# Patient Record
Sex: Male | Born: 1955 | Race: White | Hispanic: No | Smoking: Never smoker
Health system: Southern US, Community
[De-identification: ages and names within clinical notes are randomized; demographics above are authoritative.]

## PROBLEM LIST (undated history)

## (undated) DIAGNOSIS — N189 Chronic kidney disease, unspecified: Secondary | ICD-10-CM

## (undated) DIAGNOSIS — I2510 Atherosclerotic heart disease of native coronary artery without angina pectoris: Secondary | ICD-10-CM

## (undated) DIAGNOSIS — I10 Essential (primary) hypertension: Secondary | ICD-10-CM

## (undated) DIAGNOSIS — D649 Anemia, unspecified: Secondary | ICD-10-CM

## (undated) DIAGNOSIS — F909 Attention-deficit hyperactivity disorder, unspecified type: Secondary | ICD-10-CM

## (undated) DIAGNOSIS — G4733 Obstructive sleep apnea (adult) (pediatric): Secondary | ICD-10-CM

## (undated) DIAGNOSIS — M109 Gout, unspecified: Secondary | ICD-10-CM

## (undated) DIAGNOSIS — J984 Other disorders of lung: Secondary | ICD-10-CM

## (undated) DIAGNOSIS — M459 Ankylosing spondylitis of unspecified sites in spine: Secondary | ICD-10-CM

## (undated) DIAGNOSIS — H9190 Unspecified hearing loss, unspecified ear: Secondary | ICD-10-CM

## (undated) DIAGNOSIS — E785 Hyperlipidemia, unspecified: Secondary | ICD-10-CM

## (undated) DIAGNOSIS — I219 Acute myocardial infarction, unspecified: Secondary | ICD-10-CM

## (undated) DIAGNOSIS — M47819 Spondylosis without myelopathy or radiculopathy, site unspecified: Secondary | ICD-10-CM

## (undated) DIAGNOSIS — F32A Depression, unspecified: Secondary | ICD-10-CM

## (undated) HISTORY — PX: ANTERIOR FUSION CERVICAL SPINE: SUR626

## (undated) HISTORY — PX: KNEE ARTHROSCOPY: SHX127

## (undated) HISTORY — PX: POSTERIOR LUMBAR FUSION: SHX6036

## (undated) HISTORY — PX: TONSILLECTOMY: SUR1361

## (undated) HISTORY — PX: BACK SURGERY: SHX140

## (undated) HISTORY — PX: LAPAROSCOPIC CHOLECYSTECTOMY: SUR755

## (undated) HISTORY — PX: INGUINAL HERNIA REPAIR: SUR1180

---

## 2003-09-19 HISTORY — PX: NASAL SINUS SURGERY: SHX719

## 2015-02-08 ENCOUNTER — Other Ambulatory Visit: Payer: Self-pay | Admitting: Surgery

## 2015-03-02 NOTE — Pre-Procedure Instructions (Signed)
    Bill [name]  03/02/2015     No Pharmacies Listed   Your procedure is scheduled on Thursday, March 11, 2015 at 11:30.   Report to [facility] Admitting at 9:30 A.M.  Call this number if you have problems the morning of surgery:  [phone]   Remember:  Do not eat food or drink liquids after midnight.  Take these medicines the morning of surgery with A SIP OF WATER Amlodipine (Norvasc), Azelastine (Astelin), Loratadine (Claritin).   Stop taking Aspirin, Coumadin, Plavix, Effient, Herbal medications, Aleve, Naproxen and Ibuprofen 5 days prior to surgery.   Do not wear jewelry.  Do not wear lotions, powders, or colognes.  You may wear deodorant.  Do not shave 48 hours prior to surgery.  Men may shave face and neck.  Do not bring valuables to the hospital.  [facility] is not responsible for any belongings or valuables.  Contacts, dentures or bridgework may not be worn into surgery.  Leave your suitcase in the car.  After surgery it may be brought to your room.  For patients admitted to the hospital, discharge time will be determined by your treatment team.  Patients discharged the day of surgery will not be allowed to drive home.   Special instructions:  See attached  Please read over the following fact sheets that you were given. Pain Booklet, Coughing and Deep Breathing and Surgical Site Infection Prevention

## 2015-03-03 ENCOUNTER — Encounter (HOSPITAL_COMMUNITY)
Admission: RE | Admit: 2015-03-03 | Discharge: 2015-03-03 | Disposition: A | Discharge status: Home / Self Care | Payer: Non-veteran care | Source: Ambulatory Visit | Attending: Surgery | Admitting: Surgery

## 2015-03-03 ENCOUNTER — Encounter (HOSPITAL_COMMUNITY): Payer: Self-pay

## 2015-03-03 DIAGNOSIS — Z01818 Encounter for other preprocedural examination: Secondary | ICD-10-CM | POA: Insufficient documentation

## 2015-03-03 HISTORY — DX: Spondylosis without myelopathy or radiculopathy, site unspecified: M47.819

## 2015-03-03 HISTORY — DX: Unspecified hearing loss, unspecified ear: H91.90

## 2015-03-03 HISTORY — DX: Other disorders of lung: J98.4

## 2015-03-03 HISTORY — DX: Essential (primary) hypertension: I10

## 2015-03-03 HISTORY — DX: Attention-deficit hyperactivity disorder, unspecified type: F90.9

## 2015-03-03 LAB — BASIC METABOLIC PANEL
ANION GAP: 8 (ref 5–15)
BUN: 16 mg/dL (ref 6–20)
CHLORIDE: 100 mmol/L — AB (ref 101–111)
CO2: 29 mmol/L (ref 22–32)
CREATININE: 1.23 mg/dL (ref 0.61–1.24)
Calcium: 9.2 mg/dL (ref 8.9–10.3)
GFR calc Af Amer: >60 mL/min (ref >60)
GFR calc non Af Amer: >60 mL/min (ref >60)
Glucose, Bld: 121 mg/dL — ABNORMAL HIGH (ref 65–99)
Potassium: 4.1 mmol/L (ref 3.5–5.1)
Sodium: 137 mmol/L (ref 135–145)

## 2015-03-03 LAB — CBC
HCT: 45.9 % (ref 39.0–52.0)
HEMOGLOBIN: 15.2 g/dL (ref 13.0–17.0)
MCH: 30 pg (ref 26.0–34.0)
MCHC: 33.1 g/dL (ref 30.0–36.0)
MCV: 90.7 fL (ref 78.0–100.0)
Platelets: 320 10*3/uL (ref 150–400)
RBC: 5.06 MIL/uL (ref 4.22–5.81)
RDW: 14.2 % (ref 11.5–15.5)
WBC: 9 10*3/uL (ref 4.0–10.5)

## 2015-03-03 NOTE — Progress Notes (Signed)
PCP- [facility]  Cardiologist- denies  CXR - requested from [facility]  EKG- 03/03/2015  Echo- denies  Cardiac Cath- denies

## 2015-03-10 MED ORDER — CEFAZOLIN SODIUM 10 G IJ SOLR
3.0000 g | INTRAMUSCULAR | Status: AC
  Administered 2015-03-11: 3 g via INTRAVENOUS
  Filled 2015-03-10: qty 3000

## 2015-03-10 NOTE — H&P (Signed)
Ethel Veronica 02/08/2015 9:00 AM Location: [facility] Patient #: [id number] DOB: 04-May-1956 Married / Language: [place] / Race: White Male  History of Present Illness Riley Lam A. Magnus Ivan MD; 02/08/2015 9:22 AM) Patient words: BIH.  The patient is a 59 year old male who presents for an evaluation of a hernia. This is a very pleasant gentleman referred by Dr. Benjiman Core at the [place], [place] for evaluation of multiple hernias. He reports that he has had recurrence of his bilateral inguinal hernias which were repaired back in the late 1990s. He has also had a hernia develop at an umbilical incision after laparoscopic cholecystectomy. He has had no obstructive symptoms but is having intermittent sharp pain in both his groins with activity. He reports that the hernia is always easily reduced. He is otherwise without complaints.   Other Problems Lamar Laundry Bynum, CMA; 02/08/2015 9:00 AM) Arthritis Back Pain Cholelithiasis High blood pressure Inguinal Hernia Other disease, cancer, significant illness Sleep Apnea  Past Surgical History Gilmer Mor, CMA; 02/08/2015 9:00 AM) Gallbladder Surgery - Laparoscopic Knee Surgery Left. Laparoscopic Inguinal Hernia Surgery Bilateral. Spinal Surgery - Lower Back Spinal Surgery - Neck Tonsillectomy Vasectomy  Diagnostic Studies History Gilmer Mor, CMA; 02/08/2015 9:00 AM) Colonoscopy 1-5 years ago  Allergies Lamar Laundry Bynum, CMA; 02/08/2015 9:01 AM) No Known Drug Allergies05/23/2016  Medication History (Sonya Bynum, CMA; 02/08/2015 9:03 AM) Singulair (  Tablet, Oral) Active. Norvasc (  Tablet, Oral) Active. Methotrexate (Arthritis) (2.5MG  Tablet, Oral) Active. Zestoretic (20-12.5MG  Tablet, Oral) Active. Enbrel ( /ML Soln Pref Syr, Subcutaneous) Active. Vitamin D3 (100000 UNIT/GM Powder,) Active. Folic + B12 (800-1000MCG Tablet, Oral) Active. Loratadine (  Tablet, Oral)  Active. Azelastine HCl (0.1% Solution, Nasal) Active. Testosterone Cypionate ( /ML Solution, Intramuscular) Active. Medications Reconciled  Social History Gilmer Mor, CMA; 02/08/2015 9:00 AM) Alcohol use Occasional alcohol use. Caffeine use Carbonated beverages, Coffee, Tea. No drug use Tobacco use Never smoker.  Family History Gilmer Mor, CMA; 02/08/2015 9:00 AM) Cancer Father. Heart Disease Brother. Heart disease in male family member before age 30 Hypertension Brother, Mother.  Review of Systems (Sonya Bynum CMA; 02/08/2015 9:00 AM) General Present- Fatigue and Weight Gain. Not Present- Appetite Loss, Chills, Fever, Night Sweats and Weight Loss. Skin Not Present- Change in Wart/Mole, Dryness, Hives, Jaundice, New Lesions, Non-Healing Wounds, Rash and Ulcer. HEENT Present- Hearing Loss, Ringing in the Ears and Seasonal Allergies. Not Present- Earache, Hoarseness, Nose Bleed, Oral Ulcers, Sinus Pain, Sore Throat, Visual Disturbances, Wears glasses/contact lenses and Yellow Eyes. Respiratory Present- Snoring. Not Present- Bloody sputum, Chronic Cough, Difficulty Breathing and Wheezing. Breast Not Present- Breast Mass, Breast Pain, Nipple Discharge and Skin Changes. Cardiovascular Present- Difficulty Breathing Lying Down, Shortness of Breath and Swelling of Extremities. Not Present- Chest Pain, Leg Cramps, Palpitations and Rapid Heart Rate. Gastrointestinal Present- Difficulty Swallowing. Not Present- Abdominal Pain, Bloating, Bloody Stool, Change in Bowel Habits, Chronic diarrhea, Constipation, Excessive gas, Gets full quickly at meals, Hemorrhoids, Indigestion, Nausea, Rectal Pain and Vomiting. Male Genitourinary Not Present- Blood in Urine, Change in Urinary Stream, Frequency, Impotence, Nocturia, Painful Urination, Urgency and Urine Leakage. Musculoskeletal Present- Joint Pain. Not Present- Back Pain, Joint Stiffness, Muscle Pain, Muscle Weakness and Swelling of  Extremities. Neurological Not Present- Decreased Memory, Fainting, Headaches, Numbness, Seizures, Tingling, Tremor, Trouble walking and Weakness. Psychiatric Not Present- Anxiety, Bipolar, Change in Sleep Pattern, Depression, Fearful and Frequent crying. Endocrine Not Present- Cold Intolerance, Excessive Hunger, Hair Changes, Heat Intolerance, Hot flashes and New Diabetes. Hematology Not Present- Easy Bruising, Excessive bleeding, Gland problems, HIV and Persistent  Infections.   Vitals (Sonya Bynum CMA; 02/08/2015 9:00 AM) 02/08/2015 9:00 AM Weight: 314 lb Height: 70in Body Surface Area: 2.65 m Body Mass Index: 45.05 kg/m Temp.: 97.66F(Temporal)  Pulse: 75 (Regular)  BP: 134/78 (Sitting, Left Arm, Standard)    Physical Exam (Jozsef Wescoat A. Magnus Ivan MD; 02/08/2015 9:23 AM) General Mental Status-Alert. General Appearance-Consistent with stated age. Hydration-Well hydrated. Voice-Normal. Note: Morbidly obese   Head and Neck Head-normocephalic, atraumatic with no lesions or palpable masses. Trachea-midline.  Eye Eyeball - Bilateral-Extraocular movements intact. Sclera/Conjunctiva - Bilateral-No scleral icterus.  Chest and Lung Exam Chest and lung exam reveals -quiet, even and easy respiratory effort with no use of accessory muscles and on auscultation, normal breath sounds, no adventitious sounds and normal vocal resonance. Inspection Chest Wall - Normal. Back - normal.  Cardiovascular Cardiovascular examination reveals -normal heart sounds, regular rate and rhythm with no murmurs and normal pedal pulses bilaterally. Note: Bilateral pedal edema is present  Abdomen Inspection Skin - Scar - no surgical scars. Hernias - Ventral - Reducible. Note: The ventral hernia is a small hernia at the umbilicus through her previous scar. Inguinal hernia - Left - Reducible. Right - Reducible. Palpation/Percussion Palpation and Percussion of the abdomen reveal -  Soft, Non Tender, No Rebound tenderness, No Rigidity (guarding) and No hepatosplenomegaly. Auscultation Auscultation of the abdomen reveals - Bowel sounds normal.  Neurologic Neurologic evaluation reveals -alert and oriented x 3 with no impairment of recent or remote memory. Mental Status-Normal.  Musculoskeletal Normal Exam - Left-Upper Extremity Strength Normal and Lower Extremity Strength Normal. Normal Exam - Right-Upper Extremity Strength Normal, Lower Extremity Weakness.    Assessment & Plan (Dimarco Minkin A. 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 Repair of all hernias is recommended with mesh. I will attempt to repair the inguinal hernias laparoscopically mesh and then the incisional hernia of the umbilicus open with mesh. I discussed the risk of him in detail. These include but are not limited to bleeding, infection, injury to his riding structures, nerve entrapment, chronic pain, the need to convert to an open procedure, recurrence, DVT, cardiopulmonary issues, postoperative recovery, etc. He understands and wishes to proceed. He will need to be observed postoperatively in the hospital overnight given his restrictive lung disease

## 2015-03-11 ENCOUNTER — Encounter (HOSPITAL_COMMUNITY)
Admission: RE | Disposition: A | Discharge status: Home / Self Care | Payer: Self-pay | Source: Ambulatory Visit | Attending: Surgery

## 2015-03-11 ENCOUNTER — Ambulatory Visit (HOSPITAL_COMMUNITY): Payer: No Typology Code available for payment source | Admitting: Anesthesiology

## 2015-03-11 ENCOUNTER — Observation Stay (HOSPITAL_COMMUNITY)
Admission: RE | Admit: 2015-03-11 | Discharge: 2015-03-12 | Disposition: A | Discharge status: Home / Self Care | Payer: No Typology Code available for payment source | Source: Ambulatory Visit | Attending: Surgery | Admitting: Surgery

## 2015-03-11 ENCOUNTER — Encounter (HOSPITAL_COMMUNITY): Payer: Self-pay | Admitting: Anesthesiology

## 2015-03-11 DIAGNOSIS — G4730 Sleep apnea, unspecified: Secondary | ICD-10-CM | POA: Insufficient documentation

## 2015-03-11 DIAGNOSIS — K4020 Bilateral inguinal hernia, without obstruction or gangrene, not specified as recurrent: Secondary | ICD-10-CM | POA: Diagnosis present

## 2015-03-11 DIAGNOSIS — K4000 Bilateral inguinal hernia, with obstruction, without gangrene, not specified as recurrent: Principal | ICD-10-CM | POA: Insufficient documentation

## 2015-03-11 DIAGNOSIS — J449 Chronic obstructive pulmonary disease, unspecified: Secondary | ICD-10-CM | POA: Diagnosis not present

## 2015-03-11 DIAGNOSIS — M1990 Unspecified osteoarthritis, unspecified site: Secondary | ICD-10-CM | POA: Diagnosis not present

## 2015-03-11 DIAGNOSIS — I10 Essential (primary) hypertension: Secondary | ICD-10-CM | POA: Insufficient documentation

## 2015-03-11 DIAGNOSIS — Z6841 Body Mass Index (BMI) 40.0 and over, adult: Secondary | ICD-10-CM | POA: Insufficient documentation

## 2015-03-11 DIAGNOSIS — K432 Incisional hernia without obstruction or gangrene: Secondary | ICD-10-CM | POA: Diagnosis not present

## 2015-03-11 HISTORY — PX: HERNIA REPAIR: SHX51

## 2015-03-11 HISTORY — PX: INGUINAL HERNIA REPAIR: SHX194

## 2015-03-11 HISTORY — PX: INCISIONAL HERNIA REPAIR: SHX193

## 2015-03-11 HISTORY — DX: Obstructive sleep apnea (adult) (pediatric): G47.33

## 2015-03-11 HISTORY — DX: Ankylosing spondylitis of unspecified sites in spine: M45.9

## 2015-03-11 SURGERY — REPAIR, HERNIA, INGUINAL, BILATERAL, LAPAROSCOPIC
Anesthesia: General | Site: Abdomen

## 2015-03-11 MED ORDER — MEPERIDINE HCL 25 MG/ML IJ SOLN: 6.2500 mg | INTRAMUSCULAR | Status: DC | PRN

## 2015-03-11 MED ORDER — MIDAZOLAM HCL 5 MG/5ML IJ SOLN
INTRAMUSCULAR | Status: DC | PRN
  Administered 2015-03-11: 2 mg via INTRAVENOUS

## 2015-03-11 MED ORDER — MORPHINE SULFATE 2 MG/ML IJ SOLN
1.0000 mg | INTRAMUSCULAR | Status: DC | PRN
  Administered 2015-03-11: 4 mg via INTRAVENOUS
  Administered 2015-03-11: 2 mg via INTRAVENOUS
  Administered 2015-03-12 (×2): 4 mg via INTRAVENOUS
  Filled 2015-03-11: qty 1
  Filled 2015-03-11 (×3): qty 2

## 2015-03-11 MED ORDER — SUGAMMADEX SODIUM 200 MG/2ML IV SOLN
INTRAVENOUS | Status: DC | PRN
  Administered 2015-03-11: 200 mg via INTRAVENOUS

## 2015-03-11 MED ORDER — POTASSIUM CHLORIDE IN NACL 20-0.9 MEQ/L-% IV SOLN
INTRAVENOUS | Status: DC
Start: 2015-03-11 — End: 2015-03-12
  Administered 2015-03-11: 19:00:00 via INTRAVENOUS
  Filled 2015-03-11: qty 1000

## 2015-03-11 MED ORDER — 0.9 % SODIUM CHLORIDE (POUR BTL) OPTIME
TOPICAL | Status: DC | PRN
  Administered 2015-03-11: 1000 mL

## 2015-03-11 MED ORDER — ONDANSETRON HCL 4 MG/2ML IJ SOLN: 4.0000 mg | Freq: Four times a day (QID) | INTRAMUSCULAR | Status: DC | PRN

## 2015-03-11 MED ORDER — BUPIVACAINE HCL 0.5 % IJ SOLN
INTRAMUSCULAR | Status: AC
  Filled 2015-03-11: qty 1

## 2015-03-11 MED ORDER — ONDANSETRON HCL 4 MG/2ML IJ SOLN
INTRAMUSCULAR | Status: DC | PRN
  Administered 2015-03-11 (×2): 4 mg via INTRAVENOUS

## 2015-03-11 MED ORDER — FENTANYL CITRATE (PF) 100 MCG/2ML IJ SOLN
INTRAMUSCULAR | Status: DC | PRN
  Administered 2015-03-11: 100 ug via INTRAVENOUS
  Administered 2015-03-11: 50 ug via INTRAVENOUS

## 2015-03-11 MED ORDER — ENOXAPARIN SODIUM 40 MG/0.4ML ~~LOC~~ SOLN
40.0000 mg | SUBCUTANEOUS | Status: DC
  Filled 2015-03-11: qty 0.4

## 2015-03-11 MED ORDER — BUPIVACAINE HCL (PF) 0.25 % IJ SOLN
INTRAMUSCULAR | Status: AC
  Filled 2015-03-11: qty 30

## 2015-03-11 MED ORDER — BUPIVACAINE-EPINEPHRINE (PF) 0.25% -1:200000 IJ SOLN
INTRAMUSCULAR | Status: AC
  Filled 2015-03-11: qty 30

## 2015-03-11 MED ORDER — HYDROMORPHONE HCL 1 MG/ML IJ SOLN
INTRAMUSCULAR | Status: AC
  Filled 2015-03-11: qty 1

## 2015-03-11 MED ORDER — LIDOCAINE HCL (CARDIAC) 20 MG/ML IV SOLN
INTRAVENOUS | Status: AC
  Filled 2015-03-11: qty 5

## 2015-03-11 MED ORDER — OXYCODONE-ACETAMINOPHEN 5-325 MG PO TABS: 1.0000 | ORAL_TABLET | ORAL | Status: DC | PRN

## 2015-03-11 MED ORDER — PROPOFOL 10 MG/ML IV BOLUS
INTRAVENOUS | Status: DC | PRN
  Administered 2015-03-11: 200 mg via INTRAVENOUS

## 2015-03-11 MED ORDER — DEXAMETHASONE SODIUM PHOSPHATE 4 MG/ML IJ SOLN
INTRAMUSCULAR | Status: DC | PRN
  Administered 2015-03-11: 8 mg via INTRAVENOUS

## 2015-03-11 MED ORDER — ROCURONIUM BROMIDE 50 MG/5ML IV SOLN
INTRAVENOUS | Status: AC
  Filled 2015-03-11: qty 1

## 2015-03-11 MED ORDER — BUPIVACAINE HCL (PF) 0.5 % IJ SOLN
INTRAMUSCULAR | Status: DC | PRN
  Administered 2015-03-11: 50 mL

## 2015-03-11 MED ORDER — HYDROMORPHONE HCL 1 MG/ML IJ SOLN
0.2500 mg | INTRAMUSCULAR | Status: DC | PRN
  Administered 2015-03-11 (×2): 0.5 mg via INTRAVENOUS

## 2015-03-11 MED ORDER — PROMETHAZINE HCL 25 MG/ML IJ SOLN: 6.2500 mg | INTRAMUSCULAR | Status: DC | PRN

## 2015-03-11 MED ORDER — AZELASTINE HCL 0.1 % NA SOLN
1.0000 | Freq: Two times a day (BID) | NASAL | Status: DC
  Filled 2015-03-11: qty 30

## 2015-03-11 MED ORDER — ONDANSETRON HCL 4 MG PO TABS: 4.0000 mg | ORAL_TABLET | Freq: Four times a day (QID) | ORAL | Status: DC | PRN

## 2015-03-11 MED ORDER — LIDOCAINE HCL (CARDIAC) 20 MG/ML IV SOLN
INTRAVENOUS | Status: DC | PRN
  Administered 2015-03-11: 20 mg via INTRAVENOUS

## 2015-03-11 MED ORDER — HYDROCHLOROTHIAZIDE 12.5 MG PO CAPS: 12.5000 mg | ORAL_CAPSULE | Freq: Every day | ORAL | Status: DC

## 2015-03-11 MED ORDER — PHENYLEPHRINE HCL 10 MG/ML IJ SOLN
10.0000 mg | INTRAVENOUS | Status: DC | PRN
  Administered 2015-03-11: 20 ug/min via INTRAVENOUS

## 2015-03-11 MED ORDER — FENTANYL CITRATE (PF) 250 MCG/5ML IJ SOLN
INTRAMUSCULAR | Status: AC
  Filled 2015-03-11: qty 5

## 2015-03-11 MED ORDER — PHENYLEPHRINE HCL 10 MG/ML IJ SOLN
INTRAMUSCULAR | Status: DC | PRN
  Administered 2015-03-11: 120 ug via INTRAVENOUS
  Administered 2015-03-11 (×3): 80 ug via INTRAVENOUS

## 2015-03-11 MED ORDER — ONDANSETRON HCL 4 MG/2ML IJ SOLN
INTRAMUSCULAR | Status: AC
  Filled 2015-03-11: qty 2

## 2015-03-11 MED ORDER — MIDAZOLAM HCL 2 MG/2ML IJ SOLN
0.5000 mg | Freq: Once | INTRAMUSCULAR | Status: DC | PRN
Start: 2015-03-11 — End: 2015-03-11

## 2015-03-11 MED ORDER — PROPOFOL 10 MG/ML IV BOLUS
INTRAVENOUS | Status: AC
  Filled 2015-03-11: qty 20

## 2015-03-11 MED ORDER — MONTELUKAST SODIUM 10 MG PO TABS
10.0000 mg | ORAL_TABLET | Freq: Every day | ORAL | Status: DC
  Filled 2015-03-11: qty 1

## 2015-03-11 MED ORDER — MIDAZOLAM HCL 2 MG/2ML IJ SOLN
INTRAMUSCULAR | Status: AC
  Filled 2015-03-11: qty 2

## 2015-03-11 MED ORDER — ROCURONIUM BROMIDE 100 MG/10ML IV SOLN
INTRAVENOUS | Status: DC | PRN
  Administered 2015-03-11: 50 mg via INTRAVENOUS
  Administered 2015-03-11 (×2): 15 mg via INTRAVENOUS

## 2015-03-11 MED ORDER — LISINOPRIL-HYDROCHLOROTHIAZIDE 20-12.5 MG PO TABS: 1.0000 | ORAL_TABLET | Freq: Every day | ORAL | Status: DC

## 2015-03-11 MED ORDER — SUGAMMADEX SODIUM 200 MG/2ML IV SOLN
INTRAVENOUS | Status: AC
  Filled 2015-03-11: qty 2

## 2015-03-11 MED ORDER — LACTATED RINGERS IV SOLN
INTRAVENOUS | Status: DC
  Administered 2015-03-11 (×2): via INTRAVENOUS
  Administered 2015-03-11: 50 mL/h via INTRAVENOUS

## 2015-03-11 MED ORDER — DEXAMETHASONE SODIUM PHOSPHATE 4 MG/ML IJ SOLN
INTRAMUSCULAR | Status: AC
  Filled 2015-03-11: qty 2

## 2015-03-11 MED ORDER — AMLODIPINE BESYLATE 5 MG PO TABS
5.0000 mg | ORAL_TABLET | Freq: Every day | ORAL | Status: DC
  Administered 2015-03-11: 5 mg via ORAL
  Filled 2015-03-11: qty 1

## 2015-03-11 MED ORDER — LISINOPRIL 20 MG PO TABS: 20.0000 mg | ORAL_TABLET | Freq: Every day | ORAL | Status: DC

## 2015-03-11 SURGICAL SUPPLY — 57 items
BENZOIN TINCTURE PRP APPL 2/3 (GAUZE/BANDAGES/DRESSINGS) ×5 IMPLANT
BLADE SURG ROTATE 9660 (MISCELLANEOUS) ×5 IMPLANT
CANISTER SUCTION 2500CC (MISCELLANEOUS) ×5 IMPLANT
CHLORAPREP W/TINT 26ML (MISCELLANEOUS) ×5 IMPLANT
CLOSURE WOUND 1/2 X4 (GAUZE/BANDAGES/DRESSINGS) ×2
COVER SURGICAL LIGHT HANDLE (MISCELLANEOUS) ×5 IMPLANT
DEVICE SECURE STRAP 25 ABSORB (INSTRUMENTS) ×5 IMPLANT
DISSECT BALLN SPACEMKR + OVL (BALLOONS) ×10
DISSECTOR BALLN SPACEMKR + OVL (BALLOONS) ×6 IMPLANT
DRAIN PENROSE 1/2X12 LTX STRL (WOUND CARE) ×5 IMPLANT
DRAPE LAPAROSCOPIC ABDOMINAL (DRAPES) ×5 IMPLANT
DRAPE UTILITY XL STRL (DRAPES) ×10 IMPLANT
DRSG TEGADERM 4X4.75 (GAUZE/BANDAGES/DRESSINGS) ×5 IMPLANT
ELECT CAUTERY BLADE 6.4 (BLADE) ×5 IMPLANT
ELECT REM PT RETURN 9FT ADLT (ELECTROSURGICAL) ×5
ELECTRODE REM PT RTRN 9FT ADLT (ELECTROSURGICAL) ×3 IMPLANT
GAUZE SPONGE 4X4 12PLY STRL (GAUZE/BANDAGES/DRESSINGS) ×5 IMPLANT
GLOVE BIO SURGEON STRL SZ7.5 (GLOVE) ×10 IMPLANT
GLOVE BIOGEL PI IND STRL 7.5 (GLOVE) ×9 IMPLANT
GLOVE BIOGEL PI INDICATOR 7.5 (GLOVE) ×6
GLOVE SURG SIGNA 7.5 PF LTX (GLOVE) ×5 IMPLANT
GOWN STRL REUS W/ TWL LRG LVL3 (GOWN DISPOSABLE) ×9 IMPLANT
GOWN STRL REUS W/ TWL XL LVL3 (GOWN DISPOSABLE) ×3 IMPLANT
GOWN STRL REUS W/TWL LRG LVL3 (GOWN DISPOSABLE) ×6
GOWN STRL REUS W/TWL XL LVL3 (GOWN DISPOSABLE) ×2
KIT BASIN OR (CUSTOM PROCEDURE TRAY) ×5 IMPLANT
KIT ROOM TURNOVER OR (KITS) ×5 IMPLANT
LIQUID BAND (GAUZE/BANDAGES/DRESSINGS) ×5 IMPLANT
MESH PARIETEX PROGRIP LEFT (Mesh General) ×5 IMPLANT
MESH PARIETEX PROGRIP RIGHT (Mesh General) ×5 IMPLANT
MESH VENTRALEX ST 2.5 CRC MED (Mesh General) ×5 IMPLANT
NEEDLE HYPO 25GX1X1/2 BEV (NEEDLE) ×5 IMPLANT
NEEDLE INSUFFLATION 14GA 120MM (NEEDLE) ×5 IMPLANT
NS IRRIG 1000ML POUR BTL (IV SOLUTION) ×5 IMPLANT
PACK GENERAL/GYN (CUSTOM PROCEDURE TRAY) ×5 IMPLANT
PAD ARMBOARD 7.5X6 YLW CONV (MISCELLANEOUS) ×5 IMPLANT
PENCIL BUTTON HOLSTER BLD 10FT (ELECTRODE) ×5 IMPLANT
SET TROCAR LAP APPLE-HUNT 5MM (ENDOMECHANICALS) ×5 IMPLANT
SPONGE GAUZE 4X4 12PLY STER LF (GAUZE/BANDAGES/DRESSINGS) ×5 IMPLANT
STRIP CLOSURE SKIN 1/2X4 (GAUZE/BANDAGES/DRESSINGS) ×8 IMPLANT
SUT MNCRL AB 4-0 PS2 18 (SUTURE) ×10 IMPLANT
SUT NOVA NAB DX-16 0-1 5-0 T12 (SUTURE) ×10 IMPLANT
SUT VIC AB 2-0 CT1 27 (SUTURE) ×10
SUT VIC AB 2-0 CT1 TAPERPNT 27 (SUTURE) ×15 IMPLANT
SUT VIC AB 3-0 SH 27 (SUTURE) ×2
SUT VIC AB 3-0 SH 27XBRD (SUTURE) ×3 IMPLANT
SYR CONTROL 10ML LL (SYRINGE) ×5 IMPLANT
TAPE CLOTH SURG 4X10 WHT LF (GAUZE/BANDAGES/DRESSINGS) ×5 IMPLANT
TOWEL OR 17X24 6PK STRL BLUE (TOWEL DISPOSABLE) ×5 IMPLANT
TOWEL OR 17X26 10 PK STRL BLUE (TOWEL DISPOSABLE) ×5 IMPLANT
TRAY FOLEY CATH 14FRSI W/METER (CATHETERS) ×5 IMPLANT
TRAY FOLEY CATH 16FR SILVER (SET/KITS/TRAYS/PACK) ×5 IMPLANT
TRAY LAPAROSCOPIC (CUSTOM PROCEDURE TRAY) ×5 IMPLANT
TUBE CONNECTING 12'X1/4 (SUCTIONS) ×1
TUBE CONNECTING 12X1/4 (SUCTIONS) ×4 IMPLANT
TUBING INSUFFLATION (TUBING) ×5 IMPLANT
YANKAUER SUCT BULB TIP NO VENT (SUCTIONS) ×5 IMPLANT

## 2015-03-11 NOTE — Anesthesia Postprocedure Evaluation (Signed)
  Anesthesia Post-op Note  Patient: Bill [name]  Procedure(s) Performed: Procedure(s): LAPAROSCOPIC CONVERTED TO BILATERAL INGUINAL HERNIA REPAIR WITH MESH (Bilateral) OPEN INCISIONAL HERNIA REPAIR WITH MESH  (N/A)  Patient Location: PACU  Anesthesia Type:General  Level of Consciousness: awake, alert , oriented and patient cooperative  Airway and Oxygen Therapy: Patient Spontanous Breathing and Patient connected to nasal cannula oxygen  Post-op Pain: mild  Post-op Assessment: Post-op Vital signs reviewed, Patient's Cardiovascular Status Stable, Respiratory Function Stable, Patent Airway, No signs of Nausea or vomiting and Pain level controlled              Post-op Vital Signs: Reviewed and stable  Last Vitals:  Filed Vitals:   03/11/15 1415  BP: 110/52  Pulse: 73  Temp:   Resp: 17    Complications: No apparent anesthesia complications

## 2015-03-11 NOTE — Anesthesia Procedure Notes (Signed)
Date/Time: 03/11/2015 10:25 AM Performed by: Jeani Hawking Pre-anesthesia Checklist: Patient identified, Timeout performed, Emergency Drugs available, Suction available and Patient being monitored Patient Re-evaluated:Patient Re-evaluated prior to inductionOxygen Delivery Method: Circle system utilized Preoxygenation: Pre-oxygenation with 100% oxygen Intubation Type: IV induction Ventilation: Two handed mask ventilation required and Oral airway inserted - appropriate to patient size Laryngoscope Size: Glidescope (1st attempt with MAC 4 grade III view, GRADE II with glidescope) Grade View: Grade III Tube type: Oral Tube size: 7.5 mm Number of attempts: 3 Airway Equipment and Method: Bougie stylet,  Video-laryngoscopy and Stylet Placement Confirmation: ETT inserted through vocal cords under direct vision,  breath sounds checked- equal and bilateral,  positive ETCO2 and CO2 detector Secured at: 23 cm Tube secured with: Tape Dental Injury: Teeth and Oropharynx as per pre-operative assessment  Difficulty Due To: Difficulty was unanticipated, Difficult Airway- due to immobile epiglottis and Difficult Airway- due to reduced neck mobility Comments: Grade III view with MAC 3, Dr. Jean Rosenthal attempted with MAC 3 and Blue bougie, unable to pass. ETT placed on 3rd attempt by Dr. Jean Rosenthal with glidescope. Easy mask with oral airway and 2 hand mask.tb

## 2015-03-11 NOTE — Anesthesia Preprocedure Evaluation (Addendum)
Anesthesia Evaluation  Patient identified by MRN, date of birth, ID band Patient awake    Reviewed: Allergy & Precautions, NPO status , Patient's Chart, lab work & pertinent test results  History of Anesthesia Complications Negative for: history of anesthetic complications  Airway Mallampati: II  TM Distance: >3 FB Neck ROM: Full    Dental  (+) Dental Advisory Given   Pulmonary sleep apnea and Continuous Positive Airway Pressure Ventilation , COPD COPD inhaler,  breath sounds clear to auscultation        Cardiovascular hypertension, Pt. on medications - anginaRhythm:Regular Rate:Normal     Neuro/Psych negative neurological ROS     GI/Hepatic negative GI ROS, Neg liver ROS,   Endo/Other  Morbid obesity  Renal/GU negative Renal ROS     Musculoskeletal  (+) Arthritis -,   Abdominal (+) + obese,   Peds  Hematology   Anesthesia Other Findings   Reproductive/Obstetrics                            Anesthesia Physical Anesthesia Plan  ASA: III  Anesthesia Plan: General   Post-op Pain Management:    Induction: Intravenous  Airway Management Planned: Oral ETT  Additional Equipment:   Intra-op Plan:   Post-operative Plan: Extubation in OR  Informed Consent: I have reviewed the patients History and Physical, chart, labs and discussed the procedure including the risks, benefits and alternatives for the proposed anesthesia with the patient or authorized representative who has indicated his/her understanding and acceptance.   Dental advisory given  Plan Discussed with: CRNA and Surgeon  Anesthesia Plan Comments: (Plan routine monitors, GETA)        Anesthesia Quick Evaluation

## 2015-03-11 NOTE — Op Note (Signed)
LAPAROSCOPIC CONVERTED TO BILATERAL INGUINAL HERNIA REPAIR WITH MESH, OPEN INCISIONAL HERNIA REPAIR WITH MESH   Procedure Note  DAMONEY WOZNY 03/11/2015   Pre-op Diagnosis: Bilateral Inguinal Hernias and Incisional Hernia     Post-op Diagnosis: same  Procedure(s): LAPAROSCOPIC CONVERTED TO BILATERAL INGUINAL HERNIA REPAIR WITH MESH OPEN INCISIONAL HERNIA REPAIR WITH MESH   Surgeon(s): Abigail Miyamoto, MD  Anesthesia: General  Staff:  Circulator: Netta Corrigan, RN Relief Circulator: Maureen Ralphs, RN Scrub Person: Duane Lope, RN; Maureen Ralphs, RN RN First Assistant: Maureen Ralphs, RN  Estimated Blood Loss: Minimal                         Bill [name]   Date: 03/11/2015  Time: 12:32 PM

## 2015-03-11 NOTE — Transfer of Care (Signed)
Immediate Anesthesia Transfer of Care Note  Patient: Bill [name]  Procedure(s) Performed: Procedure(s): LAPAROSCOPIC CONVERTED TO BILATERAL INGUINAL HERNIA REPAIR WITH MESH (Bilateral) OPEN INCISIONAL HERNIA REPAIR WITH MESH  (N/A)  Patient Location: PACU  Anesthesia Type:General  Level of Consciousness: awake, alert , oriented and patient cooperative  Airway & Oxygen Therapy: Patient Spontanous Breathing and Patient connected to nasal cannula oxygen  Post-op Assessment: Report given to RN and Post -op Vital signs reviewed and stable  Post vital signs: Reviewed and stable  Last Vitals:  Filed Vitals:   03/11/15 0939  BP: 135/63  Pulse: 85  Temp: 36.7 C  Resp: 18    Complications: No apparent anesthesia complications

## 2015-03-11 NOTE — Interval H&P Note (Signed)
History and Physical Interval Note:no change in H and P  03/11/2015 10:04 AM  Bill [name]  has presented today for surgery, with the diagnosis of Bilateral Inguinal Hernias and Incisional Hernia  The various methods of treatment have been discussed with the patient and family. After consideration of risks, benefits and other options for treatment, the patient has consented to  Procedure(s): LAPAROSCOPIC BILATERAL INGUINAL HERNIA REPAIR (Bilateral) OPEN INCISIONAL HERNIA REPAIR WITH MESH  (N/A) INSERTION OF MESH (N/A) as a surgical intervention .  The patient's history has been reviewed, patient examined, no change in status, stable for surgery.  I have reviewed the patient's chart and labs.  Questions were answered to the patient's satisfaction.     Ceira Hoeschen A

## 2015-03-12 ENCOUNTER — Encounter (HOSPITAL_COMMUNITY): Payer: Self-pay | Admitting: Surgery

## 2015-03-12 DIAGNOSIS — K4000 Bilateral inguinal hernia, with obstruction, without gangrene, not specified as recurrent: Secondary | ICD-10-CM | POA: Diagnosis not present

## 2015-03-12 LAB — CBC
HEMATOCRIT: 37.6 % — AB (ref 39.0–52.0)
Hemoglobin: 12.5 g/dL — ABNORMAL LOW (ref 13.0–17.0)
MCH: 30.4 pg (ref 26.0–34.0)
MCHC: 33.2 g/dL (ref 30.0–36.0)
MCV: 91.5 fL (ref 78.0–100.0)
Platelets: 334 10*3/uL (ref 150–400)
RBC: 4.11 MIL/uL — ABNORMAL LOW (ref 4.22–5.81)
RDW: 14 % (ref 11.5–15.5)
WBC: 12.8 10*3/uL — AB (ref 4.0–10.5)

## 2015-03-12 LAB — BASIC METABOLIC PANEL
Anion gap: 9 (ref 5–15)
BUN: 20 mg/dL (ref 6–20)
CALCIUM: 8.7 mg/dL — AB (ref 8.9–10.3)
CO2: 28 mmol/L (ref 22–32)
CREATININE: 1.21 mg/dL (ref 0.61–1.24)
Chloride: 102 mmol/L (ref 101–111)
GFR calc Af Amer: >60 mL/min (ref >60)
GLUCOSE: 127 mg/dL — AB (ref 65–99)
Potassium: 4.4 mmol/L (ref 3.5–5.1)
SODIUM: 139 mmol/L (ref 135–145)

## 2015-03-12 MED ORDER — HYDROCODONE-ACETAMINOPHEN 5-325 MG PO TABS: 1.0000 | ORAL_TABLET | ORAL | Status: DC | PRN

## 2015-03-12 NOTE — Progress Notes (Signed)
Patient ID: Bill [name], male   DOB: Feb 13, 1956, 59 y.o.   MRN: [id number]  Doing ok this morning Abdomen soft Incisions clean  Plan:  discharge

## 2015-03-12 NOTE — Discharge Summary (Signed)
Physician Discharge Summary  Patient ID: Bill [name] MRN: [id number] DOB/AGE: 57-Jul-1957 57 y.o.  Admit date: 03/11/2015 Discharge date: 03/12/2015  Admission Diagnoses:  Discharge Diagnoses:  Active Problems:   Bilateral inguinal hernia   Discharged Condition: good  Hospital Course: uneventful post op recovery.  Discharged home pod #1  Consults: None  Significant Diagnostic Studies:   Treatments: surgery: lap converted to bilateral open inguinal hernia repair with mesh, repair of incisional hernia at the umbilicus  Discharge Exam: Blood pressure 120/65, pulse 92, temperature 100.1 F (37.8 C), temperature source Oral, resp. rate 22, height 5\' 10"  (1.778 m), weight 143.473 kg (316 lb 4.8 oz), SpO2 99 %. General appearance: alert, cooperative and no distress Resp: clear to auscultation bilaterally Cardio: regular rate and rhythm, S1, S2 normal, no murmur, click, rub or gallop Incision/Wound:incisions clean  Disposition: Final discharge disposition not confirmed     Medication List    TAKE these medications        amLODipine 5 MG tablet  Commonly known as:  NORVASC  Take 5 mg by mouth daily.     azelastine 0.1 % nasal spray  Commonly known as:  ASTELIN  Place 1 spray into both nostrils 2 (two) times daily. Use in each nostril as directed     cholecalciferol 1000 UNITS tablet  Commonly known as:  VITAMIN D  Take 1,000 Units by mouth daily.     ENBREL 50 MG/ML injection  Generic drug:  etanercept  Inject 50 mg into the skin once a week.     folic acid 1 MG tablet  Commonly known as:  FOLVITE  Take 1 mg by mouth daily.     HYDROcodone-acetaminophen 5-325 MG per tablet  Commonly known as:  NORCO  Take 1-2 tablets by mouth every 4 (four) hours as needed.     lisinopril-hydrochlorothiazide 20-12.5 MG per tablet  Commonly known as:  PRINZIDE,ZESTORETIC  Take 1 tablet by mouth daily.     loratadine 10 MG tablet  Commonly known as:  CLARITIN  Take 10 mg  by mouth daily.     methotrexate 2.5 MG tablet  Commonly known as:  RHEUMATREX  Take 10 mg by mouth once a week. Caution:Chemotherapy. Protect from light.     montelukast 10 MG tablet  Commonly known as:  SINGULAIR  Take 10 mg by mouth at bedtime.     testosterone cypionate 200 MG/ML injection  Commonly known as:  DEPOTESTOSTERONE CYPIONATE  Inject 100 mg into the muscle every 28 (twenty-eight) days.           Follow-up Information    Follow up with [facility] A, MD In 3 weeks.   Specialty:  General Surgery   Contact information:   [address] [place] [address] [phone]       Signed: Shelly Rubenstein 03/12/2015, 6:46 AM

## 2015-03-12 NOTE — Discharge Instructions (Signed)
[facility] _______[facility]  UMBILICAL OR INGUINAL HERNIA REPAIR: POST OP INSTRUCTIONS  Always review your discharge instruction sheet given to you by the facility where your surgery was performed. IF YOU HAVE DISABILITY OR FAMILY LEAVE FORMS, YOU MUST BRING THEM TO THE OFFICE FOR PROCESSING.   DO NOT GIVE THEM TO YOUR DOCTOR.  1. A  prescription for pain medication may be given to you upon discharge.  Take your pain medication as prescribed, if needed.  If narcotic pain medicine is not needed, then you may take acetaminophen (Tylenol) or ibuprofen (Advil) as needed. 2. Take your usually prescribed medications unless otherwise directed. 3. If you need a refill on your pain medication, please contact your pharmacy.  They will contact our office to request authorization. Prescriptions will not be filled after 5 pm or on week-ends. 4. You should follow a light diet the first 24 hours after arrival home, such as soup and crackers, etc.  Be sure to include lots of fluids daily.  Resume your normal diet the day after surgery. 5. Most patients will experience some swelling and bruising around the umbilicus or in the groin and scrotum.  Ice packs and reclining will help.  Swelling and bruising can take several days to resolve.  6. It is common to experience some constipation if taking pain medication after surgery.  Increasing fluid intake and taking a stool softener (such as Colace) will usually help or prevent this problem from occurring.  A mild laxative (Milk of Magnesia or Miralax) should be taken according to package directions if there are no bowel movements after 48 hours. 7. Unless discharge instructions indicate otherwise, you may remove your bandages 24-48 hours after surgery, and you may shower at that time.  You may have steri-strips (small skin tapes) in place directly over the incision.  These strips should be left on the skin for 7-10 days.  If your surgeon used skin glue on the  incision, you may shower in 24 hours.  The glue will flake off over the next 2-3 weeks.  Any sutures or staples will be removed at the office during your follow-up visit. 8. ACTIVITIES:  You may resume regular (light) daily activities beginning the next day--such as daily self-care, walking, climbing stairs--gradually increasing activities as tolerated.  You may have sexual intercourse when it is comfortable.  Refrain from any heavy lifting or straining until approved by your doctor. a. You may drive when you are no longer taking prescription pain medication, you can comfortably wear a seatbelt, and you can safely maneuver your car and apply brakes. b. RETURN TO WORK:  __________________________________________________________ 9. You should see your doctor in the office for a follow-up appointment approximately 2-3 weeks after your surgery.  Make sure that you call for this appointment within a day or two after you arrive home to insure a convenient appointment time. 10. OTHER INSTRUCTIONS:  __________________________________________________________________________________________________________________________________________________________________________________________  WHEN TO CALL YOUR DOCTOR: 1. Fever over 101.0 2. Inability to urinate 3. Nausea and/or vomiting 4. Extreme swelling or bruising 5. Continued bleeding from incision. 6. Increased pain, redness, or drainage from the incision  The clinic staff is available to answer your questions during regular business hours.  Please don't hesitate to call and ask to speak to one of the nurses for clinical concerns.  If you have a medical emergency, go to the nearest emergency room or call 911.  A surgeon from [facility] is always on call at the hospital     [address], [address], [place], [place]  [address] ?  [address] [address], [place],    [address] [phone] ? [phone] ? FAX [phone] Web site:  [email or web address]  

## 2015-03-12 NOTE — Op Note (Signed)
NAMEJESSERAY, SZOKE NO.:  [id number]  MEDICAL RECORD NO.:  [id number]  LOCATION:  6N29C                        FACILITY:  [facility]  PHYSICIAN:  Abigail Miyamoto, M.D. DATE OF BIRTH:  Sep 14, 1956  DATE OF PROCEDURE:  03/11/2015 DATE OF DISCHARGE:                              OPERATIVE REPORT   PREOPERATIVE DIAGNOSIS:  Bilateral inguinal hernias, incisional hernia at the umbilicus.  POSTOPERATIVE DIAGNOSIS:  Bilateral inguinal hernias, incisional hernia at the umbilicus.  PROCEDURES: 1. Bilateral laparoscopic converted to open inguinal hernia repair     with mesh. 2. Open incisional hernia repair of the umbilicus with mesh.  SURGEON:  Abigail Miyamoto, M.D.  ANESTHESIA:  General and 0.5% Marcaine.  ESTIMATED BLOOD LOSS:  Minimal.  FINDINGS:  The patient was found to have bilateral direct inguinal hernias which were repaired with 2 separate pieces of Parietex ProGrip Proceed mesh.  The umbilicus was repaired with a 6.3 cm round ventral patch from Bard.  I was unable to complete the procedure laparoscopically and had to convert to an open procedure due to the inability to expand the preperitoneal space.  PROCEDURE IN DETAIL:  The patient was brought to the operating room, identified as Bill [name].  He was placed supine on the operating table and general anesthesia was induced.  Foley catheter was then inserted.  His abdomen was then prepped and draped in usual sterile fashion.  I made a small transverse incision below the umbilicus and took this down to fascia which was opened with scalpel.  I then elevated the rectus muscle.  I passed the dissecting balloon underneath the rectus muscle and manipulated toward the pubis.  The dissecting balloon was then dissected under direct vision.  It had actually gone between the muscle layers and some of the rectus muscle was below it, so I had to remove this dissecting balloon and place another 1 in order  to dissect down the preperitoneal space.  I then removed the dissecting balloon with insufflation of carbon dioxide.  I then placed two 5 mm ports placed in lower midline.  The preperitoneal space was surprisingly more fatty than expected.  He was morbidly obese gentleman.  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  I had a difficult time reducing the sac.  It did release to the peritoneal cavity after I decompressed this with a Veress needle, but preperitoneal space would not stay appropriately insufflated in order for me to complete the procedure laparoscopically safely.  Because of obstructed hernias, we made decision to convert to an open procedure.  I made 2 separate groin incisions on each side and took these down through Scarpa's fascia with electrocautery, then identified the external oblique fascia opened on both sides with the electrocautery.  I then identified the direct hernia defects.  I controlled both cord structures with Penrose drains and found no evidence of indirect hernia on either side.  I then imbricated both hernia sacs with 2-0 Vicryl sutures repairing the floor of the inguinal canal.  I then brought 2 separate pieces of Proceed mesh onto the field.  I placed a right side first in the inguinal floor and sewed in place with a 2-0 Vicryl suture.  I then closed the external oblique fascia over top of this with running 2-0 Vicryl suture.  I then anesthetized the wound thoroughly with Marcaine. I then closed the Scarpa fascia with interrupted 3-0 Vicryl sutures.  I then placed the left side with piece of mesh around the cord structures. The left inguinal area and on the left inguinal floor after imbricating the sac as well.  Again, good repair of the inguinal floor appeared to be achieved.  I then closed the external oblique fascia over top of this with running 2-0 Vicryl suture.  Scarpa's fascia closed with 3-0 Vicryl sutures.  I then turned  my attention towards the umbilicus.  The patient had a previous umbilical hernia repair that had apparently failed.  I was able to remove the hernia sac from the overlying umbilical skin.  I then opened the sac and reduced all the omentum back into the abdominal cavity.  I excised the sac in its entirety.  I brought a 6.3 cm round ventral patch onto the field.  This is from Bard.  I placed it through the fascial opening and pulled up against the peritoneum with stay ties. I then sewed it in place circumferentially with interrupted #1 Novafil sutures.  I then cut the stay ties and then closed the fascia over the top of the mesh with several figure-of-eight #1 Novafil sutures as well. I then closed the small fascial defect below this from the laparoscopic hernia repair with #1 Novafil suture as well.  All incisions were then anesthetized with Marcaine.  I tried to perform bilateral inguinal nerve blocks with Marcaine as well.  All skin incisions were then closed with 4-0 Monocryl.  Steri-Strips were then applied.  The patient tolerated the procedure well.  All the counts were correct at the end of procedure.  The patient then extubated in the operating room and taken in stable condition to recovery room.     Abigail Miyamoto, M.D.     DB/MEDQ  D:  03/11/2015  T:  03/11/2015  Job:  [id number]

## 2020-10-28 ENCOUNTER — Other Ambulatory Visit: Payer: Self-pay | Admitting: Neurological Surgery

## 2020-10-28 ENCOUNTER — Other Ambulatory Visit (HOSPITAL_COMMUNITY): Payer: Self-pay | Admitting: Neurological Surgery

## 2020-10-28 DIAGNOSIS — M5412 Radiculopathy, cervical region: Secondary | ICD-10-CM

## 2020-11-03 ENCOUNTER — Ambulatory Visit (HOSPITAL_COMMUNITY)
Admission: RE | Admit: 2020-11-03 | Discharge: 2020-11-03 | Disposition: A | Discharge status: Home / Self Care | Payer: No Typology Code available for payment source | Source: Ambulatory Visit | Attending: Neurological Surgery | Admitting: Neurological Surgery

## 2020-11-03 ENCOUNTER — Other Ambulatory Visit: Payer: Self-pay

## 2020-11-03 DIAGNOSIS — M5412 Radiculopathy, cervical region: Secondary | ICD-10-CM | POA: Diagnosis not present

## 2020-11-03 IMAGING — CT CT CERVICAL SPINE W/O CM
3 of 4 series · 11 of 33 positions shown, 12 images · non-contrast
Comparison: Outside MRI [DATE].

CLINICAL DATA: Cervical radiculopathy.

EXAM:
CT CERVICAL SPINE WITHOUT CONTRAST
TECHNIQUE: Multidetector CT imaging of the cervical spine was performed without
intravenous contrast. Multiplanar CT image reconstructions were also
generated.

[Series 4: c spine soft · axial · 0.32mm/px · z∈[+1237,+1357]mm · 3 of 99 slices shown, 4 images]
[im 23/99  soft-tissue]
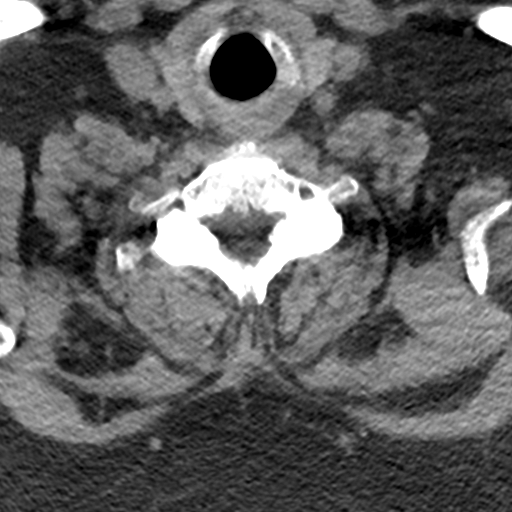
[im 23/99  bone]
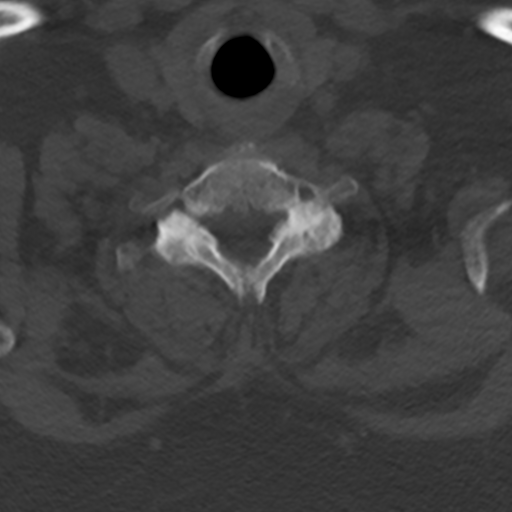
[im 53/99  bone]
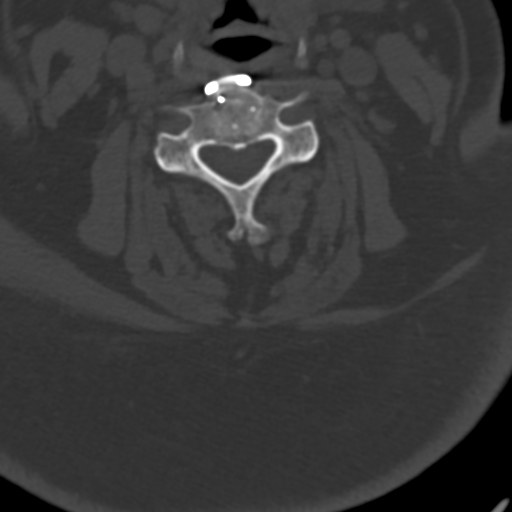
[im 83/99  bone]
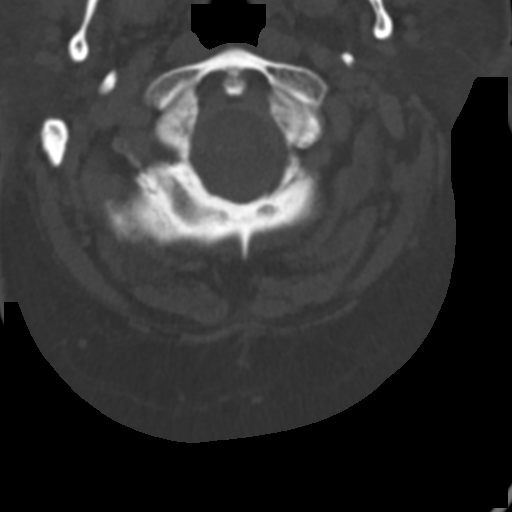

[Series 7: sagittal bone · sagittal · 0.24mm/px · 5 of 61 slices shown]
[im 11/61  bone]
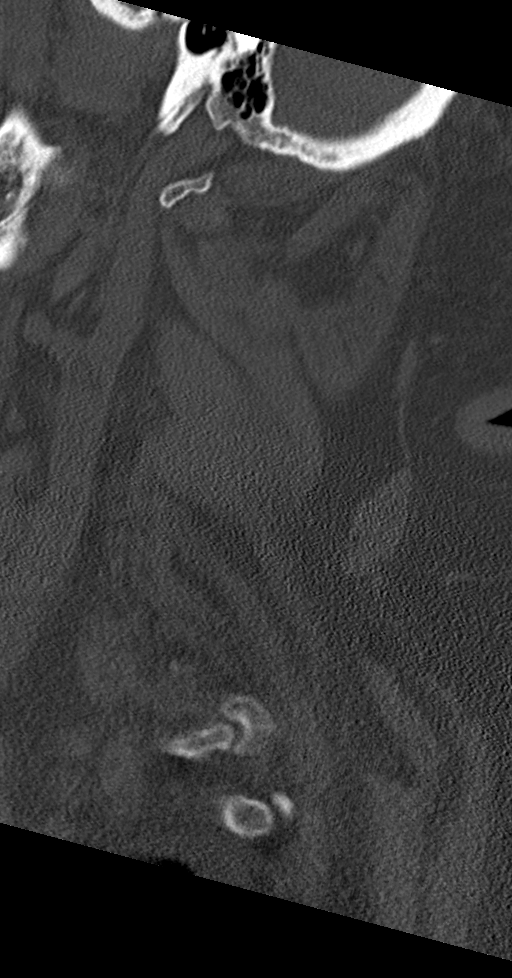
[im 21/61  bone]
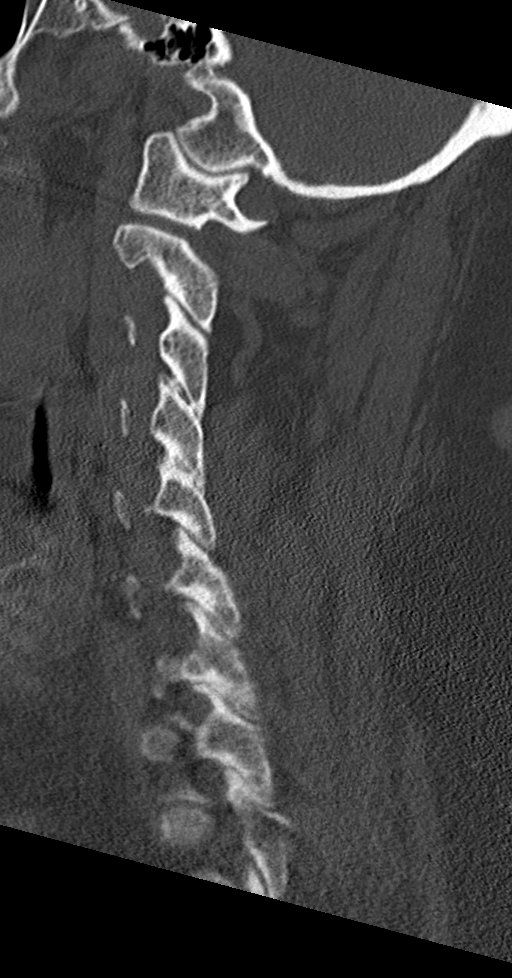
[im 31/61  bone]
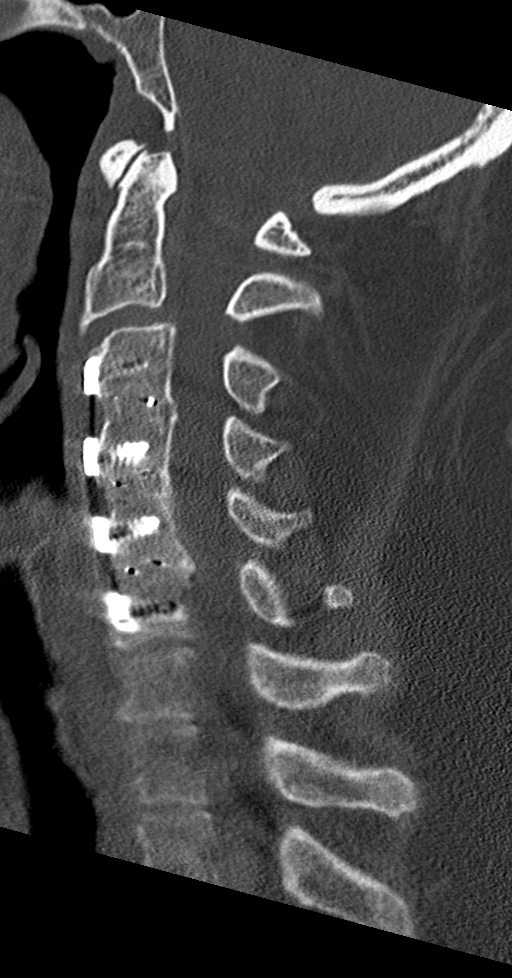
[im 41/61  bone]
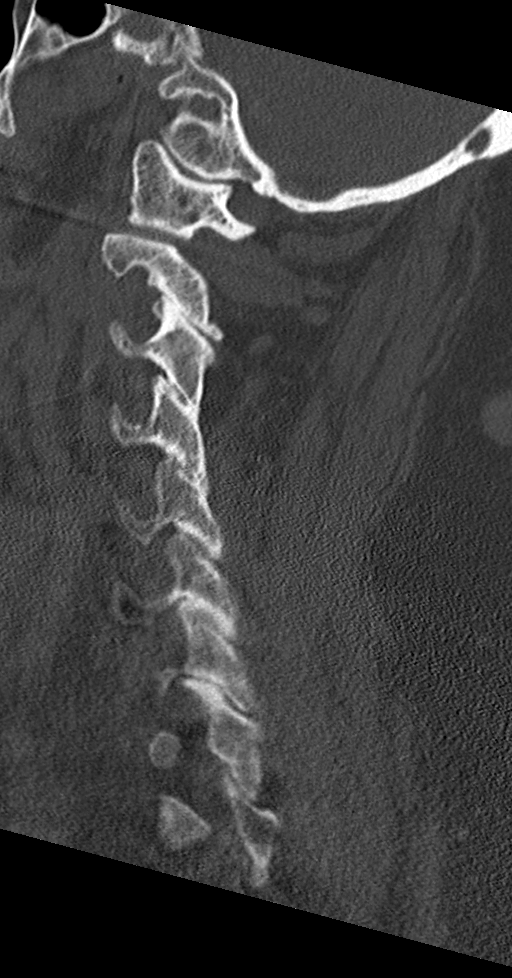
[im 51/61  bone]
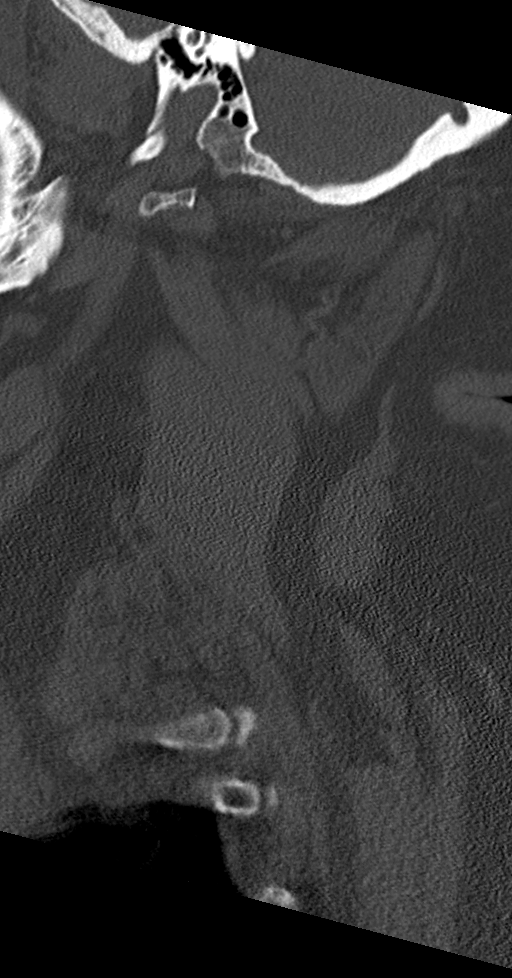

[Series 8: coronal bone · coronal · 0.23mm/px · 3 of 62 slices shown]
[im 13/62  bone]
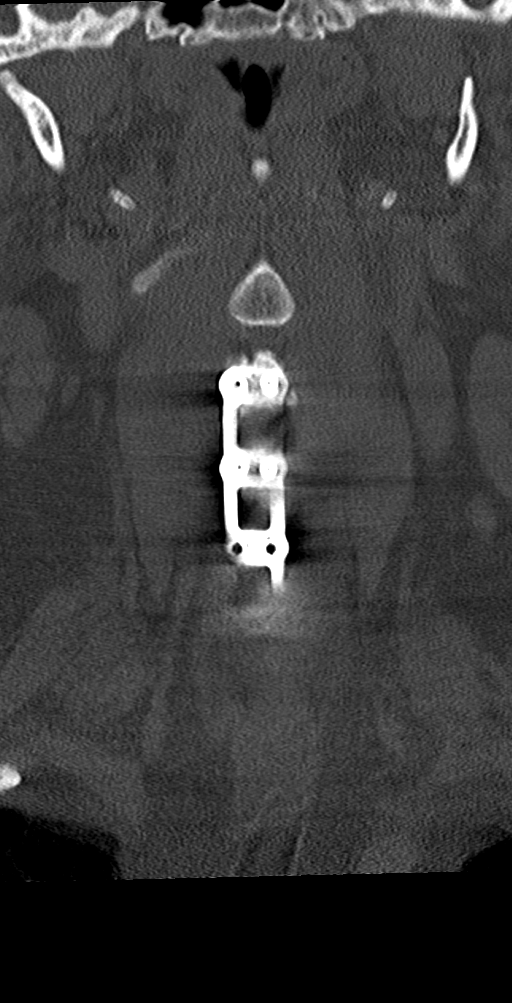
[im 25/62  bone]
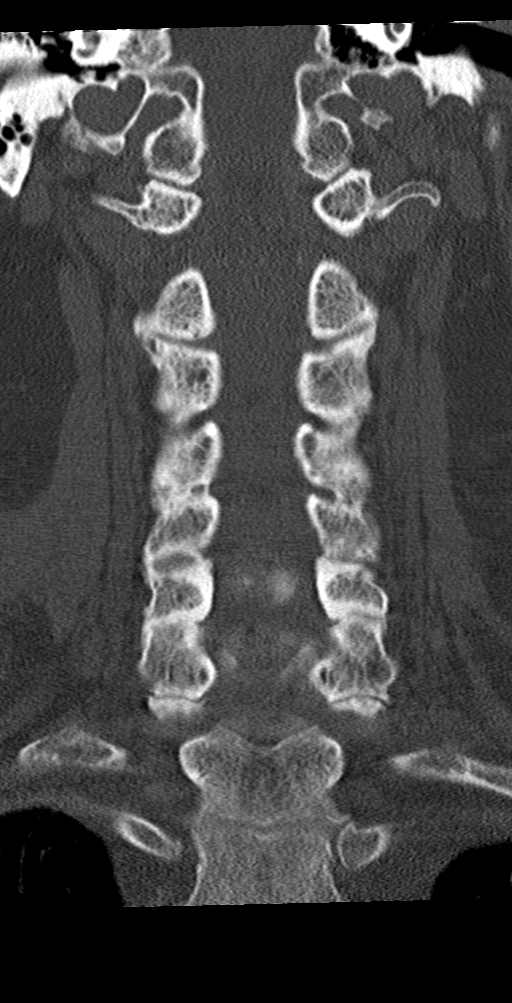
[im 37/62  bone]
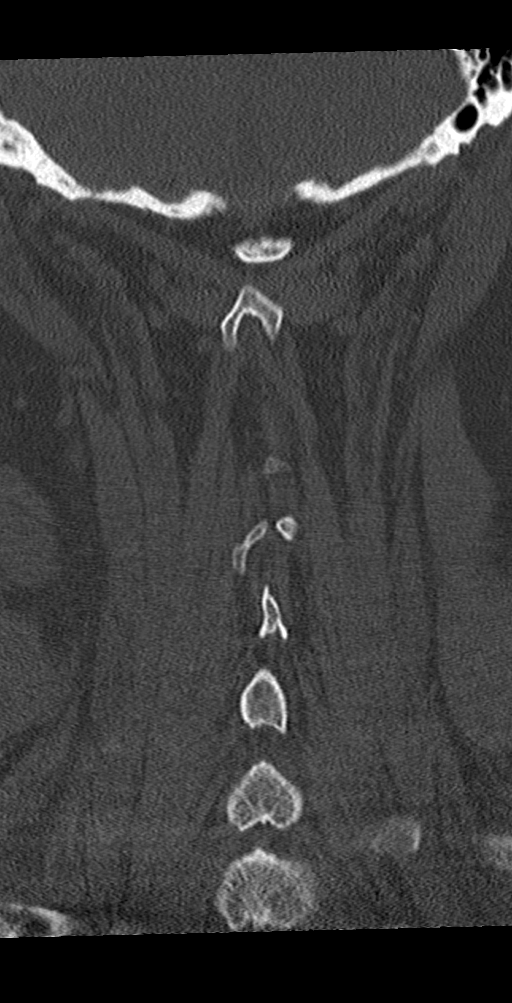

[11 of 33 positions shown; findings below may reference images not displayed]

FINDINGS: Alignment: Approximately 2 mm anterolisthesis of C2 on C3.

Skull base and vertebrae: Vertebral body heights are maintained.
ACDF spanning C3-C6. No evidence acute fracture.

Soft tissues and spinal canal: No prevertebral fluid or swelling. No
visible canal hematoma.

Disc levels:

Streak artifact from the hardware limits evaluation of the canal.

At C2-C3 there is bilateral facet and uncovertebral hypertrophy with
mild bony foraminal stenosis. Posterior disc bulge at this level
with suspected mild canal stenosis.

At C5-C6 there is posterior osteophytic ridging and bilateral
uncovertebral hypertrophy with suspected at least mild canal
stenosis.

At C6-C7 there is left greater than right uncovertebral hypertrophy
with suspected at least moderate left bony foraminal stenosis. And
mild canal stenosis.

Upper chest: Visualized lung apices are clear.
IMPRESSION: C3-C6 ACDF. Evaluation is limited by noncontrast CT with streak
artifact limiting evaluation of the canal. There is suspected at
least moderate left bony foraminal stenosis at C6-C7, at least mild
canal stenosis at C5-C6, and at least mild bilateral foraminal
stenosis at C2-C3. An MRI of the cervical spine could better
characterize the canal/cord/foramina if the patient is able.
Alternatively, a CT myelogram could also further evaluate.

## 2020-12-07 ENCOUNTER — Other Ambulatory Visit: Payer: Self-pay | Admitting: Neurological Surgery

## 2020-12-08 ENCOUNTER — Other Ambulatory Visit: Payer: Self-pay

## 2020-12-08 ENCOUNTER — Encounter (HOSPITAL_COMMUNITY): Payer: Self-pay | Admitting: Neurological Surgery

## 2020-12-08 ENCOUNTER — Other Ambulatory Visit (HOSPITAL_COMMUNITY)
Admission: RE | Admit: 2020-12-08 | Discharge: 2020-12-08 | Disposition: A | Discharge status: Home / Self Care | Payer: No Typology Code available for payment source | Source: Ambulatory Visit | Attending: Neurological Surgery | Admitting: Neurological Surgery

## 2020-12-08 DIAGNOSIS — Z01812 Encounter for preprocedural laboratory examination: Secondary | ICD-10-CM | POA: Diagnosis present

## 2020-12-08 DIAGNOSIS — Z20822 Contact with and (suspected) exposure to covid-19: Secondary | ICD-10-CM | POA: Diagnosis not present

## 2020-12-08 LAB — SARS CORONAVIRUS 2 (TAT 6-24 HRS): SARS Coronavirus 2: NEGATIVE

## 2020-12-08 MED ORDER — DEXTROSE 5 % IV SOLN
3.0000 g | INTRAVENOUS | Status: AC
  Administered 2020-12-09: 3 g via INTRAVENOUS
  Filled 2020-12-08: qty 3

## 2020-12-08 NOTE — Progress Notes (Signed)
Anesthesia Chart Review: Same day workup  History of restrictive lung physiology secondary to DISH and BMI of 44. OSA on BiPAP. 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  I cannot find any evidence of this diagnosis in epic or Care Everywhere.  Underwent left shoulder arthroscopy in January 2021 at [facility] under general anesthesia without complication.  Per intubation notes, glide scope was electively used. Anesthesia intubation note 09/25/2019 in Care Everywhere- "Smooth IV induction after adequate preoxygenation. No mask ventilation. Eyes taped prior to airway manipulation. Elective glidescope use, #4. Clear view of glottic opening, ETT through glottic opening atraumatically. Secured at 24 cm after bilateral breath sounds and +ETCO2 confirmed. Dentition & lips remain intact as pre-op assessment."  History of NSTEMI 2018 with stent placement.  He follows with cardiologist at the [place].  I have minimal records available for review.  He does have cardiac clearance from Lorelle Gibbs, PA-C dated 11/17/2020 states he is moderate risk and may proceed without further preop cardiac testing.  Instructed to hold aspirin 7 days prior to surgery.  Patient also has clearance from PCP at the [facility] Dr. Ellin Mayhew stating his RCRI is 1-1.3%, at least moderate risk due to cardiac history.  Per PAT RN, pt reported he is at his baseline, denied any new SOB or an anginal symptoms.   Hx of C3-6 ACDF.  Given lack of available records for review, I reviewed history with Dr. Marcene Duos.  He advised okay to proceed as planned, patient will be evaluated on day of surgery by assigned anesthesiologist.  Patient will need day of surgery labs and EKG.   Zannie Cove [facility] Short Stay Center/Anesthesiology Phone [phone] 12/08/2020 5:00 PM

## 2020-12-08 NOTE — Progress Notes (Signed)
PCP - [facility] [place] Cardiologist - Jillyn Hidden, MD Pulmonologist - Shelle Iron, MD ------ (all providers at [facility])  Chest x-ray -  EKG - DOS Stress Test -  ECHO -  Cardiac Cath - 07/07/2017  Sleep Study - yes CPAP - BPAP - does not know settings  Blood Thinner Instructions:  Aspirin Instructions: ASA per pt stopped it 7 days prior to his specialty surgery day for this procedure so he has not taken ASA for about 10-11 days   COVID TEST- 12/08/20  Anesthesia review: yes   -------------  SDW INSTRUCTIONS:  Your procedure is scheduled on 12/09/20. Please report to [facility] Main Entrance "A" at 05:30 A.M., and check in at the Admitting office. Call this number if you have problems the morning of surgery: [phone]   Remember: Do not eat or drink after midnight the night before your surgery  Medications to take morning of surgery with a sip of water include: allopurinol (ZYLOPRIM) cetirizine (ZYRTEC) colchicine  metoprolol tartrate (LOPRESSOR)  atorvastatin (LIPITOR)   As of today, STOP taking any Aspirin (unless otherwise instructed by your surgeon), Aleve, Naproxen, Ibuprofen, Motrin, Advil, Goody's, BC's, all herbal medications, fish oil, and all vitamins.    The Morning of Surgery Do not wear jewelry Do not wear lotions, powders, colognes, or deodorant Men may shave face and neck. Do not bring valuables to the hospital. [facility] is not responsible for any belongings or valuables. If you are a smoker, DO NOT Smoke 24 hours prior to surgery If you wear a CPAP at night please bring your mask the morning of surgery  Remember that you must have someone to transport you home after your surgery, and remain with you for 24 hours if you are discharged the same day. Please bring cases for contacts, glasses, hearing aids, dentures or bridgework because it cannot be worn into surgery.   Patients discharged the day of surgery will not be allowed to drive  home.   Please shower the NIGHT BEFORE SURGERY and the MORNING OF SURGERY with DIAL Soap. Wear comfortable clothes the morning of surgery. Oral Hygiene is also important to reduce your risk of infection.  Remember - BRUSH YOUR TEETH THE MORNING OF SURGERY WITH YOUR REGULAR TOOTHPASTE  Patient denies shortness of breath, fever, cough and chest pain.

## 2020-12-08 NOTE — Anesthesia Preprocedure Evaluation (Addendum)
Anesthesia Evaluation  Patient identified by MRN, date of birth, ID band Patient awake    Reviewed: Allergy & Precautions, H&P , NPO status , Patient's Chart, lab work & pertinent test results  Airway Mallampati: III   Neck ROM: limited    Dental   Pulmonary sleep apnea and Continuous Positive Airway Pressure Ventilation ,  Restrictive lung dz   breath sounds clear to auscultation       Cardiovascular hypertension, + CAD and + Cardiac Stents   Rhythm:regular Rate:Normal     Neuro/Psych    GI/Hepatic   Endo/Other  Morbid obesity  Renal/GU      Musculoskeletal  (+) Arthritis , Ankylosing spondylitis   Abdominal   Peds  Hematology   Anesthesia Other Findings   Reproductive/Obstetrics                            Anesthesia Physical Anesthesia Plan  ASA: III  Anesthesia Plan: General   Post-op Pain Management:    Induction: Intravenous  PONV Risk Score and Plan: 2 and Ondansetron, Dexamethasone, Midazolam and Treatment may vary due to age or medical condition  Airway Management Planned: Oral ETT and Video Laryngoscope Planned  Additional Equipment:   Intra-op Plan:   Post-operative Plan: Extubation in OR  Informed Consent: I have reviewed the patients History and Physical, chart, labs and discussed the procedure including the risks, benefits and alternatives for the proposed anesthesia with the patient or authorized representative who has indicated his/her understanding and acceptance.     Dental advisory given  Plan Discussed with: CRNA, Anesthesiologist and Surgeon  Anesthesia Plan Comments: (PAT note by Antionette Poles, PA-C: History of restrictive lung physiology secondary to DISH and BMI of 44. OSA on BiPAP. 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  I cannot find any evidence of this diagnosis in epic or Care Everywhere.  Underwent  left shoulder arthroscopy in January 2021 at [facility] under general anesthesia without complication.  Per intubation notes, glide scope was electively used. Anesthesia intubation note 09/25/2019 in Care Everywhere- "Smooth IV induction after adequate preoxygenation. No mask ventilation. Eyes taped prior to airway manipulation. Elective glidescope use, #4. Clear view of glottic opening, ETT through glottic opening atraumatically. Secured at 24 cm after bilateral breath sounds and +ETCO2 confirmed. Dentition & lips remain intact as pre-op assessment."  History of NSTEMI 2018 with stent placement.  He follows with cardiologist at the [place].  I have minimal records available for review.  He does have cardiac clearance from Lorelle Gibbs, PA-C dated 11/17/2020 states he is moderate risk and may proceed without further preop cardiac testing.  Instructed to hold aspirin 7 days prior to surgery.  Patient also has clearance from PCP at the [facility] Dr. Ellin Mayhew stating his RCRI is 1-1.3%, at least moderate risk due to cardiac history.  Per PAT RN, pt reported he is at his baseline, denied any new SOB or an anginal symptoms.   Hx of C3-6 ACDF.  Given lack of available records for review, I reviewed history with Dr. Marcene Duos.  He advised okay to proceed as planned, patient will be evaluated on day of surgery by assigned anesthesiologist.  Patient will need day of surgery labs and EKG. )       Anesthesia Quick Evaluation

## 2020-12-09 ENCOUNTER — Encounter (HOSPITAL_COMMUNITY)
Admission: RE | Disposition: A | Discharge status: Home / Self Care | Payer: Self-pay | Source: Home / Self Care | Attending: Neurological Surgery

## 2020-12-09 ENCOUNTER — Ambulatory Visit (HOSPITAL_COMMUNITY): Payer: No Typology Code available for payment source | Admitting: Certified Registered"

## 2020-12-09 ENCOUNTER — Ambulatory Visit (HOSPITAL_COMMUNITY): Payer: No Typology Code available for payment source

## 2020-12-09 ENCOUNTER — Observation Stay (HOSPITAL_COMMUNITY)
Admission: RE | Admit: 2020-12-09 | Discharge: 2020-12-09 | Disposition: A | Discharge status: Home / Self Care | Payer: No Typology Code available for payment source | Attending: Neurological Surgery | Admitting: Neurological Surgery

## 2020-12-09 DIAGNOSIS — Z9989 Dependence on other enabling machines and devices: Secondary | ICD-10-CM | POA: Diagnosis not present

## 2020-12-09 DIAGNOSIS — Z419 Encounter for procedure for purposes other than remedying health state, unspecified: Secondary | ICD-10-CM

## 2020-12-09 DIAGNOSIS — I10 Essential (primary) hypertension: Secondary | ICD-10-CM | POA: Insufficient documentation

## 2020-12-09 DIAGNOSIS — M5412 Radiculopathy, cervical region: Secondary | ICD-10-CM | POA: Diagnosis not present

## 2020-12-09 HISTORY — PX: ANTERIOR CERVICAL DECOMP/DISCECTOMY FUSION: SHX1161

## 2020-12-09 LAB — CBC
HCT: 39.2 % (ref 39.0–52.0)
Hemoglobin: 12.5 g/dL — ABNORMAL LOW (ref 13.0–17.0)
MCH: 30.5 pg (ref 26.0–34.0)
MCHC: 31.9 g/dL (ref 30.0–36.0)
MCV: 95.6 fL (ref 80.0–100.0)
Platelets: 317 10*3/uL (ref 150–400)
RBC: 4.1 MIL/uL — ABNORMAL LOW (ref 4.22–5.81)
RDW: 13.2 % (ref 11.5–15.5)
WBC: 13.1 10*3/uL — ABNORMAL HIGH (ref 4.0–10.5)
nRBC: 0 % (ref 0.0–0.2)

## 2020-12-09 LAB — BASIC METABOLIC PANEL
Anion gap: 6 (ref 5–15)
BUN: 17 mg/dL (ref 8–23)
CO2: 29 mmol/L (ref 22–32)
Calcium: 8.8 mg/dL — ABNORMAL LOW (ref 8.9–10.3)
Chloride: 103 mmol/L (ref 98–111)
Creatinine, Ser: 1.39 mg/dL — ABNORMAL HIGH (ref 0.61–1.24)
GFR, Estimated: 57 mL/min — ABNORMAL LOW (ref >60)
Glucose, Bld: 120 mg/dL — ABNORMAL HIGH (ref 70–99)
Potassium: 3.8 mmol/L (ref 3.5–5.1)
Sodium: 138 mmol/L (ref 135–145)

## 2020-12-09 LAB — TYPE AND SCREEN
ABO/RH(D): O POS
Antibody Screen: NEGATIVE

## 2020-12-09 LAB — SURGICAL PCR SCREEN
MRSA, PCR: NEGATIVE
Staphylococcus aureus: POSITIVE — AB

## 2020-12-09 LAB — ABO/RH: ABO/RH(D): O POS

## 2020-12-09 IMAGING — RF DG C-ARM 1-60 MIN
1 series · 1 of 1 positions shown · non-contrast
Comparison: CT cervical spine [DATE]. Cervical spine MRI
[DATE].

CLINICAL DATA: Elective surgery. Additional history provided:
Cervical 6-7 anterior cervical decompression/discectomy fusion for
cervical radiculopathy. Parotid fluoroscopy time 14 seconds (23.347
mGy).

EXAM:
DG CERVICAL SPINE - 1 VIEW; DG C-ARM 1-60 MIN

[Series 1: unknown protocol · 0.14mm/px · 1 of 1 slices shown]
[im 1/1]
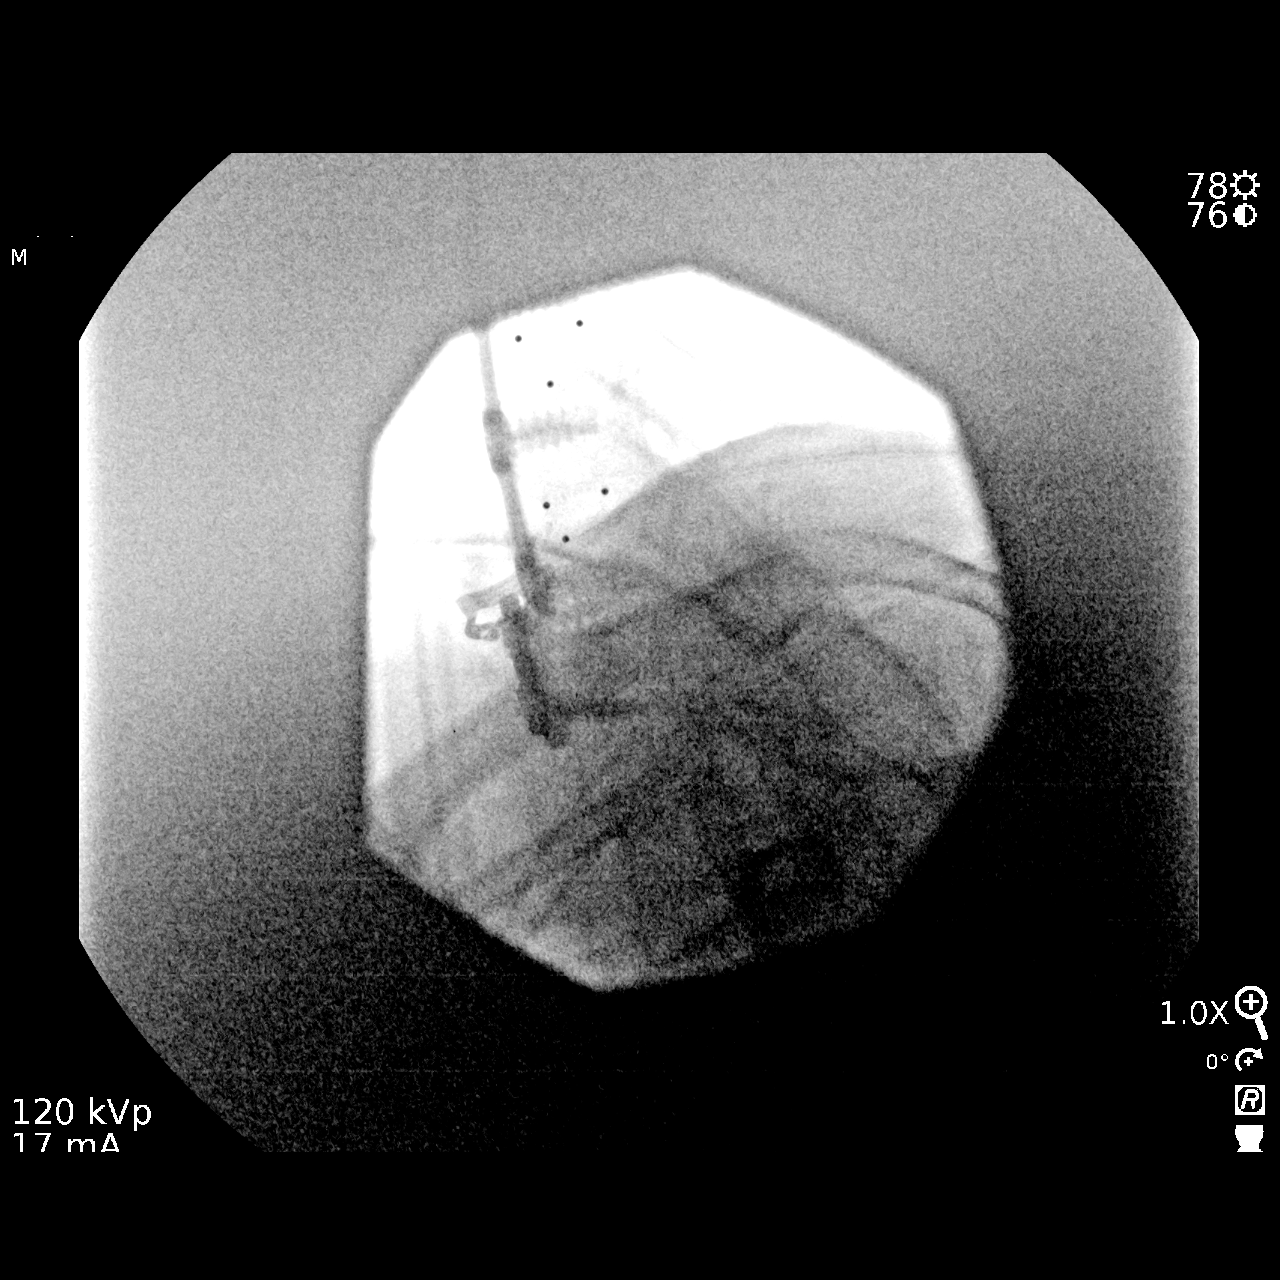

[1 of 1 positions shown; findings below may reference images not displayed]

FINDINGS: A single lateral view intraoperative fluoroscopic image of the lower
cervical spine is submitted. The image demonstrates new ACDF
hardware at the C6-C7 level. Partially imaged pre-existing ACDF
hardware extending caudally to the C6 level.
IMPRESSION: Single lateral view intraoperative fluoroscopic image of the
cervical spine from C6-C7 ACDF.

## 2020-12-09 IMAGING — RF DG CERVICAL SPINE 1V
1 series · 1 of 1 positions shown · non-contrast
Comparison: CT cervical spine [DATE]. Cervical spine MRI
[DATE].

CLINICAL DATA: Elective surgery. Additional history provided:
Cervical 6-7 anterior cervical decompression/discectomy fusion for
cervical radiculopathy. Parotid fluoroscopy time 14 seconds (23.347
mGy).

EXAM:
DG CERVICAL SPINE - 1 VIEW; DG C-ARM 1-60 MIN

[Series 1: unknown protocol · 0.14mm/px · 1 of 1 slices shown]
[im 1/1]
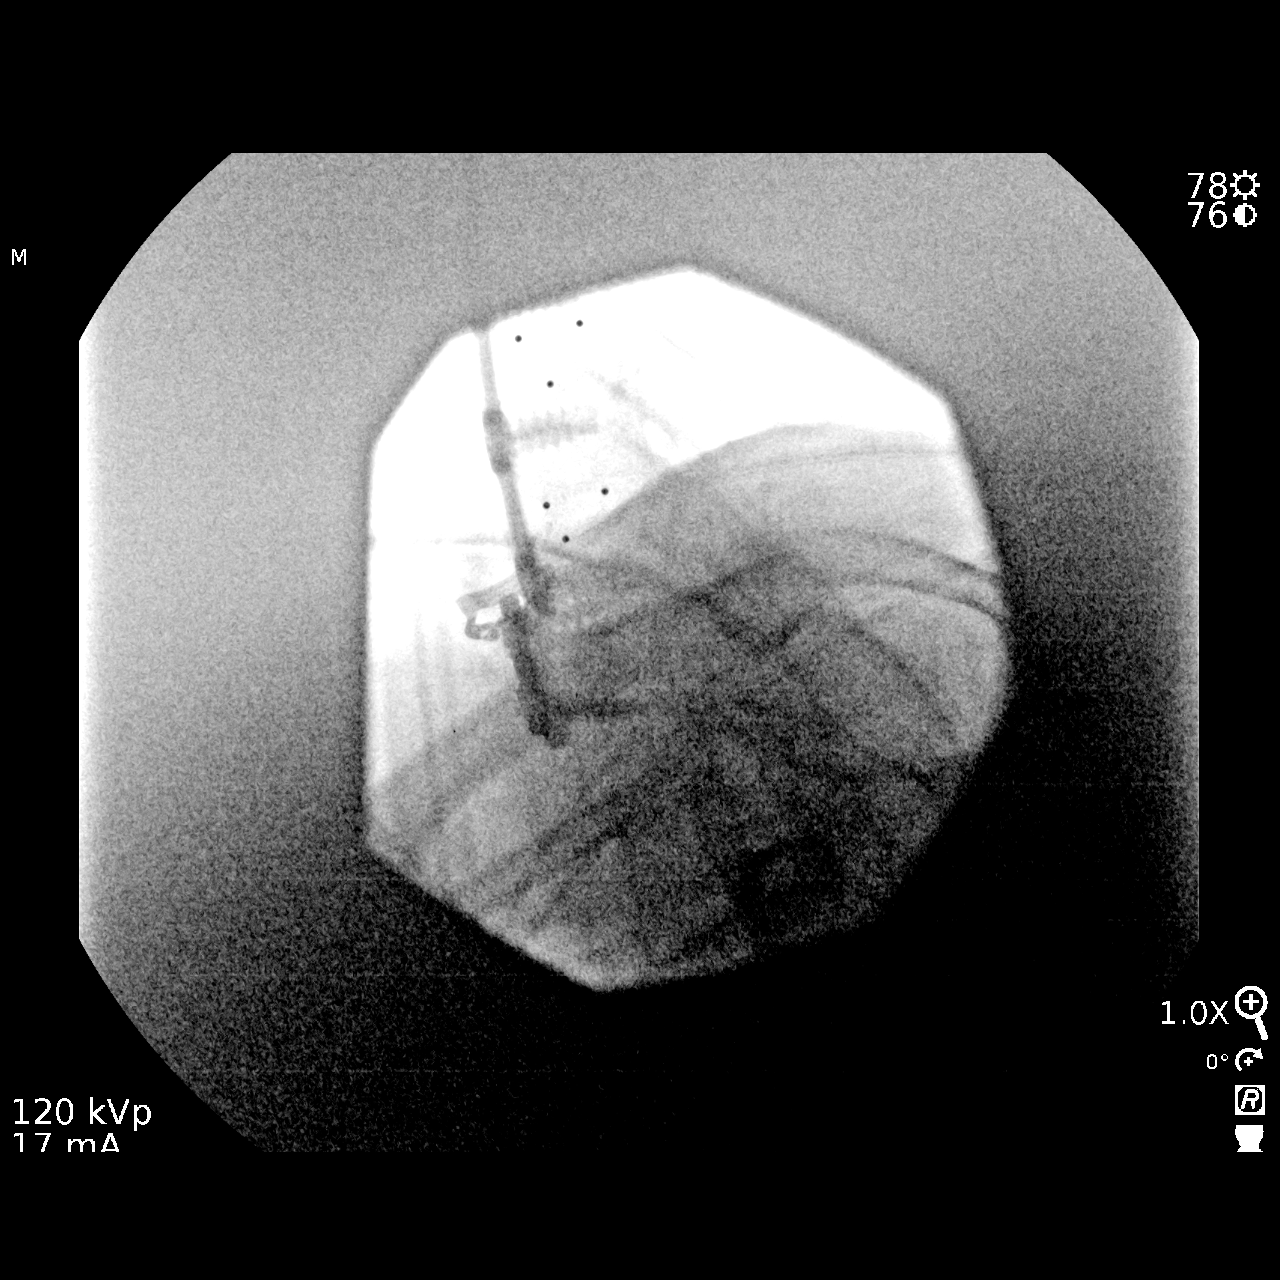

[1 of 1 positions shown; findings below may reference images not displayed]

FINDINGS: A single lateral view intraoperative fluoroscopic image of the lower
cervical spine is submitted. The image demonstrates new ACDF
hardware at the C6-C7 level. Partially imaged pre-existing ACDF
hardware extending caudally to the C6 level.
IMPRESSION: Single lateral view intraoperative fluoroscopic image of the
cervical spine from C6-C7 ACDF.

## 2020-12-09 SURGERY — ANTERIOR CERVICAL DECOMPRESSION/DISCECTOMY FUSION 1 LEVEL
Anesthesia: General | Site: Spine Cervical

## 2020-12-09 MED ORDER — DEXAMETHASONE SODIUM PHOSPHATE 10 MG/ML IJ SOLN
INTRAMUSCULAR | Status: AC
  Filled 2020-12-09: qty 1

## 2020-12-09 MED ORDER — ACETAMINOPHEN 650 MG RE SUPP: 650.0000 mg | RECTAL | Status: DC | PRN

## 2020-12-09 MED ORDER — PHENYLEPHRINE 40 MCG/ML (10ML) SYRINGE FOR IV PUSH (FOR BLOOD PRESSURE SUPPORT)
PREFILLED_SYRINGE | INTRAVENOUS | Status: AC
  Filled 2020-12-09: qty 10

## 2020-12-09 MED ORDER — ONDANSETRON HCL 4 MG/2ML IJ SOLN: 4.0000 mg | Freq: Four times a day (QID) | INTRAMUSCULAR | Status: DC | PRN

## 2020-12-09 MED ORDER — THROMBIN 5000 UNITS EX SOLR: OROMUCOSAL | Status: DC | PRN

## 2020-12-09 MED ORDER — POLYETHYLENE GLYCOL 3350 17 G PO PACK: 17.0000 g | PACK | Freq: Every day | ORAL | Status: DC | PRN

## 2020-12-09 MED ORDER — HYDROMORPHONE HCL 1 MG/ML IJ SOLN
1.0000 mg | INTRAMUSCULAR | Status: DC | PRN
Start: 2020-12-09 — End: 2020-12-09
  Administered 2020-12-09: 1 mg via INTRAVENOUS
  Filled 2020-12-09: qty 1

## 2020-12-09 MED ORDER — LORATADINE 10 MG PO TABS
10.0000 mg | ORAL_TABLET | Freq: Every day | ORAL | Status: DC
  Administered 2020-12-09: 10 mg via ORAL
  Filled 2020-12-09: qty 1

## 2020-12-09 MED ORDER — LIDOCAINE-EPINEPHRINE 1 %-1:100000 IJ SOLN
INTRAMUSCULAR | Status: AC
  Filled 2020-12-09: qty 1

## 2020-12-09 MED ORDER — ONDANSETRON HCL 4 MG/2ML IJ SOLN
INTRAMUSCULAR | Status: AC
  Filled 2020-12-09: qty 2

## 2020-12-09 MED ORDER — MIDAZOLAM HCL 5 MG/5ML IJ SOLN
INTRAMUSCULAR | Status: DC | PRN
  Administered 2020-12-09: 2 mg via INTRAVENOUS

## 2020-12-09 MED ORDER — SODIUM CHLORIDE 0.9 % IV SOLN: 250.0000 mL | INTRAVENOUS | Status: DC

## 2020-12-09 MED ORDER — FENTANYL CITRATE (PF) 250 MCG/5ML IJ SOLN
INTRAMUSCULAR | Status: DC | PRN
  Administered 2020-12-09 (×2): 50 ug via INTRAVENOUS

## 2020-12-09 MED ORDER — ONDANSETRON HCL 4 MG/2ML IJ SOLN
INTRAMUSCULAR | Status: DC | PRN
  Administered 2020-12-09: 4 mg via INTRAVENOUS

## 2020-12-09 MED ORDER — OXYCODONE HCL 5 MG PO TABS
5.0000 mg | ORAL_TABLET | Freq: Once | ORAL | Status: AC | PRN
  Administered 2020-12-09: 5 mg via ORAL

## 2020-12-09 MED ORDER — 0.9 % SODIUM CHLORIDE (POUR BTL) OPTIME
TOPICAL | Status: DC | PRN
  Administered 2020-12-09: 1000 mL

## 2020-12-09 MED ORDER — ATORVASTATIN CALCIUM 10 MG PO TABS: 20.0000 mg | ORAL_TABLET | Freq: Every day | ORAL | Status: DC

## 2020-12-09 MED ORDER — PROPOFOL 10 MG/ML IV BOLUS
INTRAVENOUS | Status: DC | PRN
  Administered 2020-12-09: 180 mg via INTRAVENOUS

## 2020-12-09 MED ORDER — PHENOL 1.4 % MT LIQD: 1.0000 | OROMUCOSAL | Status: DC | PRN

## 2020-12-09 MED ORDER — LIDOCAINE 2% (20 MG/ML) 5 ML SYRINGE
INTRAMUSCULAR | Status: DC | PRN
  Administered 2020-12-09: 60 mg via INTRAVENOUS

## 2020-12-09 MED ORDER — CHLORHEXIDINE GLUCONATE 0.12 % MT SOLN
15.0000 mL | Freq: Once | OROMUCOSAL | Status: AC
  Administered 2020-12-09: 15 mL via OROMUCOSAL
  Filled 2020-12-09: qty 15

## 2020-12-09 MED ORDER — ASPIRIN EC 81 MG PO TBEC: 81.0000 mg | DELAYED_RELEASE_TABLET | Freq: Every day | ORAL | 11 refills | Status: AC

## 2020-12-09 MED ORDER — DOCUSATE SODIUM 100 MG PO CAPS: 100.0000 mg | ORAL_CAPSULE | Freq: Two times a day (BID) | ORAL | Status: DC

## 2020-12-09 MED ORDER — LIDOCAINE 2% (20 MG/ML) 5 ML SYRINGE
INTRAMUSCULAR | Status: AC
  Filled 2020-12-09: qty 5

## 2020-12-09 MED ORDER — SODIUM CHLORIDE 0.9% FLUSH: 3.0000 mL | INTRAVENOUS | Status: DC | PRN

## 2020-12-09 MED ORDER — ACETAMINOPHEN 325 MG PO TABS: 650.0000 mg | ORAL_TABLET | ORAL | Status: DC | PRN

## 2020-12-09 MED ORDER — PHENYLEPHRINE HCL-NACL 10-0.9 MG/250ML-% IV SOLN
INTRAVENOUS | Status: DC | PRN
  Administered 2020-12-09: 20 ug/min via INTRAVENOUS

## 2020-12-09 MED ORDER — ROCURONIUM BROMIDE 10 MG/ML (PF) SYRINGE
PREFILLED_SYRINGE | INTRAVENOUS | Status: DC | PRN
  Administered 2020-12-09: 40 mg via INTRAVENOUS
  Administered 2020-12-09: 60 mg via INTRAVENOUS

## 2020-12-09 MED ORDER — LACTATED RINGERS IV SOLN: INTRAVENOUS | Status: DC

## 2020-12-09 MED ORDER — CEFAZOLIN SODIUM-DEXTROSE 2-4 GM/100ML-% IV SOLN
2.0000 g | Freq: Three times a day (TID) | INTRAVENOUS | Status: DC
  Administered 2020-12-09: 2 g via INTRAVENOUS
  Filled 2020-12-09: qty 100

## 2020-12-09 MED ORDER — OXYCODONE HCL 5 MG/5ML PO SOLN: 5.0000 mg | Freq: Once | ORAL | Status: AC | PRN

## 2020-12-09 MED ORDER — CYCLOBENZAPRINE HCL 10 MG PO TABS
10.0000 mg | ORAL_TABLET | Freq: Three times a day (TID) | ORAL | Status: DC | PRN
  Administered 2020-12-09: 10 mg via ORAL

## 2020-12-09 MED ORDER — OXYCODONE HCL 5 MG PO TABS: 5.0000 mg | ORAL_TABLET | ORAL | Status: DC | PRN

## 2020-12-09 MED ORDER — OXYCODONE HCL 5 MG PO TABS
ORAL_TABLET | ORAL | Status: AC
  Filled 2020-12-09: qty 1

## 2020-12-09 MED ORDER — SODIUM CHLORIDE 0.9% FLUSH: 3.0000 mL | Freq: Two times a day (BID) | INTRAVENOUS | Status: DC

## 2020-12-09 MED ORDER — MIDAZOLAM HCL 2 MG/2ML IJ SOLN
INTRAMUSCULAR | Status: AC
  Filled 2020-12-09: qty 2

## 2020-12-09 MED ORDER — THROMBIN 5000 UNITS EX SOLR
CUTANEOUS | Status: AC
  Filled 2020-12-09: qty 5000

## 2020-12-09 MED ORDER — CYCLOBENZAPRINE HCL 10 MG PO TABS
ORAL_TABLET | ORAL | Status: AC
  Filled 2020-12-09: qty 1

## 2020-12-09 MED ORDER — HYDROCHLOROTHIAZIDE 12.5 MG PO CAPS
12.5000 mg | ORAL_CAPSULE | Freq: Every day | ORAL | Status: DC
  Administered 2020-12-09: 12.5 mg via ORAL
  Filled 2020-12-09: qty 1

## 2020-12-09 MED ORDER — MENTHOL 3 MG MT LOZG: 1.0000 | LOZENGE | OROMUCOSAL | Status: DC | PRN

## 2020-12-09 MED ORDER — LIDOCAINE-EPINEPHRINE 1 %-1:100000 IJ SOLN
INTRAMUSCULAR | Status: DC | PRN
  Administered 2020-12-09: 7 mL

## 2020-12-09 MED ORDER — PHENYLEPHRINE 40 MCG/ML (10ML) SYRINGE FOR IV PUSH (FOR BLOOD PRESSURE SUPPORT)
PREFILLED_SYRINGE | INTRAVENOUS | Status: DC | PRN
  Administered 2020-12-09 (×3): 80 ug via INTRAVENOUS

## 2020-12-09 MED ORDER — ORAL CARE MOUTH RINSE: 15.0000 mL | Freq: Once | OROMUCOSAL | Status: AC

## 2020-12-09 MED ORDER — ONDANSETRON HCL 4 MG PO TABS: 4.0000 mg | ORAL_TABLET | Freq: Four times a day (QID) | ORAL | Status: DC | PRN

## 2020-12-09 MED ORDER — DEXAMETHASONE SODIUM PHOSPHATE 10 MG/ML IJ SOLN
INTRAMUSCULAR | Status: DC | PRN
  Administered 2020-12-09: 10 mg via INTRAVENOUS

## 2020-12-09 MED ORDER — FENTANYL CITRATE (PF) 100 MCG/2ML IJ SOLN
INTRAMUSCULAR | Status: AC
  Filled 2020-12-09: qty 2

## 2020-12-09 MED ORDER — FENTANYL CITRATE (PF) 100 MCG/2ML IJ SOLN
25.0000 ug | INTRAMUSCULAR | Status: DC | PRN
  Administered 2020-12-09: 50 ug via INTRAVENOUS

## 2020-12-09 MED ORDER — ALLOPURINOL 300 MG PO TABS: 300.0000 mg | ORAL_TABLET | Freq: Every day | ORAL | Status: DC

## 2020-12-09 MED ORDER — COLCHICINE 0.6 MG PO TABS: 0.6000 mg | ORAL_TABLET | Freq: Every day | ORAL | Status: DC | PRN

## 2020-12-09 MED ORDER — METOPROLOL TARTRATE 25 MG PO TABS
50.0000 mg | ORAL_TABLET | Freq: Every day | ORAL | Status: DC
Start: 2020-12-09 — End: 2020-12-09

## 2020-12-09 MED ORDER — OXYCODONE HCL 5 MG PO TABS
10.0000 mg | ORAL_TABLET | ORAL | Status: DC | PRN
Start: 2020-12-09 — End: 2020-12-09
  Administered 2020-12-09: 10 mg via ORAL
  Filled 2020-12-09: qty 2

## 2020-12-09 MED ORDER — ROCURONIUM BROMIDE 10 MG/ML (PF) SYRINGE
PREFILLED_SYRINGE | INTRAVENOUS | Status: AC
  Filled 2020-12-09: qty 10

## 2020-12-09 MED ORDER — LISINOPRIL-HYDROCHLOROTHIAZIDE 20-12.5 MG PO TABS: 1.0000 | ORAL_TABLET | Freq: Every day | ORAL | Status: DC

## 2020-12-09 MED ORDER — GABAPENTIN 300 MG PO CAPS: 600.0000 mg | ORAL_CAPSULE | Freq: Every day | ORAL | Status: DC

## 2020-12-09 MED ORDER — FENTANYL CITRATE (PF) 250 MCG/5ML IJ SOLN
INTRAMUSCULAR | Status: AC
  Filled 2020-12-09: qty 5

## 2020-12-09 MED ORDER — SUGAMMADEX SODIUM 200 MG/2ML IV SOLN
INTRAVENOUS | Status: DC | PRN
  Administered 2020-12-09: 280 mg via INTRAVENOUS

## 2020-12-09 MED ORDER — PROPOFOL 10 MG/ML IV BOLUS
INTRAVENOUS | Status: AC
  Filled 2020-12-09: qty 20

## 2020-12-09 MED ORDER — LISINOPRIL 20 MG PO TABS
20.0000 mg | ORAL_TABLET | Freq: Every day | ORAL | Status: DC
  Administered 2020-12-09: 20 mg via ORAL
  Filled 2020-12-09: qty 1

## 2020-12-09 SURGICAL SUPPLY — 47 items
BAND RUBBER #18 3X1/16 STRL (MISCELLANEOUS) ×4 IMPLANT
BLADE SURG 11 STRL SS (BLADE) ×2 IMPLANT
BUR MATCHSTICK NEURO 3.0 LAGG (BURR) ×2 IMPLANT
CANISTER SUCT 3000ML PPV (MISCELLANEOUS) ×2 IMPLANT
DECANTER SPIKE VIAL GLASS SM (MISCELLANEOUS) ×2 IMPLANT
DERMABOND ADVANCED (GAUZE/BANDAGES/DRESSINGS) ×1
DERMABOND ADVANCED .7 DNX12 (GAUZE/BANDAGES/DRESSINGS) ×1 IMPLANT
DRAPE C-ARM 42X72 X-RAY (DRAPES) ×4 IMPLANT
DRAPE LAPAROTOMY 100X72 PEDS (DRAPES) ×2 IMPLANT
DRAPE MICROSCOPE LEICA (MISCELLANEOUS) ×2 IMPLANT
DURAPREP 6ML APPLICATOR 50/CS (WOUND CARE) ×2 IMPLANT
ELECT COATED BLADE 2.86 ST (ELECTRODE) ×2 IMPLANT
ELECT REM PT RETURN 9FT ADLT (ELECTROSURGICAL) ×2
ELECTRODE REM PT RTRN 9FT ADLT (ELECTROSURGICAL) ×1 IMPLANT
GAUZE 4X4 16PLY RFD (DISPOSABLE) IMPLANT
GLOVE BIOGEL PI IND STRL 7.5 (GLOVE) ×2 IMPLANT
GLOVE BIOGEL PI INDICATOR 7.5 (GLOVE) ×2
GLOVE ECLIPSE 7.5 STRL STRAW (GLOVE) ×2 IMPLANT
GLOVE EXAM NITRILE LRG STRL (GLOVE) IMPLANT
GLOVE EXAM NITRILE XL STR (GLOVE) IMPLANT
GLOVE EXAM NITRILE XS STR PU (GLOVE) IMPLANT
GOWN STRL REUS W/ TWL LRG LVL3 (GOWN DISPOSABLE) ×2 IMPLANT
GOWN STRL REUS W/ TWL XL LVL3 (GOWN DISPOSABLE) IMPLANT
GOWN STRL REUS W/TWL 2XL LVL3 (GOWN DISPOSABLE) IMPLANT
GOWN STRL REUS W/TWL LRG LVL3 (GOWN DISPOSABLE) ×2
GOWN STRL REUS W/TWL XL LVL3 (GOWN DISPOSABLE)
HEMOSTAT POWDER KIT SURGIFOAM (HEMOSTASIS) ×2 IMPLANT
KIT BASIN OR (CUSTOM PROCEDURE TRAY) ×2 IMPLANT
KIT TURNOVER KIT B (KITS) ×2 IMPLANT
NEEDLE HYPO 22GX1.5 SAFETY (NEEDLE) ×2 IMPLANT
NEEDLE SPNL 18GX3.5 QUINCKE PK (NEEDLE) ×2 IMPLANT
NS IRRIG 1000ML POUR BTL (IV SOLUTION) ×2 IMPLANT
PACK LAMINECTOMY NEURO (CUSTOM PROCEDURE TRAY) ×2 IMPLANT
PAD ARMBOARD 7.5X6 YLW CONV (MISCELLANEOUS) ×6 IMPLANT
PIN DISTRACTION 14MM (PIN) IMPLANT
PLATE ANT CERV ATLANTIS 19 (Plate) ×2 IMPLANT
SCREW SELF TAP VAR 4.0X13 (Screw) ×8 IMPLANT
SPACER BONE CORNERSTONE 7X14 (Orthopedic Implant) ×2 IMPLANT
SPONGE INTESTINAL PEANUT (DISPOSABLE) ×2 IMPLANT
SPONGE SURGIFOAM ABS GEL SZ50 (HEMOSTASIS) IMPLANT
STAPLER VISISTAT 35W (STAPLE) IMPLANT
SUT MNCRL AB 3-0 PS2 18 (SUTURE) ×2 IMPLANT
SUT VIC AB 3-0 SH 8-18 (SUTURE) ×2 IMPLANT
TAPE CLOTH 3X10 TAN LF (GAUZE/BANDAGES/DRESSINGS) ×2 IMPLANT
TOWEL GREEN STERILE (TOWEL DISPOSABLE) ×2 IMPLANT
TOWEL GREEN STERILE FF (TOWEL DISPOSABLE) ×2 IMPLANT
WATER STERILE IRR 1000ML POUR (IV SOLUTION) ×2 IMPLANT

## 2020-12-09 NOTE — Discharge Summary (Signed)
Discharge Summary  Date of Admission: 12/09/2020  Date of Discharge: 12/09/20  Attending Physician: Autumn Patty, MD  Hospital Course: Patient was admitted for observation following an uncomplicated C6-7 ACDF. He was recovered in PACU and transferred to [facility]. On the night of surgery, he wanted to go home instead of stay the night for observation. I recommended that he stay and he did not wish to do so. He was therefore discharged home on 12/09/20. He will follow up in clinic with me in 2 weeks.  Neurologic exam at discharge:  AOx3, PERRL, EOMI, FS, TM Strength 5/5 x4, SILTx4  Discharge diagnosis: Cervical radiculopathy  Jadene Pierini, MD 12/09/20 4:42 PM

## 2020-12-09 NOTE — Evaluation (Signed)
Occupational Therapy Evaluation Patient Details Name: Bill [name] MRN: [id number] DOB: Feb 01, 1956 Today's Date: 12/09/2020    History of Present Illness pt is a 65 y/o male presenting 3/24 with worsening L>R radiculopathy, s/p C6-7 ACDF.  PMHx: prior C3-4/4-5/5-6 ACDF, ADHD, Ankylosing spondylities, HTN, OSA   Clinical Impression   Pt is typically modified independent at baseline with AE for LB ADL. He drives, is still working, and enjoys shooting pool, and has a Nurse, mental health. Today Pt is close to baseline post-op. Spent significant time reinforcing cervical precautions with focus on ADL and compensatory strategies. He will have the assist of his friend before she goes to [facility] for a pool tournament next week. See ADL section for information on what Pt was educated on. Education complete and OT will sign off at this time. No DME or follow up needed.    Follow Up Recommendations  No OT follow up;Supervision - Intermittent    Equipment Recommendations  None recommended by OT    Recommendations for Other Services       Precautions / Restrictions Precautions Precautions: Cervical Precaution Booklet Issued: Yes (comment) Precaution Comments: reviewed in full Required Braces or Orthoses:  (None) Restrictions Weight Bearing Restrictions: No      Mobility Bed Mobility Overal bed mobility: Modified Independent                  Transfers Overall transfer level: Modified independent                    Balance                                           ADL either performed or assessed with clinical judgement   ADL Overall ADL's : At baseline                                       General ADL Comments: spent significant time reviewing cervical precautions with focus on ADL - Pt has reacher/grabber for LB dressing, long handle sponge for bathing, has a sock aide if he needs it. He already uses a cup at the sink during oral care to  prevent bending, he has "set up his home" prior to sx putting things at counter height and getting things out of cubbards. Educuated on importance of NOT driving due to not turning his head until cleared by MD. Educated on bed mobility maintaining cervical precautions. Pt verbalized understanding of all these things and confirmed "yes, that's how I already do that" Pt will initially have assist from good friend. I even went over awarness of body mechanics as he refills his dogs bowls ("I squat I don't bend at the hips") Pt left in bed with handout on side table     Vision Patient Visual Report: No change from baseline       Perception     Praxis      Pertinent Vitals/Pain Pain Assessment: Faces Faces Pain Scale: Hurts a little bit Pain Location: surgical site area Pain Descriptors / Indicators: Penetrating;Sore Pain Intervention(s): Monitored during session;Repositioned     Hand Dominance     Extremity/Trunk Assessment Upper Extremity Assessment Upper Extremity Assessment: Overall WFL for tasks assessed   Lower Extremity Assessment Lower Extremity Assessment: Defer to PT evaluation   Cervical /  Trunk Assessment Cervical / Trunk Assessment:  (anterior cervical surgical site)   Communication Communication Communication: No difficulties   Cognition Arousal/Alertness: Awake/alert Behavior During Therapy: WFL for tasks assessed/performed Overall Cognitive Status: Within Functional Limits for tasks assessed                                     General Comments  Reinforced cervical care/prec, transition to EOB with log roll up from sidelying, lifting restrictions and progression of activity.  Donning underwear, use of reacher where needed.    Exercises     Shoulder Instructions      Home Living Family/patient expects to be discharged to:: Private residence Living Arrangements: Alone Available Help at Discharge: Friend(s);Available PRN/intermittently Type of  Home: House Home Access: Stairs to enter [employer] of Steps: 3 Entrance Stairs-Rails: Right;Left Home Layout: One level               Home Equipment: Cane - single point          Prior Functioning/Environment Level of Independence: Independent        Comments: working, driving, likes to [employer], former Financial controller at [employer] Problem List: Decreased range of motion;Decreased knowledge of use of DME or AE;Decreased knowledge of precautions      OT Treatment/Interventions:      OT Goals(Current goals can be found in the care plan section) Acute Rehab OT Goals Patient Stated Goal: home today OT Goal Formulation: With patient Time For Goal Achievement: 12/23/20 Potential to Achieve Goals: Good  OT Frequency:     Barriers to D/C:            Co-evaluation              AM-PAC OT "6 Clicks" Daily Activity     Outcome Measure Help from another person eating meals?: None Help from another person taking care of personal grooming?: None Help from another person toileting, which includes using toliet, bedpan, or urinal?: None Help from another person bathing (including washing, rinsing, drying)?: None Help from another person to put on and taking off regular upper body clothing?: None Help from another person to put on and taking off regular lower body clothing?: None 6 Click Score: 24   End of Session Nurse Communication: Mobility status  Activity Tolerance: Patient tolerated treatment well Patient left: in bed;with call bell/phone within reach  OT Visit Diagnosis: Other abnormalities of gait and mobility (R26.89)                Time: 1250-1306 OT Time Calculation (min): 16 min Charges:  OT General Charges $OT Visit: 1 Visit OT Evaluation $OT Eval Low Complexity: 1 Low  Nyoka Cowden OTR/L Acute Rehabilitation Services Pager: [phone] Office: [phone]  Evern Bio Gumecindo Hopkin 12/09/2020, 1:43 PM

## 2020-12-09 NOTE — H&P (Signed)
Surgical H&P Update  HPI: 65 y.o. 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 No changes in health since he was last seen. Still having the same radicular pain with numbness and paresthesias, wishes to proceed with surgery.  PMHx:  Past Medical History:  Diagnosis Date  . ADHD (attention deficit hyperactivity disorder)   . Ankylosing spondylitis (HCC)   . Hearing loss   . Hypertension   . OSA treated with BiPAP   . Restrictive lung disease   . Spondylarthritis    FamHx: History reviewed. No pertinent family history. SocHx:  reports that he has never smoked. He has never used smokeless tobacco. He reports previous alcohol use. He reports that he does not use drugs.  Physical Exam: AOx3, PERRL, FS, TM  Strength 5/5 x4, SILTx4 except L>R C7 numbness, healed anterior R transverse scar  Assesment/Plan: 65 y.o. man with prior C3-4/4-5/5-6 ACDF, here for C6-7 ACDF. Risks, benefits, and alternatives discussed and the patient would like to continue with surgery.  -OR today -3C post-op  Jadene Pierini, MD 12/09/20 7:48 AM

## 2020-12-09 NOTE — Evaluation (Signed)
Physical Therapy Evaluation Patient Details Name: Bill [name] MRN: [id number] DOB: [birth year] Today's Date: 12/09/2020   History of Present Illness  pt is a 65 y/o male presenting 3/24 with worsening L>R radiculopathy, s/p C6-7 ACDF.  PMHx: prior C3-4/4-5/5-6 ACDF, ADHD, Ankylosing spondylities, HTN, OSA  Clinical Impression  Pt is at or close to baseline functioning and should be safe at home with available assist from pt's friend. There are no further acute PT needs.  Will sign off at this time.     Follow Up Recommendations No PT follow up    Equipment Recommendations  None recommended by PT    Recommendations for Other Services       Precautions / Restrictions Precautions Precautions: Cervical Precaution Booklet Issued: Yes (comment) Restrictions Weight Bearing Restrictions: No      Mobility  Bed Mobility Overal bed mobility: Modified Independent                  Transfers Overall transfer level: Modified independent                  Ambulation/Gait Ambulation/Gait assistance: Modified independent (Device/Increase time) Gait Distance (Feet): 300 Feet Assistive device: None Gait Pattern/deviations: WFL(Within Functional Limits)   Gait velocity interpretation: 1.31 - 2.62 ft/sec, indicative of limited community ambulator    Stairs Stairs: Yes   Stair Management: One rail Right;Alternating pattern;Forwards Number of Stairs: 4 General stair comments: safe with rail at this point  Wheelchair Mobility    Modified Rankin (Stroke Patients Only)       Balance                                             Pertinent Vitals/Pain Pain Assessment: Faces Faces Pain Scale: Hurts a little bit Pain Location: surgical site area Pain Descriptors / Indicators: Penetrating;Sore Pain Intervention(s): Monitored during session    Home Living Family/patient expects to be discharged to:: Private residence Living Arrangements:  Alone Available Help at Discharge: Friend(s);Available PRN/intermittently Type of Home: House Home Access: Stairs to enter Entrance Stairs-Rails: Doctor, general practice of Steps: 3 Home Layout: One level Home Equipment: Cane - single point      Prior Function Level of Independence: Independent         Comments: working, driving     Higher education careers adviser        Extremity/Trunk Assessment   Upper Extremity Assessment Upper Extremity Assessment: Overall WFL for tasks assessed    Lower Extremity Assessment Lower Extremity Assessment: Overall WFL for tasks assessed    Cervical / Trunk Assessment Cervical / Trunk Assessment:  (anterior cervical surgical site)  Communication   Communication: No difficulties  Cognition Arousal/Alertness: Awake/alert Behavior During Therapy: WFL for tasks assessed/performed Overall Cognitive Status: Within Functional Limits for tasks assessed                                        General Comments General comments (skin integrity, edema, etc.): Reinforced cervical care/prec, transition to EOB with log roll up from sidelying, lifting restrictions and progression of activity.  Donning underwear, use of reacher where needed.    Exercises     Assessment/Plan    PT Assessment Patent does not need any further PT services  PT Problem List  PT Treatment Interventions      PT Goals (Current goals can be found in the Care Plan section)  Acute Rehab PT Goals Patient Stated Goal: home today PT Goal Formulation: All assessment and education complete, DC therapy    Frequency     Barriers to discharge        Co-evaluation               AM-PAC PT "6 Clicks" Mobility  Outcome Measure Help needed turning from your back to your side while in a flat bed without using bedrails?: None Help needed moving from lying on your back to sitting on the side of a flat bed without using bedrails?: None Help needed  moving to and from a bed to a chair (including a wheelchair)?: None Help needed standing up from a chair using your arms (e.g., wheelchair or bedside chair)?: None Help needed to walk in hospital room?: None Help needed climbing 3-5 steps with a railing? : None 6 Click Score: 24    End of Session   Activity Tolerance: Patient tolerated treatment well Patient left: in bed;with call bell/phone within reach Nurse Communication: Mobility status PT Visit Diagnosis: Other abnormalities of gait and mobility (R26.89);Pain Pain - part of body:  (anterior neck)    Time: 1205-1226 PT Time Calculation (min) (ACUTE ONLY): 21 min   Charges:   PT Evaluation $PT Eval Low Complexity: 1 Low          12/09/2020  Jacinto Halim., PT Acute Rehabilitation Services [phone]  (pager) [phone]  (office)  Eliseo Gum Arielys [place] 12/09/2020, 12:57 PM

## 2020-12-09 NOTE — Progress Notes (Signed)
Notified pt wants to go home, spoke with him about it. I strongly recommended he stay the night given his history of OSA and receiving anesthesia today, we discussed the risks of respiratory failure at length and he was able to repeat them back to me. He explained to me that he's had plenty of surgeries and knows the risks, he will sleep in a recliner with his CPAP mask on, is okay with the associated risks, and thinks he would do better at home. We discussed that risks of respiratory failure include death, to which he made the raspberry sound and told me it wouldn't happen. Will therefore place discharge orders for him.

## 2020-12-09 NOTE — Op Note (Signed)
PATIENT: Bill [name]  PROCEDURE DATE: 12/09/20  PRE-OPERATIVE DIAGNOSIS:  Cervical radiculopathy   POST-OPERATIVE DIAGNOSIS:  Cervical radiculopathy   PROCEDURE:  C6-C7 Anterior Cervical Discectomy and Instrumented Fusion   SURGEON:  Surgeon(s) and Role:    Jadene Pierini, MD - Primary    Coletta Memos, MD - Assisting   ANESTHESIA: ETGA   BRIEF HISTORY: This is a 65 year old man who presented with L>R C7 radiculopathy that was unfortunately not responsive to non-surgical treatment measures. An MRI showed adjacent segment disease with foraminal stenosis worse on the left than right at C6-7. Given his reported h/o AS and prior surgery, a CT was obtained that showed no evidence of OPLL and good arthrodesis of his construct. I therefore recommended an ACDF at C6-7. This was discussed with the patient as well as risks, benefits, and alternatives and the patient wished to proceed with surgical treatment.   OPERATIVE DETAIL: The patient was taken to the operating room and placed on the OR table in the supine position. A formal time out was performed with two patient identifiers and confirmed the operative site. Anesthesia was induced by the anesthesia team.  Fluoroscopy was used to localize the surgical level and an incision was marked in a skin crease. The area was then prepped and draped in a sterile fashion. The patient's prior right sided transverse linear incision was opened. The platysma was divided and the sternocleidomastoid muscle was identified. The carotid sheath was palpated, identified, and retracted laterally with the sternocleidomastoid muscle. The strap muscles were identified and retracted medially and the pretracheal fascia was entered. The longus colli were elevated bilaterally and a self-retaining retractor was placed. The endotracheal tube cuff balloon was deflated and reinflated after retractor placement.   The previous plate was visualized, the level was confirmed with  a spinal needle. The disc annulus was incised and a complete C6-C7 discectomy was performed. The posterior longitudinal ligament was incised followed by ligamentous and bony removal until no central canal stenosis was present. Decompression was then taken out laterally into the bilateral foramina until no foraminal stenosis was palpable. A 47mm cortical allograft (Medtronic) was inserted into the disc space as an interbody graft. I tried to place an anterior cervical plate but there was not room on the body above, so I drilled off the inferior portion of the plate and anterior vertebral body to decrease the amount of protrusion. I used significant irrigation to minimize any metal shavings left behind. An anterior plate (Medtronic) was then positioned and 4, 61mm screws were used to secure the plate to the C6 and C7 vertebral bodies and checked with fluoroscopy. Hemostasis was obtained and the incision was closed in layers. All instrument and sponge counts were correct. The patient was then returned to anesthesia for emergence. No apparent complications at the completion of the procedure.   EBL:  49mL   DRAINS: none   SPECIMENS: none   Jadene Pierini, MD 12/09/20 7:49 AM

## 2020-12-09 NOTE — Progress Notes (Signed)
Patient is discharged from room 3C03 at this time. Alert and in stable condition. IV site d/c'd and instructions read to patient with understanding verbalized and all questions answered. Left unit via wheelchair with all belongings at side. 

## 2020-12-09 NOTE — Transfer of Care (Signed)
Immediate Anesthesia Transfer of Care Note  Patient: Bill [name]  Procedure(s) Performed: Cervical Six-Seven Anterior Cervical Decompression/Discectomy Fusio (N/A Spine Cervical)  Patient Location: PACU  Anesthesia Type:General  Level of Consciousness: awake, alert  and oriented  Airway & Oxygen Therapy: Patient Spontanous Breathing and Patient connected to face mask oxygen  Post-op Assessment: Report given to RN, Post -op Vital signs reviewed and stable and Patient moving all extremities  Post vital signs: Reviewed and stable  Last Vitals:  Vitals Value Taken Time  BP 152/66 12/09/20 1052  Temp    Pulse 71 12/09/20 1053  Resp 24 12/09/20 1053  SpO2 99 % 12/09/20 1053  Vitals shown include unvalidated device data.  Last Pain:  Vitals:   12/09/20 0716  TempSrc:   PainSc: 5       Patients Stated Pain Goal: 3 (12/09/20 0716)  Complications: No complications documented.

## 2020-12-09 NOTE — Brief Op Note (Signed)
12/09/2020  10:46 AM  PATIENT:  Bill [name]  65 y.o. male  PRE-OPERATIVE DIAGNOSIS:  Cervical radiculopathy  POST-OPERATIVE DIAGNOSIS:  Cervical radiculopathy  PROCEDURE:  Procedure(s) with comments: Cervical Six-Seven Anterior Cervical Decompression/Discectomy Fusio (N/A) - 3C  SURGEON:  Surgeon(s) and Role:    * Tamira Ryland, Clovis Pu, MD - Primary    * Coletta Memos, MD - Assisting  PHYSICIAN ASSISTANT:   ASSISTANTSColetta Memos   ANESTHESIA:   general  EBL:  25cc  BLOOD ADMINISTERED:none  DRAINS: none   LOCAL MEDICATIONS USED:  LIDOCAINE   SPECIMEN:  No Specimen  DISPOSITION OF SPECIMEN:  N/A  COUNTS:  YES  TOURNIQUET:  * No tourniquets in log *  DICTATION: .Note written in EPIC  PLAN OF CARE: Admit for overnight observation  PATIENT DISPOSITION:  PACU - hemodynamically stable.   Delay start of Pharmacological VTE agent (>24hrs) due to surgical blood loss or risk of bleeding: yes

## 2020-12-09 NOTE — Progress Notes (Signed)
Neurosurgery Service Post-operative progress note  Assessment & Plan: 65 y.o. man s/p C6-7 ACDF, seen in PACU, MAEx4, radicular pain resolved.  -3C overnight for obs -activity as tolerated, no collar needed  Jadene Pierini  12/09/20 11:43 AM

## 2020-12-09 NOTE — Anesthesia Procedure Notes (Signed)
Procedure Name: Intubation Date/Time: 12/09/2020 8:16 AM Performed by: Colon Flattery, CRNA Pre-anesthesia Checklist: Patient identified, Emergency Drugs available, Suction available and Patient being monitored Patient Re-evaluated:Patient Re-evaluated prior to induction Oxygen Delivery Method: Circle system utilized Preoxygenation: Pre-oxygenation with 100% oxygen Induction Type: IV induction Ventilation: Mask ventilation without difficulty, Oral airway inserted - appropriate to patient size and Two handed mask ventilation required Laryngoscope Size: Glidescope and 4 Grade View: Grade I Tube type: Oral Tube size: 7.5 mm Number of attempts: 1 Airway Equipment and Method: Stylet and Oral airway Placement Confirmation: ETT inserted through vocal cords under direct vision,  positive ETCO2 and breath sounds checked- equal and bilateral Secured at: 23 cm Tube secured with: Tape Dental Injury: Teeth and Oropharynx as per pre-operative assessment

## 2020-12-10 NOTE — Anesthesia Postprocedure Evaluation (Signed)
Anesthesia Post Note  Patient: Bill [name]  Procedure(s) Performed: Cervical Six-Seven Anterior Cervical Decompression/Discectomy Fusio (N/A Spine Cervical)     Patient location during evaluation: PACU Anesthesia Type: General Level of consciousness: awake and alert Pain management: pain level controlled Vital Signs Assessment: post-procedure vital signs reviewed and stable Respiratory status: spontaneous breathing, nonlabored ventilation, respiratory function stable and patient connected to nasal cannula oxygen Cardiovascular status: blood pressure returned to baseline and stable Postop Assessment: no apparent nausea or vomiting Anesthetic complications: no   No complications documented.  Last Vitals:  Vitals:   12/09/20 1203 12/09/20 1541  BP: (!) 143/78 (!) 145/72  Pulse: 76 85  Resp: 20 17  Temp: 36.4 C 36.5 C  SpO2: 98% 96%    Last Pain:  Vitals:   12/09/20 1716  TempSrc:   PainSc: 4                  [name] S

## 2020-12-12 ENCOUNTER — Encounter (HOSPITAL_COMMUNITY): Payer: Self-pay | Admitting: Neurological Surgery

## 2021-08-16 ENCOUNTER — Other Ambulatory Visit: Payer: Self-pay | Admitting: Neurological Surgery

## 2021-08-16 ENCOUNTER — Other Ambulatory Visit (HOSPITAL_COMMUNITY): Payer: Self-pay | Admitting: Neurological Surgery

## 2021-08-16 DIAGNOSIS — M5412 Radiculopathy, cervical region: Secondary | ICD-10-CM

## 2021-08-22 ENCOUNTER — Other Ambulatory Visit: Payer: Self-pay

## 2021-08-22 ENCOUNTER — Ambulatory Visit (HOSPITAL_COMMUNITY)
Admission: RE | Admit: 2021-08-22 | Discharge: 2021-08-22 | Disposition: A | Discharge status: Home / Self Care | Payer: No Typology Code available for payment source | Source: Ambulatory Visit | Attending: Neurological Surgery | Admitting: Neurological Surgery

## 2021-08-22 DIAGNOSIS — M5412 Radiculopathy, cervical region: Secondary | ICD-10-CM | POA: Insufficient documentation

## 2021-08-22 IMAGING — CT CT CERVICAL SPINE W/O CM
3 of 4 series · 10 of 33 positions shown, 12 images · non-contrast
Comparison: CT of the cervical spine from [DATE].

EXAM:
CT CERVICAL SPINE WITHOUT CONTRAST
TECHNIQUE: Multidetector CT imaging of the cervical spine was performed without
intravenous contrast. Multiplanar CT image reconstructions were also
generated.

[Series 7: sagittal bone · sagittal · 0.25mm/px · 5 of 56 slices shown, 6 images]
[im 19/56  bone]
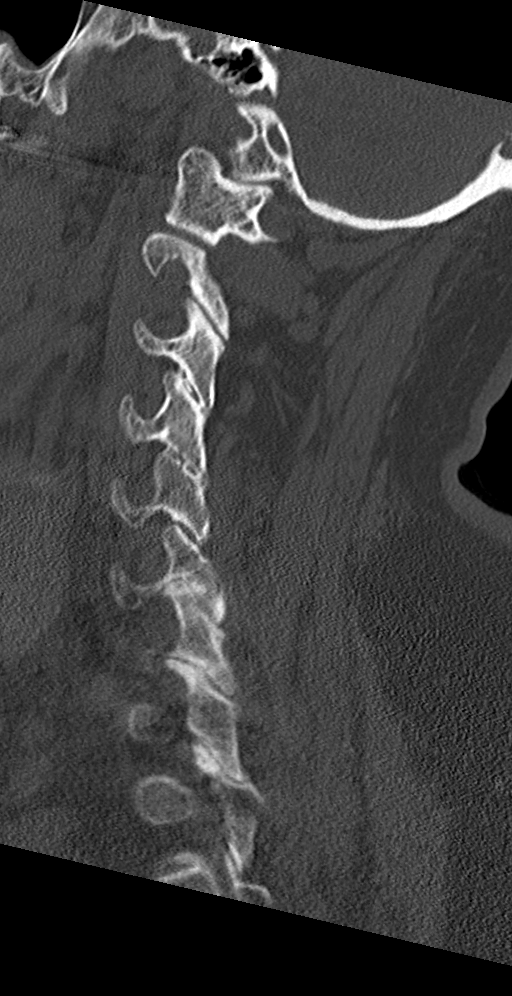
[im 23/56  bone]
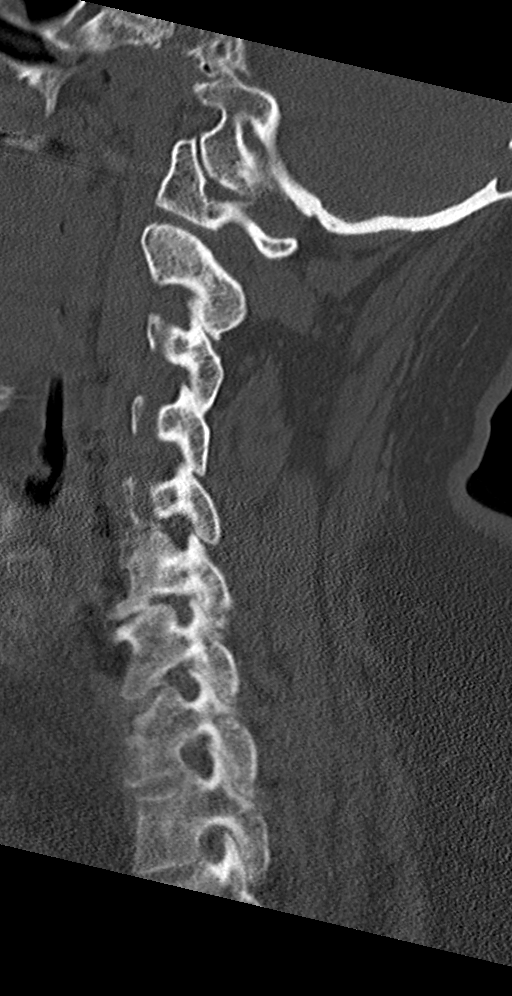
[im 28/56  soft-tissue]
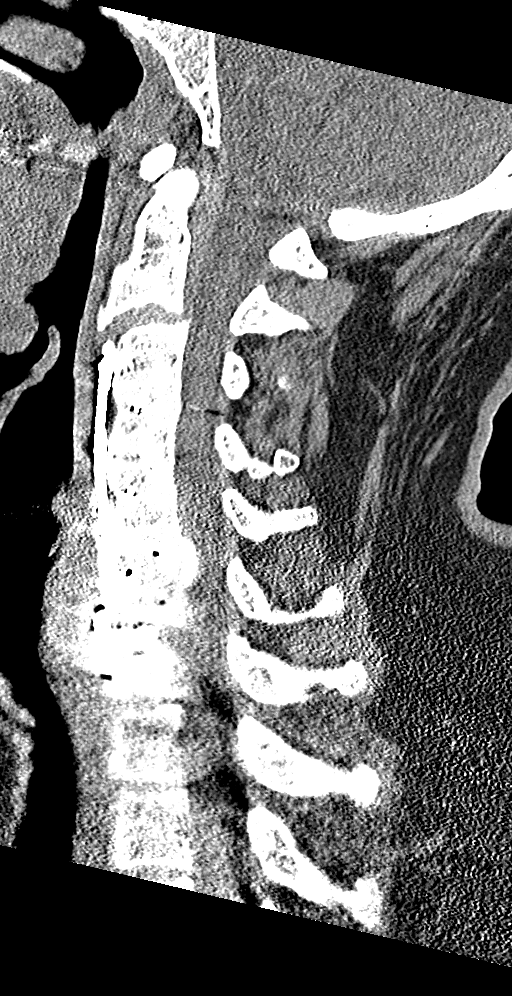
[im 28/56  bone]
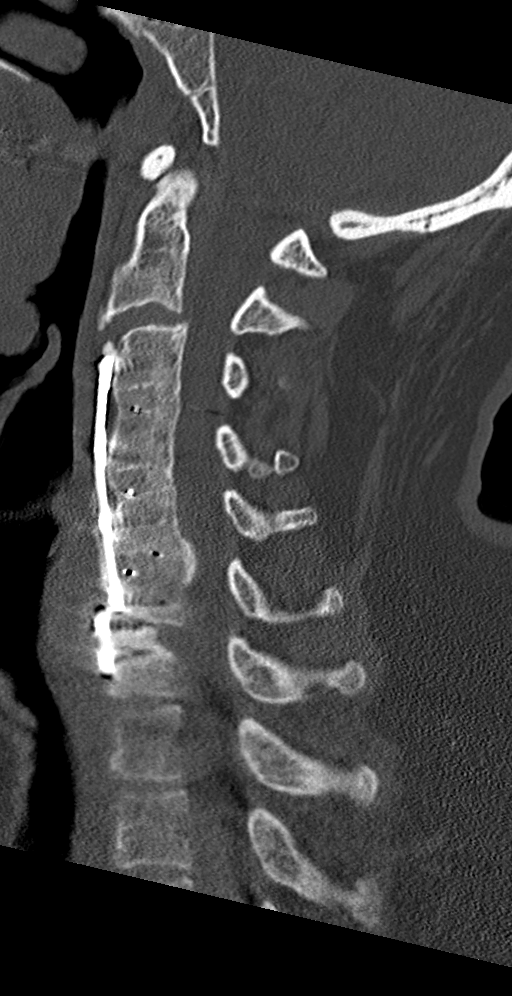
[im 33/56  bone]
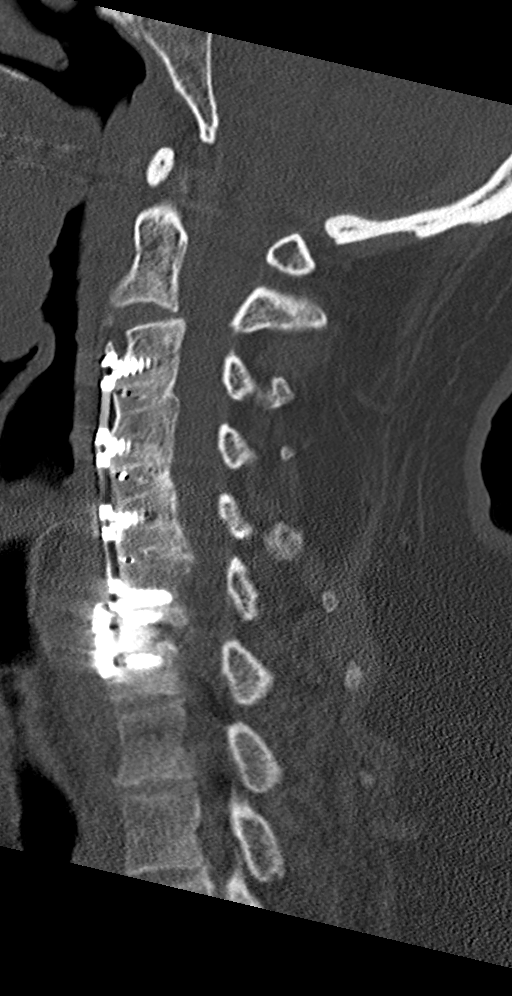
[im 37/56  bone]
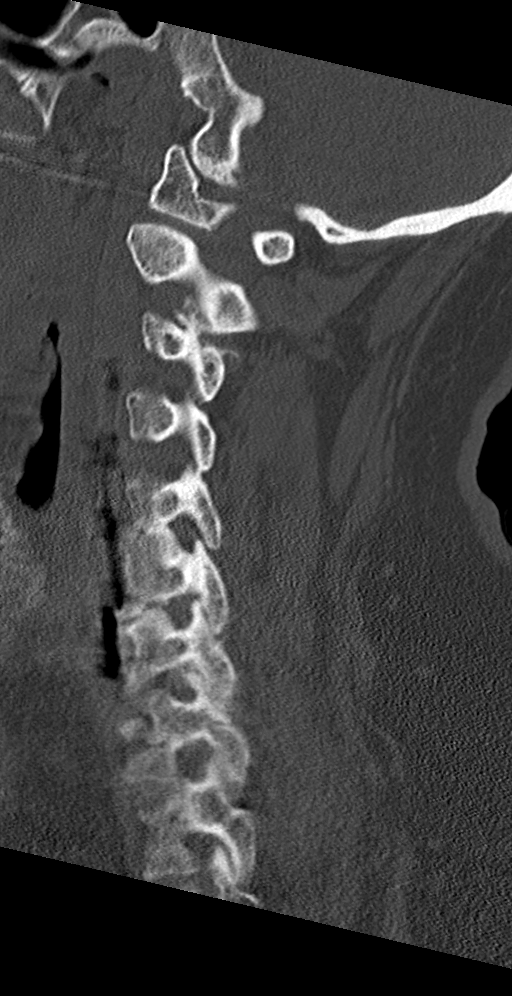

[Series 8: coronal bone · coronal · 0.40mm/px · 3 of 62 slices shown]
[im 13/62  bone]
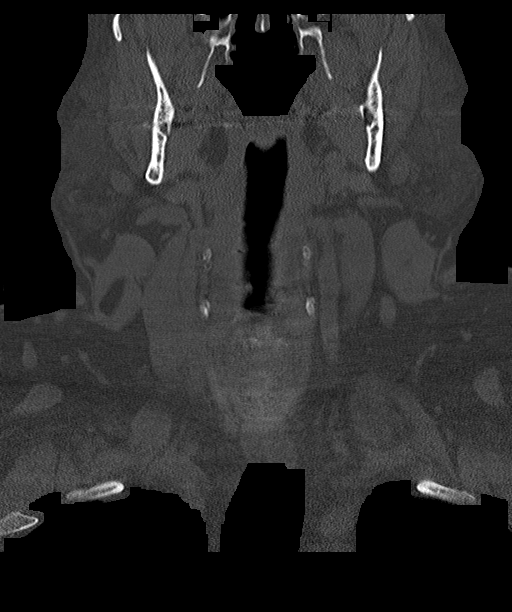
[im 25/62  bone]
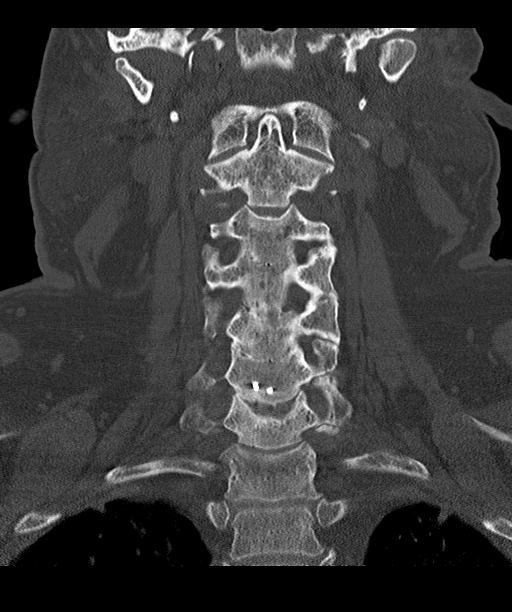
[im 37/62  bone]
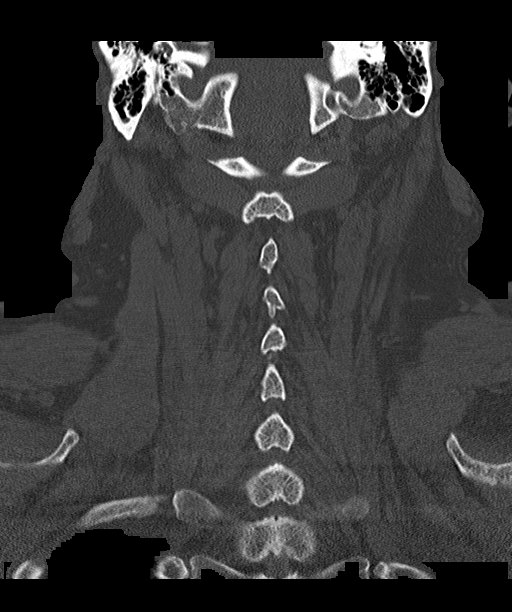

[Series 9: orthogonal axials · axial · 0.23mm/px · z∈[+1473,+1549]mm · 2 of 117 slices shown, 3 images]
[im 39/117  soft-tissue]
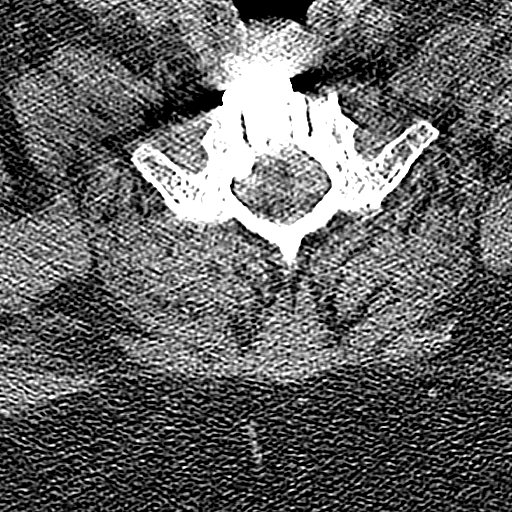
[im 39/117  bone]
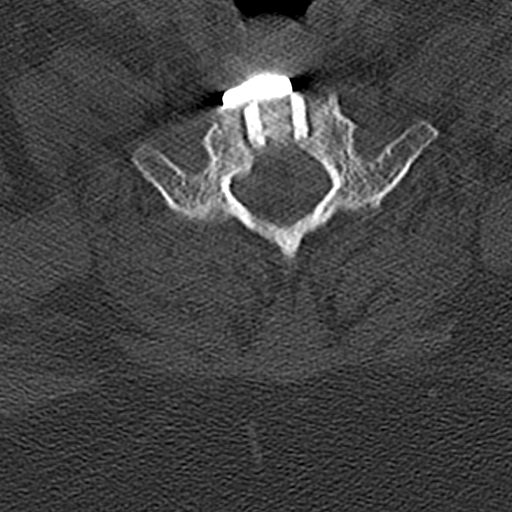
[im 78/117  bone]
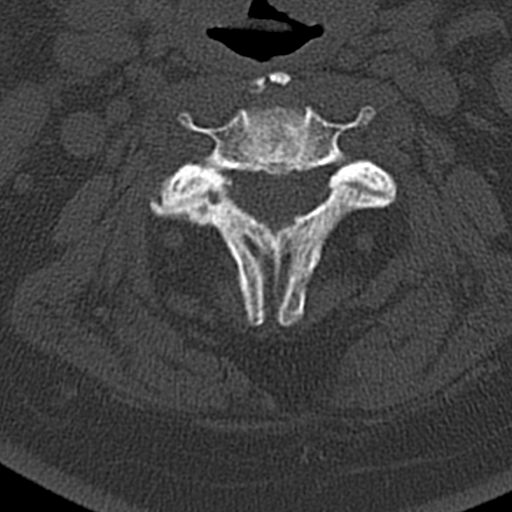

[10 of 33 positions shown; findings below may reference images not displayed]

FINDINGS: Alignment: Straightening of the normal cervical lordosis. Similar
approximately 2 mm of anterolisthesis of C2 on C3.

Skull base and vertebrae: C3-C6 ACDF. Interval C6-C7 ACDF. The left
superior C6 screw is partially within the inferior aspect of the C6
vertebral body, partially in the disc space. The right superior
screw appears within bone. No fusion across the C6-C7 disc space. No
evidence of hardware fracture. No lucency to suggest loosening. No
evidence of acute fracture. Vertebral body heights are maintained.

Soft tissues and spinal canal: No prevertebral fluid or swelling. No
visible canal hematoma.

Disc levels:

Streak artifact from hardware limits evaluation of the canal.

C2-C3: Facet and uncovertebral hypertrophy with mild bilateral bony
foraminal stenosis. Posterior disc bulge with likely mild canal
stenosis.

C3-C4: ACDF with solid bony fusion: Ossification of posterior
longitudinal ligament. No evidence of high-grade bony canal or
foraminal stenosis.

C4-C5: ACDF with solid bony fusion. No evidence of significant bony
canal or foraminal stenosis.

C5-C6: ACDF with solid bony fusion. Osteophytic spurring along the
posterior aspect of the disc space. Resulting effacement of the
ventral CSF with likely mild canal stenosis, which appears similar
to the prior. Similar bilateral uncovertebral hypertrophy with
likely mild bilateral foraminal stenosis.

C6-C7: Interval ACDF. Left greater than right uncovertebral
hypertrophy with potentially a moderate left foraminal stenosis.
Streak artifact limits evaluation; however, there appears to be soft
tissue within the ventral canal (for example: Series 4, image 76),
concerning for large disc herniation. Suspected severe resulting
canal stenosis.

Upper chest: Visualized lung apices are clear.
IMPRESSION: 1. Streak artifact limits evaluation of the canal with suspected
large disc herniation at C6-C7 and potentially severe canal
stenosis. If the patient is able, recommend an MRI to confirm and
better characterize the canal/cord.
2. Interval C6-C7 ACDF without evidence of bony fusion. The left
superior C6 screw is partially within the inferior aspect of the C6
vertebral body, partially in the disc space.
3. Prior solid C3-C6 ACDF. Similar bony degenerative changes at
these levels, as detailed above.

These results will be called to the ordering clinician or
representative by the Radiologist Assistant, and communication
documented in the PACS or [REDACTED].

## 2021-09-02 ENCOUNTER — Other Ambulatory Visit: Payer: Self-pay | Admitting: Neurological Surgery

## 2021-09-15 ENCOUNTER — Encounter (HOSPITAL_COMMUNITY): Payer: Self-pay

## 2021-09-15 NOTE — Progress Notes (Signed)
Anesthesia Chart Review:  Follows with cardiology for history of CAD s/p NSTEMI treated with DES to OM1 in 2016.  Nuclear stress March 2019 was nonischemic.  Last seen by Lorelle Gibbs, PA-C at the [facility] 01/18/21 and noted to be doing well at that time, 1 year follow-up recommended.  Per note, "CAD s/p NSTEMI, DES to OM1: Stable, no c/o CP or SOB. No restrictions to activities. Continue on aspirin, metoprolol and statin."  In March of this year patient was cleared by cardiology at the [facility] to undergo ACDF which she had on 12/09/2020 without complication.  Patient denied any cardiopulmonary complaints at preop testing visit, states he feels well from cardiac standpoint, no changes from when last seen in follow-up.  History of restrictive lung physiology secondary to DISH and BMI of 44. OSA on BiPAP. 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  Status post C3-7 ACDF, elective Glide scope has been used for intubation.  Preop labs reviewed, creatinine mildly elevated 1.57 (baseline appears to be around 1.30), WBC mildly elevated at 14.3, otherwise unremarkable.  EKG 11/19/2020: NSR.  Rate 67.  Cardiac testing per Lorelle Gibbs, PA-C's note 01/18/2021:  -Nuclear medicine stress testing 11/29/2017 Impression: 1. No evidence of reversible ischemia.   -2d TTE with color flow and Doppler performed 11/26/17 Mild left ventricular hypertrophy Normal left ventricular size and function, EF 55-60% Grade I diastolic dysfunction Normal right ventricular size and function No significant valvular regurgitation or stenosis IVC was not visualized E/e'=7 suggests normal left atrial pressure No pericardial effusion In comparison with the images of 07/19/2015 there has been no significant change    Zannie Cove [facility]/Anesthesiology Phone [phone] 09/16/2021 11:48 AM

## 2021-09-15 NOTE — Progress Notes (Signed)
Surgical Instructions   Your procedure is scheduled on Tuesday 09/20/2021.  Report to [facility] Main Entrance "A" at 10:45 A.M., then check in with the Admitting office.  Call [phone] if you have problems or questions between now and the morning of surgery:   Remember: Do not eat after midnight the night before your surgery  You may drink clear liquids until 09:50 a.m. the morning of your surgery.   Clear liquids allowed are: Water, Non-Citrus Juices (without pulp), Carbonated Beverages, Clear Tea, Black Coffee Only (NO MILK, CREAM, or POWDERED CREAMER of any kind), and Gatorade   Take these medicines the morning of surgery with A SIP OF WATER:  Allopurinol (Zyloprim) Cetirizine (Zyrtec) Metorpolol Tartrate (Lopressor)  If needed you may take these medications the morning of surgery: Colchicine   As of today, STOP taking any Aspirin (unless otherwise instructed by your surgeon) or Aspirin-containing products; NSAIDS - Aleve, Naproxen, Ibuprofen, Motrin, Advil, Goody's, BC's, all herbal medications, fish oil, and all vitamins.  Follow your surgeon's instructions on when to stop Aspirin.  If no instructions were given by your surgeon then you will need to call the office to get those instructions.      3 days leading up to your surgery OR after your pre-procedure COVID test You are not required to quarantine however you are required to wear a well-fitting mask when you are out and around people not in your household.  If your mask becomes wet or soiled, replace with a new one.  Wash your hands often with soap and water for 20 seconds or clean your hands with an alcohol-based hand sanitizer that contains at least 60% alcohol.  Do not share personal items.  Notify your provider: if you are in close contact with someone who has COVID  or if you develop a fever of 100.4 or greater, sneezing, cough, sore throat, shortness of breath or body aches.          Do not wear jewelry    Do not wear lotions, powders, colognes, or deodorant.  Do not shave 48 hours prior to surgery.  Men may shave face and neck.  Do not bring valuables to the hospital - [facility] is not responsible for any belongings or valuables.  Do NOT Smoke (Tobacco/Vaping) or drink Alcohol 24 hours prior to your procedure  If you use a CPAP at night, please bring your mask for your overnight stay.   Contacts, glasses, hearing aids, dentures or partials may not be worn into surgery, please bring cases for these belongings   For patients admitted to the hospital, discharge time will be determined by your treatment team.   Patients discharged the day of surgery will not be allowed to drive home, and someone needs to stay with them for 24 hours.  NO VISITORS WILL BE ALLOWED IN PRE-OP WHERE PATIENTS ARE PREPPED FOR SURGERY.  ONLY 1 SUPPORT PERSON MAY BE PRESENT IN THE WAITING ROOM WHILE YOU ARE IN SURGERY.  IF YOU ARE TO BE ADMITTED, ONCE YOU ARE IN YOUR ROOM YOU WILL BE ALLOWED TWO (2) VISITORS. 1 (ONE) VISITOR MAY STAY OVERNIGHT BUT MUST ARRIVE TO THE ROOM BY 8pm.  Minor children may have two parents present. Special consideration for safety and communication needs will be reviewed on a case by case basis.  Special instructions:    Oral Hygiene is also important to reduce your risk of infection.  Remember - BRUSH YOUR TEETH THE MORNING OF SURGERY WITH YOUR REGULAR TOOTHPASTE  [place]- Preparing For Surgery  Before surgery, you can play an important role. Because skin is not sterile, your skin needs to be as free of germs as possible. You can reduce the number of germs on your skin by washing with CHG (chlorahexidine gluconate) Soap before surgery.  CHG is an antiseptic cleaner which kills germs and bonds with the skin to continue killing germs even after washing.     Please do not use if you have an allergy to CHG or antibacterial soaps. If your skin becomes reddened/irritated stop using the  CHG.  Do not shave (including legs and underarms) for at least 48 hours prior to first CHG shower. It is OK to shave your face.  Please follow these instructions carefully.     Shower the NIGHT BEFORE SURGERY and the MORNING OF SURGERY with CHG Soap.   If you chose to wash your hair, wash your hair first as usual with your normal shampoo. After you shampoo, rinse your hair and body thoroughly to remove the shampoo.    Then [employer] and genitals (private parts) with your normal soap and rinse thoroughly to remove soap.  Next use the CHG Soap as you would any other liquid soap. You can apply CHG directly to the skin and wash gently with a clean washcloth.   Apply the CHG Soap to your body ONLY FROM THE NECK DOWN.  Do not use on open wounds or open sores. Avoid contact with your eyes, ears, mouth and genitals (private parts). Wash Face and genitals (private parts)  with your normal soap.   Wash thoroughly, paying special attention to the area where your surgery will be performed.  Thoroughly rinse your body with warm water from the neck down.  DO NOT shower/wash with your normal soap after using and rinsing off the CHG Soap.  Pat yourself dry with a CLEAN TOWEL.  Wear CLEAN PAJAMAS to bed the night before surgery  Place CLEAN SHEETS on your bed the night before your surgery  DO NOT SLEEP WITH PETS.   Day of Surgery:  Take a shower with CHG soap. Wear Clean/Comfortable clothing the morning of surgery Do not apply any deodorants/lotions.   Remember to brush your teeth WITH YOUR REGULAR TOOTHPASTE.   Please read over the fact sheets that you were given.

## 2021-09-16 ENCOUNTER — Encounter (HOSPITAL_COMMUNITY)
Admission: RE | Admit: 2021-09-16 | Discharge: 2021-09-16 | Disposition: A | Discharge status: Home / Self Care | Payer: No Typology Code available for payment source | Source: Ambulatory Visit | Attending: Neurological Surgery | Admitting: Neurological Surgery

## 2021-09-16 ENCOUNTER — Other Ambulatory Visit: Payer: Self-pay

## 2021-09-16 ENCOUNTER — Encounter (HOSPITAL_COMMUNITY): Payer: Self-pay

## 2021-09-16 VITALS — BP 123/72 | HR 83 | Temp 98.5°F | Resp 17 | Ht 70.0 in | Wt 303.0 lb

## 2021-09-16 DIAGNOSIS — Z01812 Encounter for preprocedural laboratory examination: Secondary | ICD-10-CM | POA: Insufficient documentation

## 2021-09-16 DIAGNOSIS — Z01818 Encounter for other preprocedural examination: Secondary | ICD-10-CM

## 2021-09-16 DIAGNOSIS — Z20822 Contact with and (suspected) exposure to covid-19: Secondary | ICD-10-CM | POA: Insufficient documentation

## 2021-09-16 HISTORY — DX: Depression, unspecified: F32.A

## 2021-09-16 HISTORY — DX: Anemia, unspecified: D64.9

## 2021-09-16 HISTORY — DX: Gout, unspecified: M10.9

## 2021-09-16 HISTORY — DX: Atherosclerotic heart disease of native coronary artery without angina pectoris: I25.10

## 2021-09-16 HISTORY — DX: Chronic kidney disease, unspecified: N18.9

## 2021-09-16 HISTORY — DX: Hyperlipidemia, unspecified: E78.5

## 2021-09-16 HISTORY — DX: Body mass index (BMI) 40.0-44.9, adult: E66.01

## 2021-09-16 HISTORY — DX: Acute myocardial infarction, unspecified: I21.9

## 2021-09-16 LAB — BASIC METABOLIC PANEL
Anion gap: 6 (ref 5–15)
BUN: 27 mg/dL — ABNORMAL HIGH (ref 8–23)
CO2: 28 mmol/L (ref 22–32)
Calcium: 9.3 mg/dL (ref 8.9–10.3)
Chloride: 100 mmol/L (ref 98–111)
Creatinine, Ser: 1.57 mg/dL — ABNORMAL HIGH (ref 0.61–1.24)
GFR, Estimated: 49 mL/min — ABNORMAL LOW (ref >60)
Glucose, Bld: 129 mg/dL — ABNORMAL HIGH (ref 70–99)
Potassium: 4.6 mmol/L (ref 3.5–5.1)
Sodium: 134 mmol/L — ABNORMAL LOW (ref 135–145)

## 2021-09-16 LAB — TYPE AND SCREEN
ABO/RH(D): O POS
Antibody Screen: NEGATIVE

## 2021-09-16 LAB — SARS CORONAVIRUS 2 (TAT 6-24 HRS): SARS Coronavirus 2: NEGATIVE

## 2021-09-16 LAB — CBC
HCT: 42.4 % (ref 39.0–52.0)
Hemoglobin: 13.6 g/dL (ref 13.0–17.0)
MCH: 30.8 pg (ref 26.0–34.0)
MCHC: 32.1 g/dL (ref 30.0–36.0)
MCV: 96.1 fL (ref 80.0–100.0)
Platelets: 363 10*3/uL (ref 150–400)
RBC: 4.41 MIL/uL (ref 4.22–5.81)
RDW: 14 % (ref 11.5–15.5)
WBC: 14.3 10*3/uL — ABNORMAL HIGH (ref 4.0–10.5)
nRBC: 0 % (ref 0.0–0.2)

## 2021-09-16 LAB — SURGICAL PCR SCREEN
MRSA, PCR: NEGATIVE
Staphylococcus aureus: POSITIVE — AB

## 2021-09-16 NOTE — Anesthesia Preprocedure Evaluation (Addendum)
Anesthesia Evaluation  Patient identified by MRN, date of birth, ID band Patient awake    Airway Mallampati: II  TM Distance: >3 FB     Dental   Pulmonary sleep apnea ,    breath sounds clear to auscultation       Cardiovascular hypertension, + CAD and + Past MI   Rhythm:Regular Rate:Normal     Neuro/Psych PSYCHIATRIC DISORDERS  Neuromuscular disease    GI/Hepatic negative GI ROS, Neg liver ROS,   Endo/Other    Renal/GU Renal disease     Musculoskeletal  (+) Arthritis ,   Abdominal   Peds  Hematology   Anesthesia Other Findings   Reproductive/Obstetrics                            Anesthesia Physical Anesthesia Plan  ASA: 3  Anesthesia Plan: General   Post-op Pain Management:    Induction: Intravenous  PONV Risk Score and Plan: 2 and Ondansetron, Dexamethasone and Midazolam  Airway Management Planned: Oral ETT  Additional Equipment: Arterial line, CVP, TEE and Ultrasound Guidance Line Placement  Intra-op Plan:   Post-operative Plan: Post-operative intubation/ventilation  Informed Consent: I have reviewed the patients History and Physical, chart, labs and discussed the procedure including the risks, benefits and alternatives for the proposed anesthesia with the patient or authorized representative who has indicated his/her understanding and acceptance.     Dental advisory given  Plan Discussed with: CRNA and Anesthesiologist  Anesthesia Plan Comments: (PAT note by Antionette Poles, PA-C: Follows with cardiology for history of CAD s/p NSTEMI treated with DES to OM1 in 2016.  Nuclear stress March 2019 was nonischemic.  Last seen by Lorelle Gibbs, PA-C at the [facility] 01/18/21 and noted to be doing well at that time, 1 year follow-up recommended.  Per note, "CAD s/p NSTEMI, DES to OM1: Stable, no c/o CP or SOB. No restrictions to activities. Continue on aspirin, metoprolol and statin."  In March of  this year patient was cleared by cardiology at the [facility] to undergo ACDF which she had on 12/09/2020 without complication.  Patient denied any cardiopulmonary complaints at preop testing visit, states he feels well from cardiac standpoint, no changes from when last seen in follow-up.  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 OSA on BiPAP. 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  Status post C3-7 ACDF, elective Glide scope has been used for intubation.  Preop labs reviewed, creatinine mildly elevated 1.57 (baseline appears to be around 1.30), WBC mildly elevated at 14.3, otherwise unremarkable.  EKG 11/19/2020: NSR.  Rate 67.  Cardiac testing per Lorelle Gibbs, PA-C's note 01/18/2021:  -Nuclear medicine stress testing 11/29/2017 Impression: 1. No evidence of reversible ischemia.   -2d TTE with color flow and Doppler performed 11/26/17 Mild left ventricular hypertrophy Normal left ventricular size and function, EF 55-60% Grade I diastolic dysfunction Normal right ventricular size and function No significant valvular regurgitation or stenosis IVC was not visualized E/e'=7 suggests normal left atrial pressure No pericardial effusion In comparison with the images of 07/19/2015 there has been no significant change   )       Anesthesia Quick Evaluation

## 2021-09-16 NOTE — Progress Notes (Signed)
[facility] - [facility] Cardiologist - [facility]  PPM/ICD - n/a Device Orders -  Rep Notified -   Chest x-ray - n/a EKG - 12/09/20 Stress Test - 11/29/2017 ECHO - 2012 Cardiac Cath - 05/07/2017  Sleep Study - patient has OSA CPAP - wears BiPAP nightly  Fasting Blood Sugar - Checks Blood Sugar _____ times a day  Blood Thinner Instructions: Aspirin Instructions: last dose of ASA was on 09/10/2021  ERAS Protcol - clears until 0950 PRE-SURGERY Ensure or G2- none ordered  COVID TEST- done at PAT   Anesthesia review: yes, given to [facility]  Patient denies shortness of breath, fever, cough and chest pain at PAT appointment.  Patient reports feeling well from a cardiac standpoint   All instructions explained to the patient, with a verbal understanding of the material. Patient agrees to go over the instructions while at home for a better understanding. Patient also instructed to mask when out in public after being tested for COVID-19. The opportunity to ask questions was provided.

## 2021-09-20 ENCOUNTER — Inpatient Hospital Stay (HOSPITAL_COMMUNITY): Payer: No Typology Code available for payment source | Admitting: Physician Assistant

## 2021-09-20 ENCOUNTER — Inpatient Hospital Stay (HOSPITAL_COMMUNITY): Payer: No Typology Code available for payment source | Admitting: Certified Registered"

## 2021-09-20 ENCOUNTER — Other Ambulatory Visit: Payer: Self-pay

## 2021-09-20 ENCOUNTER — Encounter (HOSPITAL_COMMUNITY): Payer: Self-pay | Admitting: Neurological Surgery

## 2021-09-20 ENCOUNTER — Encounter (HOSPITAL_COMMUNITY)
Admission: RE | Disposition: A | Discharge status: Home / Self Care | Payer: Self-pay | Source: Home / Self Care | Attending: Neurological Surgery

## 2021-09-20 ENCOUNTER — Inpatient Hospital Stay (HOSPITAL_COMMUNITY)
Admission: RE | Admit: 2021-09-20 | Discharge: 2021-09-21 | DRG: 472 | Disposition: A | Discharge status: Home / Self Care | Payer: No Typology Code available for payment source | Attending: Neurological Surgery | Admitting: Neurological Surgery

## 2021-09-20 ENCOUNTER — Inpatient Hospital Stay (HOSPITAL_COMMUNITY): Payer: No Typology Code available for payment source

## 2021-09-20 DIAGNOSIS — G4733 Obstructive sleep apnea (adult) (pediatric): Secondary | ICD-10-CM | POA: Diagnosis present

## 2021-09-20 DIAGNOSIS — M542 Cervicalgia: Secondary | ICD-10-CM | POA: Diagnosis present

## 2021-09-20 DIAGNOSIS — Z6841 Body Mass Index (BMI) 40.0 and over, adult: Secondary | ICD-10-CM

## 2021-09-20 DIAGNOSIS — Z20822 Contact with and (suspected) exposure to covid-19: Secondary | ICD-10-CM | POA: Diagnosis present

## 2021-09-20 DIAGNOSIS — I10 Essential (primary) hypertension: Secondary | ICD-10-CM | POA: Diagnosis present

## 2021-09-20 DIAGNOSIS — E785 Hyperlipidemia, unspecified: Secondary | ICD-10-CM | POA: Diagnosis present

## 2021-09-20 DIAGNOSIS — M109 Gout, unspecified: Secondary | ICD-10-CM | POA: Diagnosis present

## 2021-09-20 DIAGNOSIS — F32A Depression, unspecified: Secondary | ICD-10-CM | POA: Diagnosis present

## 2021-09-20 DIAGNOSIS — H9190 Unspecified hearing loss, unspecified ear: Secondary | ICD-10-CM | POA: Diagnosis present

## 2021-09-20 DIAGNOSIS — F909 Attention-deficit hyperactivity disorder, unspecified type: Secondary | ICD-10-CM | POA: Diagnosis present

## 2021-09-20 DIAGNOSIS — M960 Pseudarthrosis after fusion or arthrodesis: Secondary | ICD-10-CM | POA: Diagnosis present

## 2021-09-20 DIAGNOSIS — Z981 Arthrodesis status: Secondary | ICD-10-CM | POA: Diagnosis not present

## 2021-09-20 DIAGNOSIS — I252 Old myocardial infarction: Secondary | ICD-10-CM

## 2021-09-20 DIAGNOSIS — M452 Ankylosing spondylitis of cervical region: Secondary | ICD-10-CM | POA: Diagnosis present

## 2021-09-20 DIAGNOSIS — Z419 Encounter for procedure for purposes other than remedying health state, unspecified: Secondary | ICD-10-CM

## 2021-09-20 DIAGNOSIS — I2510 Atherosclerotic heart disease of native coronary artery without angina pectoris: Secondary | ICD-10-CM | POA: Diagnosis present

## 2021-09-20 DIAGNOSIS — Y838 Other surgical procedures as the cause of abnormal reaction of the patient, or of later complication, without mention of misadventure at the time of the procedure: Secondary | ICD-10-CM | POA: Diagnosis present

## 2021-09-20 DIAGNOSIS — J984 Other disorders of lung: Secondary | ICD-10-CM | POA: Diagnosis present

## 2021-09-20 DIAGNOSIS — S129XXA Fracture of neck, unspecified, initial encounter: Secondary | ICD-10-CM | POA: Diagnosis present

## 2021-09-20 HISTORY — PX: POSTERIOR CERVICAL FUSION/FORAMINOTOMY: SHX5038

## 2021-09-20 IMAGING — RF DG CERVICAL SPINE 2 OR 3 VIEWS
1 series · 1 of 1 positions shown · non-contrast
Comparison: [DATE]

CLINICAL DATA: Localization for C5 through C7 posterior fusion

EXAM:
CERVICAL SPINE - 2-3 VIEW

[Series 1: run · 1 of 1 slices shown]
[im 1/1]
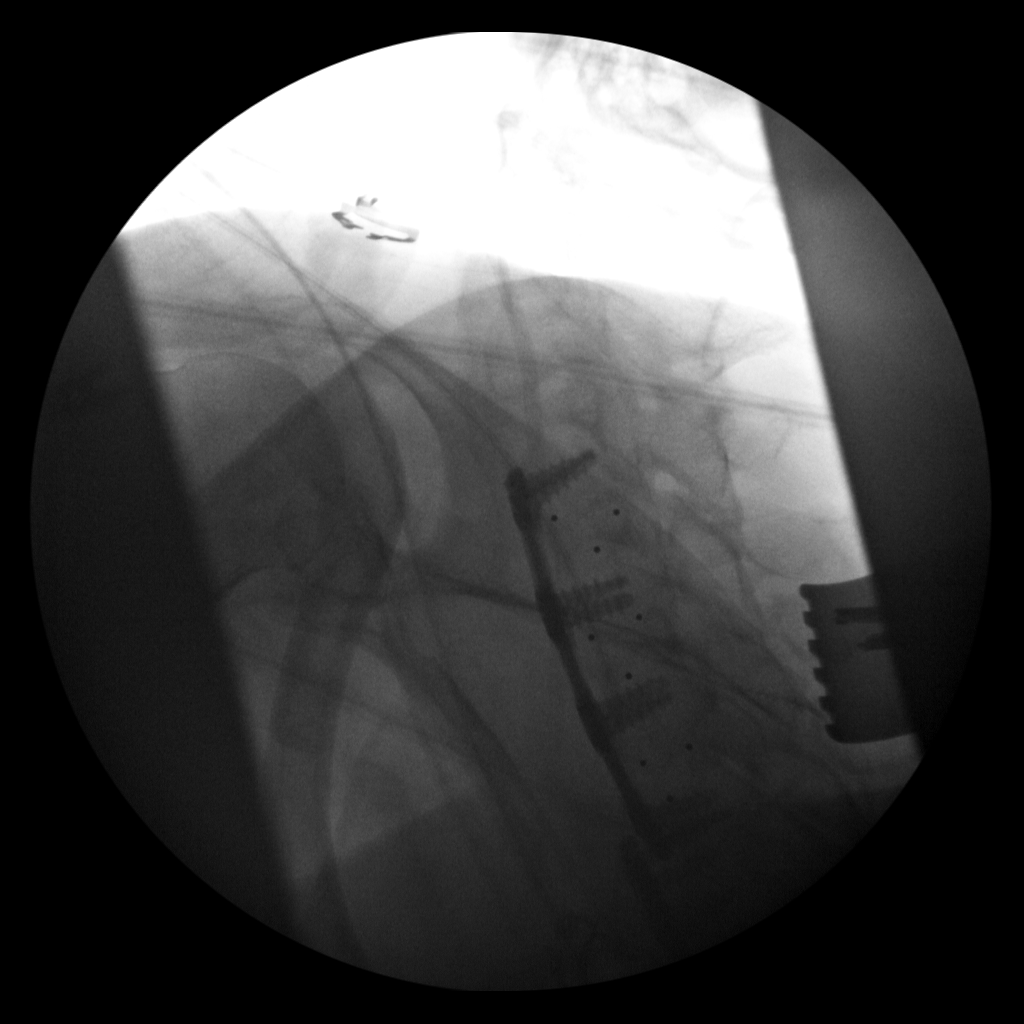

[1 of 1 positions shown; findings below may reference images not displayed]

FINDINGS: Single lateral image of the cervical spine was obtained.

Pre-existing ACDF C3 through C7. Posterior tissue spreaders are
present centered at the C5 level.
IMPRESSION: Localization of the cervical spine in the operating room.

## 2021-09-20 SURGERY — POSTERIOR CERVICAL FUSION/FORAMINOTOMY LEVEL 2
Anesthesia: General | Site: Spine Cervical

## 2021-09-20 MED ORDER — LACTATED RINGERS IV SOLN: INTRAVENOUS | Status: DC

## 2021-09-20 MED ORDER — PHENYLEPHRINE HCL-NACL 20-0.9 MG/250ML-% IV SOLN
INTRAVENOUS | Status: DC | PRN
  Administered 2021-09-20: 50 ug/min via INTRAVENOUS

## 2021-09-20 MED ORDER — LIDOCAINE-EPINEPHRINE 1 %-1:100000 IJ SOLN
INTRAMUSCULAR | Status: AC
  Filled 2021-09-20: qty 1

## 2021-09-20 MED ORDER — CHLORHEXIDINE GLUCONATE CLOTH 2 % EX PADS: 6.0000 | MEDICATED_PAD | Freq: Once | CUTANEOUS | Status: DC

## 2021-09-20 MED ORDER — METOPROLOL TARTRATE 50 MG PO TABS
50.0000 mg | ORAL_TABLET | Freq: Every day | ORAL | Status: DC
  Administered 2021-09-21: 50 mg via ORAL
  Filled 2021-09-20: qty 1

## 2021-09-20 MED ORDER — SUCCINYLCHOLINE CHLORIDE 200 MG/10ML IV SOSY
PREFILLED_SYRINGE | INTRAVENOUS | Status: DC | PRN
  Administered 2021-09-20: 140 mg via INTRAVENOUS

## 2021-09-20 MED ORDER — EPHEDRINE SULFATE-NACL 50-0.9 MG/10ML-% IV SOSY
PREFILLED_SYRINGE | INTRAVENOUS | Status: DC | PRN
  Administered 2021-09-20 (×3): 5 mg via INTRAVENOUS

## 2021-09-20 MED ORDER — OXYCODONE HCL 5 MG PO TABS
5.0000 mg | ORAL_TABLET | ORAL | Status: DC | PRN
  Administered 2021-09-20 – 2021-09-21 (×2): 5 mg via ORAL
  Filled 2021-09-20: qty 1

## 2021-09-20 MED ORDER — DOCUSATE SODIUM 100 MG PO CAPS
100.0000 mg | ORAL_CAPSULE | Freq: Two times a day (BID) | ORAL | Status: DC
  Administered 2021-09-20 – 2021-09-21 (×2): 100 mg via ORAL
  Filled 2021-09-20 (×2): qty 1

## 2021-09-20 MED ORDER — CEFAZOLIN SODIUM 1 G IJ SOLR
INTRAMUSCULAR | Status: AC
  Filled 2021-09-20: qty 10

## 2021-09-20 MED ORDER — OXYCODONE HCL 5 MG PO TABS
ORAL_TABLET | ORAL | Status: AC
  Filled 2021-09-20: qty 1

## 2021-09-20 MED ORDER — ATORVASTATIN CALCIUM 10 MG PO TABS
20.0000 mg | ORAL_TABLET | Freq: Every day | ORAL | Status: DC
  Administered 2021-09-20: 20 mg via ORAL
  Filled 2021-09-20: qty 2

## 2021-09-20 MED ORDER — BACITRACIN ZINC 500 UNIT/GM EX OINT
TOPICAL_OINTMENT | CUTANEOUS | Status: AC
  Filled 2021-09-20: qty 28.35

## 2021-09-20 MED ORDER — MIDAZOLAM HCL 2 MG/2ML IJ SOLN
INTRAMUSCULAR | Status: AC
  Filled 2021-09-20: qty 2

## 2021-09-20 MED ORDER — SUGAMMADEX SODIUM 200 MG/2ML IV SOLN
INTRAVENOUS | Status: DC | PRN
Start: 2021-09-20 — End: 2021-09-20
  Administered 2021-09-20: 400 mg via INTRAVENOUS

## 2021-09-20 MED ORDER — COLCHICINE 0.6 MG PO TABS
0.6000 mg | ORAL_TABLET | Freq: Every day | ORAL | Status: DC | PRN
  Filled 2021-09-20: qty 1

## 2021-09-20 MED ORDER — MIDAZOLAM HCL 2 MG/2ML IJ SOLN
INTRAMUSCULAR | Status: DC | PRN
Start: 2021-09-20 — End: 2021-09-20
  Administered 2021-09-20 (×2): 1 mg via INTRAVENOUS

## 2021-09-20 MED ORDER — THROMBIN 5000 UNITS EX SOLR
CUTANEOUS | Status: AC
  Filled 2021-09-20: qty 5000

## 2021-09-20 MED ORDER — LIDOCAINE-EPINEPHRINE 1 %-1:100000 IJ SOLN
INTRAMUSCULAR | Status: DC | PRN
  Administered 2021-09-20: 10 mL

## 2021-09-20 MED ORDER — DEXAMETHASONE SODIUM PHOSPHATE 10 MG/ML IJ SOLN
INTRAMUSCULAR | Status: DC | PRN
Start: 2021-09-20 — End: 2021-09-20
  Administered 2021-09-20: 10 mg via INTRAVENOUS

## 2021-09-20 MED ORDER — PROPOFOL 10 MG/ML IV BOLUS
INTRAVENOUS | Status: AC
  Filled 2021-09-20: qty 20

## 2021-09-20 MED ORDER — LISINOPRIL-HYDROCHLOROTHIAZIDE 20-12.5 MG PO TABS: 1.0000 | ORAL_TABLET | Freq: Every day | ORAL | Status: DC

## 2021-09-20 MED ORDER — MONTELUKAST SODIUM 10 MG PO TABS
10.0000 mg | ORAL_TABLET | Freq: Every day | ORAL | Status: DC
  Administered 2021-09-20: 10 mg via ORAL
  Filled 2021-09-20: qty 1

## 2021-09-20 MED ORDER — ACETAMINOPHEN 650 MG RE SUPP: 650.0000 mg | RECTAL | Status: DC | PRN

## 2021-09-20 MED ORDER — HYDROCHLOROTHIAZIDE 12.5 MG PO TABS
12.5000 mg | ORAL_TABLET | Freq: Every day | ORAL | Status: DC
  Administered 2021-09-20 – 2021-09-21 (×2): 12.5 mg via ORAL
  Filled 2021-09-20 (×2): qty 1

## 2021-09-20 MED ORDER — ALLOPURINOL 100 MG PO TABS
300.0000 mg | ORAL_TABLET | Freq: Every day | ORAL | Status: DC
  Administered 2021-09-20 – 2021-09-21 (×2): 300 mg via ORAL
  Filled 2021-09-20 (×2): qty 3

## 2021-09-20 MED ORDER — ORAL CARE MOUTH RINSE: 15.0000 mL | Freq: Once | OROMUCOSAL | Status: AC

## 2021-09-20 MED ORDER — METHOTREXATE 2.5 MG PO TABS
15.0000 mg | ORAL_TABLET | ORAL | Status: DC
  Filled 2021-09-20: qty 6

## 2021-09-20 MED ORDER — LISINOPRIL 20 MG PO TABS
20.0000 mg | ORAL_TABLET | Freq: Every day | ORAL | Status: DC
  Administered 2021-09-20 – 2021-09-21 (×2): 20 mg via ORAL
  Filled 2021-09-20 (×2): qty 1

## 2021-09-20 MED ORDER — SODIUM CHLORIDE 0.9% FLUSH
3.0000 mL | Freq: Two times a day (BID) | INTRAVENOUS | Status: DC
  Administered 2021-09-20: 3 mL via INTRAVENOUS

## 2021-09-20 MED ORDER — MENTHOL 3 MG MT LOZG
1.0000 | LOZENGE | OROMUCOSAL | Status: DC | PRN
  Filled 2021-09-20: qty 9

## 2021-09-20 MED ORDER — CYCLOBENZAPRINE HCL 10 MG PO TABS: 10.0000 mg | ORAL_TABLET | Freq: Three times a day (TID) | ORAL | Status: DC | PRN

## 2021-09-20 MED ORDER — OXYCODONE HCL 5 MG PO TABS
10.0000 mg | ORAL_TABLET | ORAL | Status: DC | PRN
  Administered 2021-09-20: 10 mg via ORAL
  Filled 2021-09-20: qty 2

## 2021-09-20 MED ORDER — THROMBIN 5000 UNITS EX SOLR
CUTANEOUS | Status: DC | PRN
  Administered 2021-09-20: 5 mL via TOPICAL

## 2021-09-20 MED ORDER — 0.9 % SODIUM CHLORIDE (POUR BTL) OPTIME
TOPICAL | Status: DC | PRN
  Administered 2021-09-20: 1000 mL

## 2021-09-20 MED ORDER — ONDANSETRON HCL 4 MG/2ML IJ SOLN: 4.0000 mg | Freq: Four times a day (QID) | INTRAMUSCULAR | Status: DC | PRN

## 2021-09-20 MED ORDER — BACITRACIN ZINC 500 UNIT/GM EX OINT
TOPICAL_OINTMENT | CUTANEOUS | Status: DC | PRN
  Administered 2021-09-20: 1 via TOPICAL

## 2021-09-20 MED ORDER — FENTANYL CITRATE (PF) 250 MCG/5ML IJ SOLN
INTRAMUSCULAR | Status: AC
  Filled 2021-09-20: qty 5

## 2021-09-20 MED ORDER — PROPOFOL 10 MG/ML IV BOLUS
INTRAVENOUS | Status: DC | PRN
Start: 2021-09-20 — End: 2021-09-20
  Administered 2021-09-20: 170 mg via INTRAVENOUS

## 2021-09-20 MED ORDER — LIDOCAINE 2% (20 MG/ML) 5 ML SYRINGE
INTRAMUSCULAR | Status: AC
  Filled 2021-09-20: qty 5

## 2021-09-20 MED ORDER — CEFAZOLIN IN SODIUM CHLORIDE 3-0.9 GM/100ML-% IV SOLN
3.0000 g | INTRAVENOUS | Status: AC
  Administered 2021-09-20: 3 g via INTRAVENOUS
  Filled 2021-09-20: qty 100

## 2021-09-20 MED ORDER — HYDROMORPHONE HCL 1 MG/ML IJ SOLN: 1.0000 mg | INTRAMUSCULAR | Status: DC | PRN

## 2021-09-20 MED ORDER — SODIUM CHLORIDE 0.9% FLUSH: 3.0000 mL | INTRAVENOUS | Status: DC | PRN

## 2021-09-20 MED ORDER — ACETAMINOPHEN 325 MG PO TABS: 650.0000 mg | ORAL_TABLET | ORAL | Status: DC | PRN

## 2021-09-20 MED ORDER — HYDROMORPHONE HCL 1 MG/ML IJ SOLN
0.2500 mg | INTRAMUSCULAR | Status: DC | PRN
  Administered 2021-09-20: 0.5 mg via INTRAVENOUS

## 2021-09-20 MED ORDER — ROCURONIUM BROMIDE 10 MG/ML (PF) SYRINGE
PREFILLED_SYRINGE | INTRAVENOUS | Status: AC
  Filled 2021-09-20: qty 10

## 2021-09-20 MED ORDER — PHENOL 1.4 % MT LIQD: 1.0000 | OROMUCOSAL | Status: DC | PRN

## 2021-09-20 MED ORDER — HYDROMORPHONE HCL 1 MG/ML IJ SOLN
INTRAMUSCULAR | Status: AC
  Filled 2021-09-20: qty 1

## 2021-09-20 MED ORDER — CHLORHEXIDINE GLUCONATE 0.12 % MT SOLN
15.0000 mL | Freq: Once | OROMUCOSAL | Status: AC
  Administered 2021-09-20: 15 mL via OROMUCOSAL
  Filled 2021-09-20: qty 15

## 2021-09-20 MED ORDER — CEFAZOLIN SODIUM-DEXTROSE 2-4 GM/100ML-% IV SOLN
2.0000 g | Freq: Three times a day (TID) | INTRAVENOUS | Status: AC
  Administered 2021-09-20 – 2021-09-21 (×2): 2 g via INTRAVENOUS
  Filled 2021-09-20 (×2): qty 100

## 2021-09-20 MED ORDER — ONDANSETRON HCL 4 MG PO TABS: 4.0000 mg | ORAL_TABLET | Freq: Four times a day (QID) | ORAL | Status: DC | PRN

## 2021-09-20 MED ORDER — FENTANYL CITRATE (PF) 250 MCG/5ML IJ SOLN
INTRAMUSCULAR | Status: DC | PRN
  Administered 2021-09-20: 50 ug via INTRAVENOUS
  Administered 2021-09-20: 100 ug via INTRAVENOUS

## 2021-09-20 MED ORDER — POLYETHYLENE GLYCOL 3350 17 G PO PACK: 17.0000 g | PACK | Freq: Every day | ORAL | Status: DC | PRN

## 2021-09-20 MED ORDER — LIDOCAINE 2% (20 MG/ML) 5 ML SYRINGE
INTRAMUSCULAR | Status: DC | PRN
Start: 2021-09-20 — End: 2021-09-20
  Administered 2021-09-20: 80 mg via INTRAVENOUS

## 2021-09-20 MED ORDER — SUCCINYLCHOLINE CHLORIDE 200 MG/10ML IV SOSY
PREFILLED_SYRINGE | INTRAVENOUS | Status: AC
  Filled 2021-09-20: qty 10

## 2021-09-20 MED ORDER — DEXAMETHASONE SODIUM PHOSPHATE 10 MG/ML IJ SOLN
INTRAMUSCULAR | Status: AC
  Filled 2021-09-20: qty 1

## 2021-09-20 MED ORDER — SODIUM CHLORIDE 0.9 % IV SOLN
250.0000 mL | INTRAVENOUS | Status: DC
  Administered 2021-09-20: 250 mL via INTRAVENOUS

## 2021-09-20 MED ORDER — ONDANSETRON HCL 4 MG/2ML IJ SOLN
INTRAMUSCULAR | Status: DC | PRN
  Administered 2021-09-20: 4 mg via INTRAVENOUS

## 2021-09-20 MED ORDER — ROCURONIUM BROMIDE 10 MG/ML (PF) SYRINGE
PREFILLED_SYRINGE | INTRAVENOUS | Status: DC | PRN
Start: 2021-09-20 — End: 2021-09-20
  Administered 2021-09-20: 20 mg via INTRAVENOUS
  Administered 2021-09-20: 60 mg via INTRAVENOUS
  Administered 2021-09-20: 20 mg via INTRAVENOUS

## 2021-09-20 SURGICAL SUPPLY — 55 items
BAG COUNTER SPONGE SURGICOUNT (BAG) ×2 IMPLANT
BENZOIN TINCTURE PRP APPL 2/3 (GAUZE/BANDAGES/DRESSINGS) IMPLANT
BLADE CLIPPER SURG (BLADE) ×2 IMPLANT
BUR MATCHSTICK NEURO 3.0 LAGG (BURR) IMPLANT
BUR PRECISION FLUTE 5.0 (BURR) ×2 IMPLANT
CANISTER SUCT 3000ML PPV (MISCELLANEOUS) ×2 IMPLANT
DECANTER SPIKE VIAL GLASS SM (MISCELLANEOUS) ×2 IMPLANT
DERMABOND ADVANCED (GAUZE/BANDAGES/DRESSINGS) ×1
DERMABOND ADVANCED .7 DNX12 (GAUZE/BANDAGES/DRESSINGS) ×1 IMPLANT
DRAPE C-ARM 42X72 X-RAY (DRAPES) ×4 IMPLANT
DRAPE LAPAROTOMY 100X72 PEDS (DRAPES) ×2 IMPLANT
DURAPREP 6ML APPLICATOR 50/CS (WOUND CARE) ×2 IMPLANT
ELECT BLADE 4.0 EZ CLEAN MEGAD (MISCELLANEOUS) ×2
ELECT REM PT RETURN 9FT ADLT (ELECTROSURGICAL) ×2
ELECTRODE BLDE 4.0 EZ CLN MEGD (MISCELLANEOUS) IMPLANT
ELECTRODE REM PT RTRN 9FT ADLT (ELECTROSURGICAL) ×1 IMPLANT
GAUZE 4X4 16PLY ~~LOC~~+RFID DBL (SPONGE) ×1 IMPLANT
GAUZE SPONGE 4X4 12PLY STRL (GAUZE/BANDAGES/DRESSINGS) IMPLANT
GLOVE EXAM NITRILE LRG STRL (GLOVE) IMPLANT
GLOVE EXAM NITRILE XL STR (GLOVE) IMPLANT
GLOVE EXAM NITRILE XS STR PU (GLOVE) IMPLANT
GLOVE SURG LTX SZ7.5 (GLOVE) ×4 IMPLANT
GLOVE SURG POLYISO LF SZ7 (GLOVE) ×4 IMPLANT
GLOVE SURG UNDER POLY LF SZ7 (GLOVE) ×4 IMPLANT
GLOVE SURG UNDER POLY LF SZ7.5 (GLOVE) ×5 IMPLANT
GOWN STRL REUS W/ TWL LRG LVL3 (GOWN DISPOSABLE) IMPLANT
GOWN STRL REUS W/ TWL XL LVL3 (GOWN DISPOSABLE) ×1 IMPLANT
GOWN STRL REUS W/TWL 2XL LVL3 (GOWN DISPOSABLE) IMPLANT
GOWN STRL REUS W/TWL LRG LVL3 (GOWN DISPOSABLE) ×4
GOWN STRL REUS W/TWL XL LVL3 (GOWN DISPOSABLE) ×1
HEMOSTAT POWDER KIT SURGIFOAM (HEMOSTASIS) ×2 IMPLANT
KIT BASIN OR (CUSTOM PROCEDURE TRAY) ×2 IMPLANT
KIT TURNOVER KIT B (KITS) ×2 IMPLANT
NEEDLE HYPO 22GX1.5 SAFETY (NEEDLE) ×2 IMPLANT
NS IRRIG 1000ML POUR BTL (IV SOLUTION) ×2 IMPLANT
PACK LAMINECTOMY NEURO (CUSTOM PROCEDURE TRAY) ×2 IMPLANT
PAD ARMBOARD 7.5X6 YLW CONV (MISCELLANEOUS) ×6 IMPLANT
PIN MAYFIELD SKULL DISP (PIN) ×2 IMPLANT
PUTTY GRAFTON DBF 6CC W/DELIVE (Putty) ×1 IMPLANT
ROD PRECUT 3.5X60 (Rod) ×2 IMPLANT
SCREW MULTI AXIAL 3.5X16MM (Screw) ×6 IMPLANT
SET SCREW INFINITY IFIX THOR (Screw) ×6 IMPLANT
SPONGE T-LAP 4X18 ~~LOC~~+RFID (SPONGE) ×2 IMPLANT
STAPLER VISISTAT 35W (STAPLE) ×2 IMPLANT
SUT ETHILON 3 0 FSL (SUTURE) IMPLANT
SUT MNCRL AB 3-0 PS2 18 (SUTURE) ×2 IMPLANT
SUT MON AB 3-0 SH 27 (SUTURE)
SUT MON AB 3-0 SH27 (SUTURE) ×1 IMPLANT
SUT VIC AB 0 CT1 18XCR BRD8 (SUTURE) ×1 IMPLANT
SUT VIC AB 0 CT1 8-18 (SUTURE) ×1
SUT VIC AB 2-0 CP2 18 (SUTURE) ×2 IMPLANT
TOWEL GREEN STERILE (TOWEL DISPOSABLE) ×2 IMPLANT
TOWEL GREEN STERILE FF (TOWEL DISPOSABLE) ×2 IMPLANT
UNDERPAD 30X36 HEAVY ABSORB (UNDERPADS AND DIAPERS) ×2 IMPLANT
WATER STERILE IRR 1000ML POUR (IV SOLUTION) ×2 IMPLANT

## 2021-09-20 NOTE — Anesthesia Procedure Notes (Signed)
Procedure Name: Intubation Date/Time: 09/20/2021 11:56 AM Performed by: Gaylene Brooks, CRNA Pre-anesthesia Checklist: Patient identified, Emergency Drugs available, Suction available and Patient being monitored Patient Re-evaluated:Patient Re-evaluated prior to induction Oxygen Delivery Method: Circle System Utilized Preoxygenation: Pre-oxygenation with 100% oxygen Induction Type: IV induction Laryngoscope Size: Glidescope and 4 Grade View: Grade II Tube type: Oral Number of attempts: 1 Airway Equipment and Method: Stylet Placement Confirmation: ETT inserted through vocal cords under direct vision, positive ETCO2 and breath sounds checked- equal and bilateral Secured at: 23 cm Tube secured with: Tape Dental Injury: Teeth and Oropharynx as per pre-operative assessment  Comments: Glidescope due to previous difficulty with intubation. Smooth induction with succ. Grade 2 view. ETT passed easily.

## 2021-09-20 NOTE — Transfer of Care (Signed)
Immediate Anesthesia Transfer of Care Note  Patient: Bill [name]  Procedure(s) Performed: Cervical five to Cervical seven Posterior instrumented fusion (Spine Cervical)  Patient Location: PACU  Anesthesia Type:General  Level of Consciousness: awake, alert  and oriented  Airway & Oxygen Therapy: Patient Spontanous Breathing and Patient connected to face mask oxygen  Post-op Assessment: Report given to RN, Post -op Vital signs reviewed and stable and Patient moving all extremities X 4  Post vital signs: Reviewed and stable  Last Vitals:  Vitals Value Taken Time  BP    Temp    Pulse 79 09/20/21 1409  Resp    SpO2 100 % 09/20/21 1409  Vitals shown include unvalidated device data.  Last Pain:  Vitals:   09/20/21 1059  TempSrc:   PainSc: 4       Patients Stated Pain Goal: 2 (09/20/21 1059)  Complications: No notable events documented.

## 2021-09-20 NOTE — Anesthesia Postprocedure Evaluation (Signed)
Anesthesia Post Note  Patient: Bill [name]  Procedure(s) Performed: Cervical five to Cervical seven Posterior instrumented fusion (Spine Cervical)     Patient location during evaluation: PACU Anesthesia Type: General Level of consciousness: awake Pain management: pain level controlled Vital Signs Assessment: post-procedure vital signs reviewed and stable Respiratory status: spontaneous breathing Cardiovascular status: stable Postop Assessment: no apparent nausea or vomiting Anesthetic complications: no   No notable events documented.  Last Vitals:  Vitals:   09/20/21 1440 09/20/21 1455  BP: 123/68 (!) 128/54  Pulse: 66 74  Resp: 17 14  Temp:    SpO2: 94% 97%    Last Pain:  Vitals:   09/20/21 1455  TempSrc:   PainSc: 2                  [name]

## 2021-09-20 NOTE — H&P (Signed)
Surgical H&P Update  HPI: 66 y.o. man with a h/o prior ACDF that developed a pseudoarthrosis with severe neck pain. No new radicular symptoms or other new medical issues since I last saw him, still having severe neck pain and wishes to proceed with surgery.   PMHx:  Past Medical History:  Diagnosis Date   ADHD (attention deficit hyperactivity disorder)    Anemia    Ankylosing spondylitis (HCC)    Chronic kidney disease    Coronary artery disease    Depression    Gout    Hearing loss    Hyperlipidemia    Hypertension    Morbid obesity with BMI of 40.0-44.9, adult (HCC)    Myocardial infarction (HCC)    OSA treated with BiPAP    Restrictive lung disease    Spondylarthritis    FamHx: History reviewed. No pertinent family history. SocHx:  reports that he has never smoked. He has never used smokeless tobacco. He reports that he does not currently use alcohol. He reports that he does not use drugs.  Physical Exam: Strength 5/5 x4, SILTx4, no hoffman's  Assesment/Plan: 66 y.o. 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 Risks, benefits, and alternatives discussed and the patient would like to continue with surgery.  -OR today -3C post-op  Jadene Pierini, MD 09/20/21 11:39 AM

## 2021-09-20 NOTE — Op Note (Signed)
PATIENT: Bill [name]  DAY OF SURGERY: 09/20/21   PRE-OPERATIVE DIAGNOSIS:  Cervical pseudoarthrosis   POST-OPERATIVE DIAGNOSIS:  Same   PROCEDURE:  C5-C7 posterior spinal instrumented fusion   SURGEON:  Surgeon(s) and Role:    Jadene Pierini, MD - Primary    Lisbeth Renshaw, MD - Assisting   ANESTHESIA: ETGA   BRIEF HISTORY: This is a 66 year old man in whom I previously treated adjacent segment disease with an ACDF. He unfortunately developed severe neck pain and a CT showed a pseudoarthrosis. His symptoms were refractory to non-surgical treatment measures, I therefore recommended a posterior cervical instrumented fusion to treat his pseudoarthrosis. This was discussed with the patient as well as risks, benefits, and alternatives and wished to proceed with surgery.   OPERATIVE DETAIL: The patient was taken to the operating room, anesthesia was induced by the anesthesia team, the [place] head holder was applied and the patient was carefully positioned prone onto the OR table. The [place] was secured to the table after pressure points were padded.   A formal time out was performed with two patient identifiers and confirmed the operative site. The operative site was marked, hair was clipped with surgical clippers, the area was then prepped and draped in a sterile fashion. A midline posterior cervical incision was created, subperiosteal dissection was performed bilaterally to expose the lateral masses of C5, C6, and C7. As expected from preop imaging / exam, there was significant adipose tissue that required use of the versatrac retractor system with use of 9cm blades.   Fluoroscopy was used for localization. This was, again, difficult as expected. I was thankfully able to localize using the prior anterior hardware. Despite significant taping of soft tissues and the shoulders preop, this still required a very oblique view with coning down, but was able to confirm a clamp on the C5  spinous process. I removed a small piece of the spinous process to mark it and kept these bone fragments for the fusion as autograft.   Lateral mass screws were then placed bilaterally using standard landmarks by drilling a small cortical starting point, using a CD4 drill with a drill guide, palpating for breaches, and placing self tapping screws. 72mm [employer] screws were used at all levels. The facets of C5-6 and C6-7 were then decorticated and demineralized bone matrix with autograft were placed bilaterally for the fusion along the lateral masses as well as into the facet joints, which were also decorticated. Rods were then placed bilaterally through the screw heads on both sides, which was also used to secure the graft in place. Caps were placed and final tightened, the wound was copiously irrigated, hemostasis was confirmed, all instrument and sponge counts were correct, the incision was then closed in layers.   The patient was then returned to anesthesia for emergence. No apparent complications at the completion of the procedure.   EBL:    DRAINS: none   SPECIMENS: none   Jadene Pierini, MD 09/20/21 11:41 AM

## 2021-09-21 ENCOUNTER — Encounter (HOSPITAL_COMMUNITY): Payer: Self-pay | Admitting: Neurological Surgery

## 2021-09-21 MED ORDER — OXYCODONE HCL 5 MG PO TABS: 5.0000 mg | ORAL_TABLET | ORAL | 0 refills | Status: DC | PRN

## 2021-09-21 NOTE — TOC Transition Note (Signed)
Transition of Care [facility]) - CM/SW Discharge Note   Patient Details  Name: Bill [name] MRN: [id number] Date of Birth: 1955-11-01  Transition of Care [facility]) CM/SW Contact:  Kermit Balo, RN Phone Number: 09/21/2021, 10:53 AM   Clinical Narrative:    Patient discharged with self care. No needs per TOC.    Final next level of care: Home/Self Care Barriers to Discharge: No Barriers Identified   Patient Goals and CMS Choice        Discharge Placement                       Discharge Plan and Services                                     Social Determinants of Health (SDOH) Interventions     Readmission Risk Interventions No flowsheet data found.

## 2021-09-21 NOTE — Progress Notes (Signed)
Neurosurgery Service Progress Note  Subjective: No acute events overnight, had some oozing from his surgical site otherwise no complaints   Objective: Vitals:   09/20/21 1931 09/20/21 2333 09/21/21 0254 09/21/21 0413  BP: 138/72 124/62  137/68  Pulse: 90 91  94  Resp: 17 16  20   Temp: 98.3 F (36.8 C) 98.6 F (37 C) 98.2 F (36.8 C) 97.7 F (36.5 C)  TempSrc: Oral Oral Oral Oral  SpO2: 97% 95%  98%  Weight:      Height:        Physical Exam: Strength 5/5 x4, SILTx4, no hoffman's, incision c/d/i  Assessment & Plan: 66 y.o. man s/p PSIF for pseudoarthrosis, recovering well.  -discharge home today  Judith Part  09/21/21 8:34 AM

## 2021-09-21 NOTE — Discharge Summary (Signed)
Discharge Summary  Date of Admission: 09/20/2021  Date of Discharge: 09/21/21  Attending Physician: Autumn Patty, MD  Hospital Course: Patient was admitted following an uncomplicated posterior cervical instrumented fusion for pseudoarthrosis. He was recovered in PACU and transferred to 3W. His hospital course was uncomplicated and the patient was discharged home on 09/21/21. he will follow up in clinic with me in 2 weeks.  Neurologic exam at discharge:  Strength 5/5 x4, SILTx4, no drift  Discharge diagnosis: Cervical pseudoarthrosis  Jadene Pierini, MD 09/21/21 8:36 AM

## 2021-09-21 NOTE — Progress Notes (Signed)
Pt noted to have old blood drainage along with some fresh blood on his pillow case. Pt sitting at side of bed, then which was noted blood streaming down his mid back. Blood was not in abundance. Small opening noted at bottom of surgical site. Mepilex applied with slight pressure, which has since controlled drainage. Dr. Patric Dykes informed and updated. Pt did complain of feeling hot and sweaty. Pt's temp taken wnl.

## 2021-09-21 NOTE — Plan of Care (Signed)
°  Problem: Education: Goal: Ability to verbalize activity precautions or restrictions will improve Outcome: Progressing Goal: Knowledge of the prescribed therapeutic regimen will improve Outcome: Progressing   Problem: Activity: Goal: Ability to avoid complications of mobility impairment will improve Outcome: Progressing Goal: Ability to tolerate increased activity will improve Outcome: Progressing   Problem: Bowel/Gastric: Goal: Gastrointestinal status for postoperative course will improve Outcome: Progressing   Problem: Clinical Measurements: Goal: Ability to maintain clinical measurements within normal limits will improve Outcome: Progressing

## 2021-10-15 ENCOUNTER — Encounter (HOSPITAL_COMMUNITY): Payer: Self-pay

## 2021-10-15 ENCOUNTER — Inpatient Hospital Stay (HOSPITAL_COMMUNITY)
Admission: EM | Admit: 2021-10-15 | Discharge: 2021-10-20 | DRG: 856 | Disposition: A | Discharge status: Home Health | Payer: No Typology Code available for payment source | Attending: Internal Medicine | Admitting: Internal Medicine

## 2021-10-15 ENCOUNTER — Other Ambulatory Visit: Payer: Self-pay

## 2021-10-15 ENCOUNTER — Encounter (HOSPITAL_COMMUNITY)
Admission: EM | Disposition: A | Discharge status: Home Health | Payer: Self-pay | Source: Home / Self Care | Attending: Internal Medicine

## 2021-10-15 ENCOUNTER — Observation Stay (HOSPITAL_COMMUNITY): Payer: No Typology Code available for payment source | Admitting: Certified Registered Nurse Anesthetist

## 2021-10-15 ENCOUNTER — Observation Stay (HOSPITAL_COMMUNITY): Payer: No Typology Code available for payment source

## 2021-10-15 DIAGNOSIS — M79631 Pain in right forearm: Secondary | ICD-10-CM | POA: Diagnosis not present

## 2021-10-15 DIAGNOSIS — M459 Ankylosing spondylitis of unspecified sites in spine: Secondary | ICD-10-CM

## 2021-10-15 DIAGNOSIS — R7881 Bacteremia: Secondary | ICD-10-CM | POA: Diagnosis present

## 2021-10-15 DIAGNOSIS — M47812 Spondylosis without myelopathy or radiculopathy, cervical region: Secondary | ICD-10-CM | POA: Diagnosis present

## 2021-10-15 DIAGNOSIS — I252 Old myocardial infarction: Secondary | ICD-10-CM

## 2021-10-15 DIAGNOSIS — N189 Chronic kidney disease, unspecified: Secondary | ICD-10-CM

## 2021-10-15 DIAGNOSIS — D649 Anemia, unspecified: Secondary | ICD-10-CM | POA: Diagnosis present

## 2021-10-15 DIAGNOSIS — Z8249 Family history of ischemic heart disease and other diseases of the circulatory system: Secondary | ICD-10-CM

## 2021-10-15 DIAGNOSIS — Z888 Allergy status to other drugs, medicaments and biological substances status: Secondary | ICD-10-CM

## 2021-10-15 DIAGNOSIS — I2510 Atherosclerotic heart disease of native coronary artery without angina pectoris: Secondary | ICD-10-CM

## 2021-10-15 DIAGNOSIS — H9190 Unspecified hearing loss, unspecified ear: Secondary | ICD-10-CM | POA: Diagnosis present

## 2021-10-15 DIAGNOSIS — I38 Endocarditis, valve unspecified: Secondary | ICD-10-CM | POA: Diagnosis present

## 2021-10-15 DIAGNOSIS — T8149XA Infection following a procedure, other surgical site, initial encounter: Secondary | ICD-10-CM | POA: Diagnosis not present

## 2021-10-15 DIAGNOSIS — F909 Attention-deficit hyperactivity disorder, unspecified type: Secondary | ICD-10-CM | POA: Diagnosis present

## 2021-10-15 DIAGNOSIS — K760 Fatty (change of) liver, not elsewhere classified: Secondary | ICD-10-CM | POA: Diagnosis present

## 2021-10-15 DIAGNOSIS — Z20822 Contact with and (suspected) exposure to covid-19: Secondary | ICD-10-CM | POA: Diagnosis present

## 2021-10-15 DIAGNOSIS — T8141XA Infection following a procedure, superficial incisional surgical site, initial encounter: Principal | ICD-10-CM | POA: Diagnosis present

## 2021-10-15 DIAGNOSIS — M4620 Osteomyelitis of vertebra, site unspecified: Secondary | ICD-10-CM

## 2021-10-15 DIAGNOSIS — R7303 Prediabetes: Secondary | ICD-10-CM | POA: Diagnosis present

## 2021-10-15 DIAGNOSIS — F32A Depression, unspecified: Secondary | ICD-10-CM | POA: Diagnosis present

## 2021-10-15 DIAGNOSIS — I129 Hypertensive chronic kidney disease with stage 1 through stage 4 chronic kidney disease, or unspecified chronic kidney disease: Secondary | ICD-10-CM | POA: Diagnosis present

## 2021-10-15 DIAGNOSIS — N179 Acute kidney failure, unspecified: Secondary | ICD-10-CM

## 2021-10-15 DIAGNOSIS — I330 Acute and subacute infective endocarditis: Secondary | ICD-10-CM | POA: Diagnosis present

## 2021-10-15 DIAGNOSIS — G4733 Obstructive sleep apnea (adult) (pediatric): Secondary | ICD-10-CM

## 2021-10-15 DIAGNOSIS — B9561 Methicillin susceptible Staphylococcus aureus infection as the cause of diseases classified elsewhere: Secondary | ICD-10-CM

## 2021-10-15 DIAGNOSIS — I10 Essential (primary) hypertension: Secondary | ICD-10-CM

## 2021-10-15 DIAGNOSIS — I808 Phlebitis and thrombophlebitis of other sites: Secondary | ICD-10-CM | POA: Diagnosis present

## 2021-10-15 DIAGNOSIS — N1831 Chronic kidney disease, stage 3a: Secondary | ICD-10-CM | POA: Diagnosis present

## 2021-10-15 DIAGNOSIS — D84821 Immunodeficiency due to drugs: Secondary | ICD-10-CM

## 2021-10-15 DIAGNOSIS — E785 Hyperlipidemia, unspecified: Secondary | ICD-10-CM

## 2021-10-15 DIAGNOSIS — T368X5A Adverse effect of other systemic antibiotics, initial encounter: Secondary | ICD-10-CM | POA: Diagnosis not present

## 2021-10-15 DIAGNOSIS — I96 Gangrene, not elsewhere classified: Secondary | ICD-10-CM | POA: Diagnosis present

## 2021-10-15 DIAGNOSIS — R140 Abdominal distension (gaseous): Secondary | ICD-10-CM | POA: Diagnosis not present

## 2021-10-15 DIAGNOSIS — Z79899 Other long term (current) drug therapy: Secondary | ICD-10-CM

## 2021-10-15 DIAGNOSIS — M4802 Spinal stenosis, cervical region: Secondary | ICD-10-CM | POA: Diagnosis present

## 2021-10-15 DIAGNOSIS — T8132XA Disruption of internal operation (surgical) wound, not elsewhere classified, initial encounter: Secondary | ICD-10-CM | POA: Diagnosis present

## 2021-10-15 DIAGNOSIS — M109 Gout, unspecified: Secondary | ICD-10-CM | POA: Diagnosis present

## 2021-10-15 DIAGNOSIS — Z6841 Body Mass Index (BMI) 40.0 and over, adult: Secondary | ICD-10-CM

## 2021-10-15 DIAGNOSIS — G4730 Sleep apnea, unspecified: Secondary | ICD-10-CM | POA: Diagnosis present

## 2021-10-15 DIAGNOSIS — Z981 Arthrodesis status: Secondary | ICD-10-CM

## 2021-10-15 HISTORY — PX: POSTERIOR CERVICAL FUSION/FORAMINOTOMY: SHX5038

## 2021-10-15 LAB — CBC WITH DIFFERENTIAL/PLATELET
Abs Immature Granulocytes: 0.1 10*3/uL — ABNORMAL HIGH (ref 0.00–0.07)
Basophils Absolute: 0.1 10*3/uL (ref 0.0–0.1)
Basophils Relative: 1 %
Eosinophils Absolute: 0.1 10*3/uL (ref 0.0–0.5)
Eosinophils Relative: 1 %
HCT: 35.3 % — ABNORMAL LOW (ref 39.0–52.0)
Hemoglobin: 11.1 g/dL — ABNORMAL LOW (ref 13.0–17.0)
Immature Granulocytes: 1 %
Lymphocytes Relative: 6 %
Lymphs Abs: 0.5 10*3/uL — ABNORMAL LOW (ref 0.7–4.0)
MCH: 30 pg (ref 26.0–34.0)
MCHC: 31.4 g/dL (ref 30.0–36.0)
MCV: 95.4 fL (ref 80.0–100.0)
Monocytes Absolute: 0.7 10*3/uL (ref 0.1–1.0)
Monocytes Relative: 8 %
Neutro Abs: 7 10*3/uL (ref 1.7–7.7)
Neutrophils Relative %: 83 %
Platelets: 243 10*3/uL (ref 150–400)
RBC: 3.7 MIL/uL — ABNORMAL LOW (ref 4.22–5.81)
RDW: 14.2 % (ref 11.5–15.5)
WBC: 8.4 10*3/uL (ref 4.0–10.5)
nRBC: 0 % (ref 0.0–0.2)

## 2021-10-15 LAB — COMPREHENSIVE METABOLIC PANEL
ALT: 83 U/L — ABNORMAL HIGH (ref 0–44)
AST: 94 U/L — ABNORMAL HIGH (ref 15–41)
Albumin: 2.8 g/dL — ABNORMAL LOW (ref 3.5–5.0)
Alkaline Phosphatase: 118 U/L (ref 38–126)
Anion gap: 12 (ref 5–15)
BUN: 45 mg/dL — ABNORMAL HIGH (ref 8–23)
CO2: 22 mmol/L (ref 22–32)
Calcium: 8.4 mg/dL — ABNORMAL LOW (ref 8.9–10.3)
Chloride: 99 mmol/L (ref 98–111)
Creatinine, Ser: 2.68 mg/dL — ABNORMAL HIGH (ref 0.61–1.24)
GFR, Estimated: 26 mL/min — ABNORMAL LOW (ref >60)
Glucose, Bld: 111 mg/dL — ABNORMAL HIGH (ref 70–99)
Potassium: 3.6 mmol/L (ref 3.5–5.1)
Sodium: 133 mmol/L — ABNORMAL LOW (ref 135–145)
Total Bilirubin: 2.7 mg/dL — ABNORMAL HIGH (ref 0.3–1.2)
Total Protein: 7 g/dL (ref 6.5–8.1)

## 2021-10-15 LAB — APTT: aPTT: 34 seconds (ref 24–36)

## 2021-10-15 LAB — URINALYSIS, ROUTINE W REFLEX MICROSCOPIC
Bacteria, UA: NONE SEEN
Bilirubin Urine: NEGATIVE
Glucose, UA: NEGATIVE mg/dL
Ketones, ur: NEGATIVE mg/dL
Leukocytes,Ua: NEGATIVE
Nitrite: NEGATIVE
Protein, ur: 30 mg/dL — AB
Specific Gravity, Urine: 1.021 (ref 1.005–1.030)
pH: 5 (ref 5.0–8.0)

## 2021-10-15 LAB — RESP PANEL BY RT-PCR (FLU A&B, COVID) ARPGX2
Influenza A by PCR: NEGATIVE
Influenza B by PCR: NEGATIVE
SARS Coronavirus 2 by RT PCR: NEGATIVE

## 2021-10-15 LAB — LACTIC ACID, PLASMA
Lactic Acid, Venous: 2.3 mmol/L (ref 0.5–1.9)
Lactic Acid, Venous: 1.4 mmol/L (ref 0.5–1.9)

## 2021-10-15 LAB — PROTIME-INR
INR: 1.4 — ABNORMAL HIGH (ref 0.8–1.2)
Prothrombin Time: 17.4 seconds — ABNORMAL HIGH (ref 11.4–15.2)

## 2021-10-15 LAB — TYPE AND SCREEN
ABO/RH(D): O POS
Antibody Screen: NEGATIVE

## 2021-10-15 LAB — VANCOMYCIN, RANDOM: Vancomycin Rm: 13

## 2021-10-15 IMAGING — MR MR CERVICAL SPINE W/O CM
5 series · 28 of 48 positions shown · non-contrast
Comparison: CT [DATE]

CLINICAL DATA: Increased drainage at surgical site a posterior
neck. Fluid collection seen on outside CT. Fever, leukocytosis.
Elevated serum inflammatory markers.

EXAM:
MRI CERVICAL SPINE WITHOUT CONTRAST
TECHNIQUE: Multiplanar, multisequence MR imaging of the cervical spine was
performed. No intravenous contrast was administered.

[Series 9: t2_tse_warp_sag · sagittal · 3.0mm · 0.45mm/px · 6 of 15 slices shown]
[im 1/15]
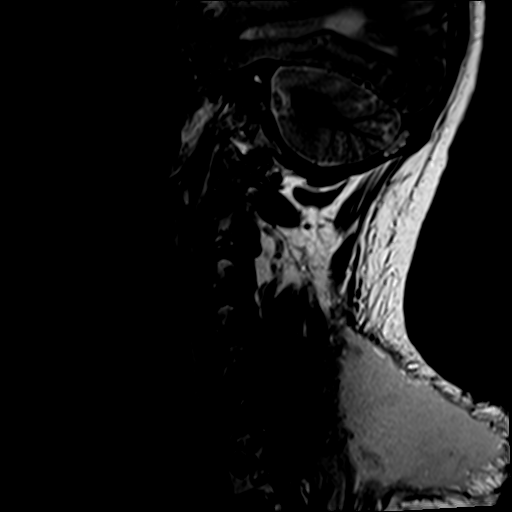
[im 3/15]
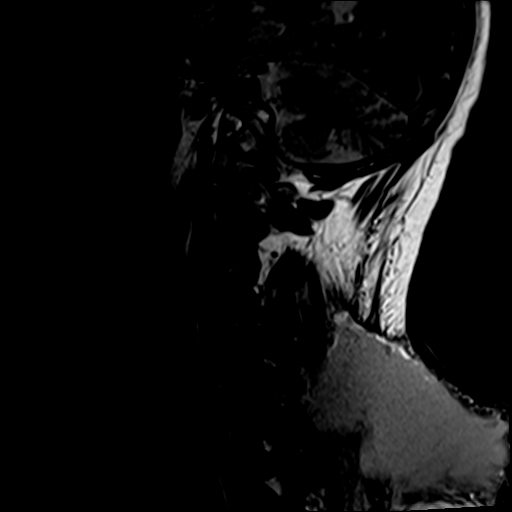
[im 6/15]
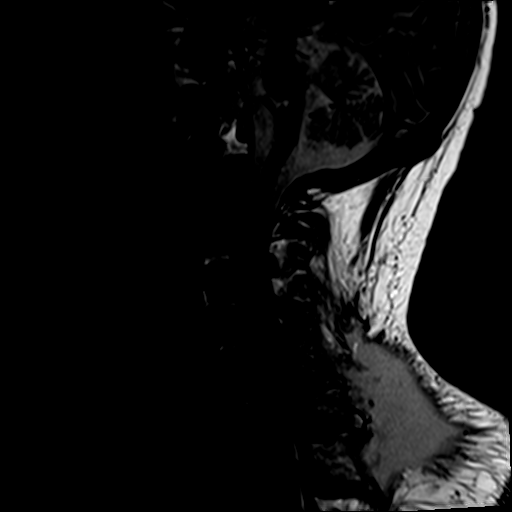
[im 9/15]
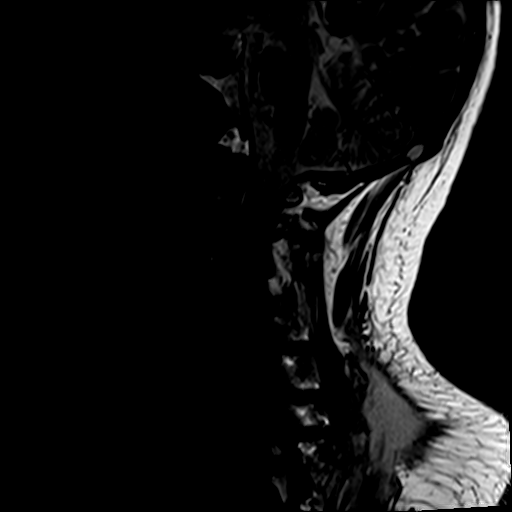
[im 12/15]
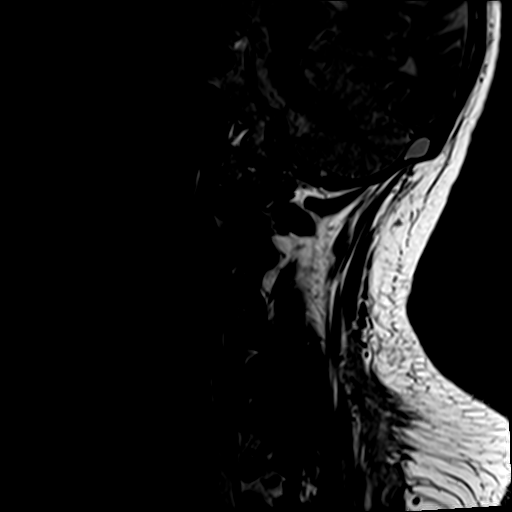
[im 15/15]
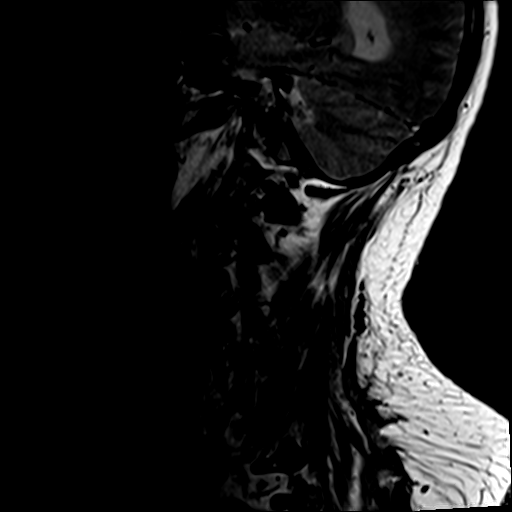

[Series 10: t1_tse_warp_sag · sagittal · 3.0mm · 0.45mm/px · 5 of 15 slices shown]
[im 1/15]
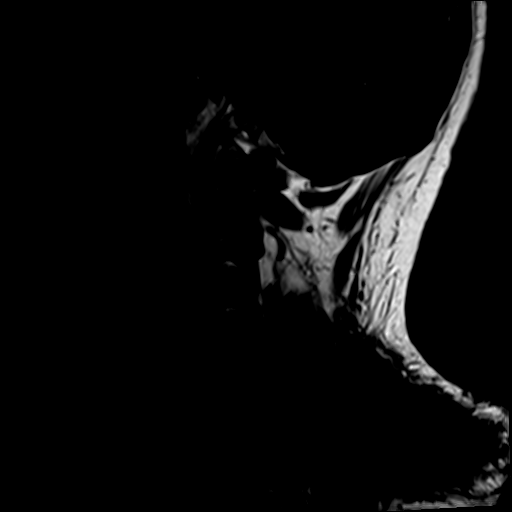
[im 4/15]
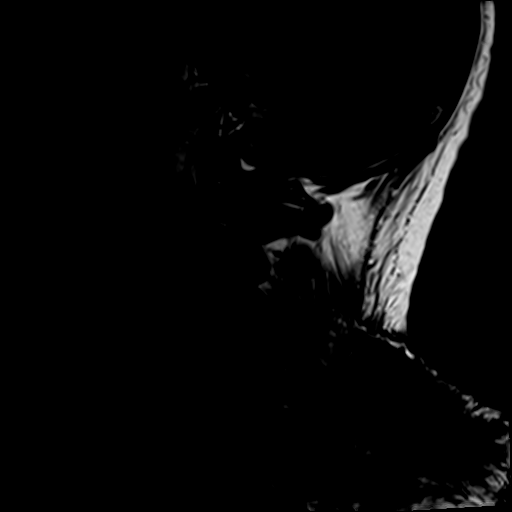
[im 8/15]
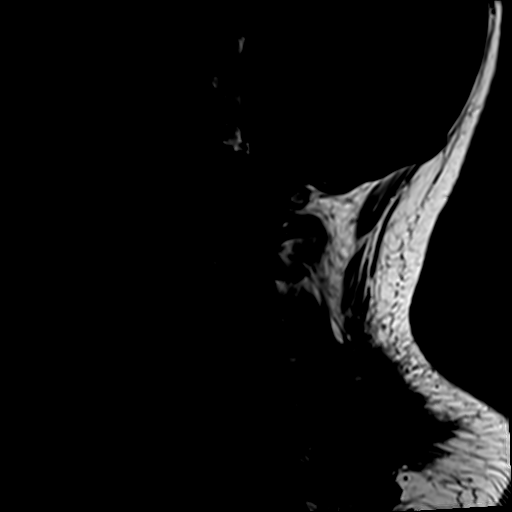
[im 11/15]
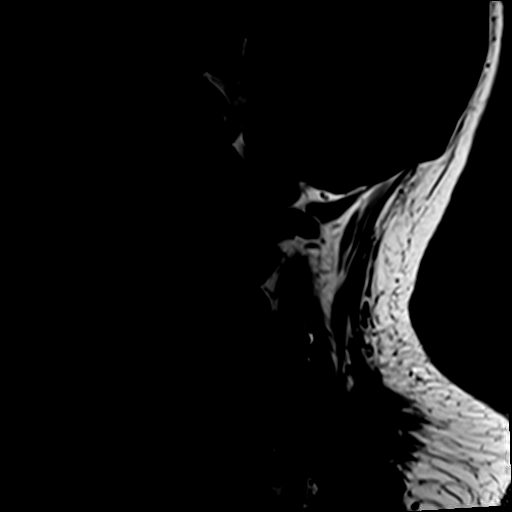
[im 15/15]
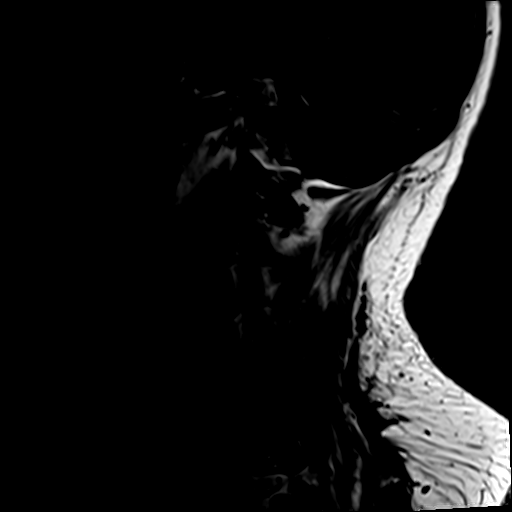

[Series 11: t2_tse_stir_warp_sag · sagittal · 3.0mm · 0.90mm/px · 5 of 15 slices shown]
[im 1/15]
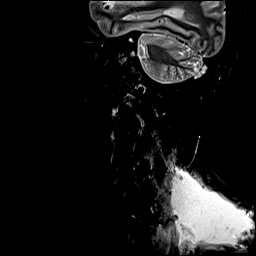
[im 4/15]
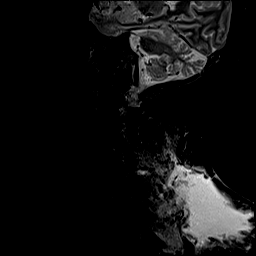
[im 8/15]
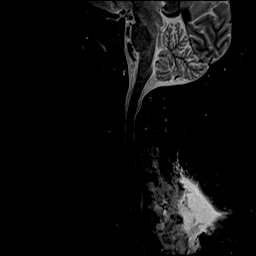
[im 11/15]
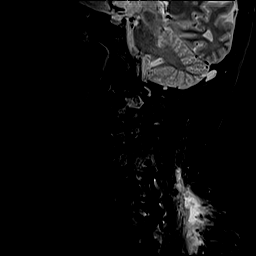
[im 15/15]
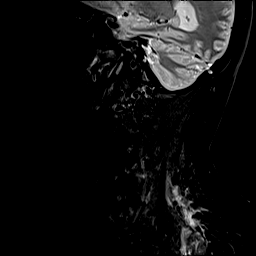

[Series 12: t2_tse_warp_tra · axial · 3.0mm · 0.78mm/px · z∈[-163,-32]mm · 10 of 46 slices shown]
[im 4/46]
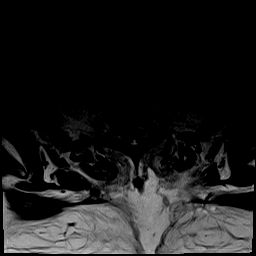
[im 7/46]
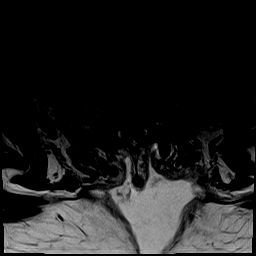
[im 10/46]
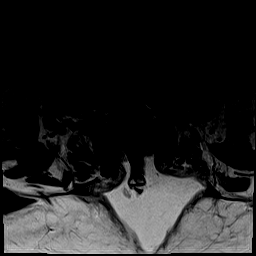
[im 16/46]
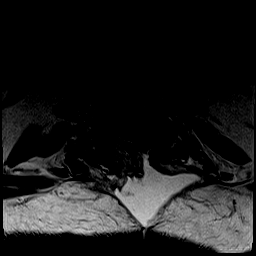
[im 22/46]
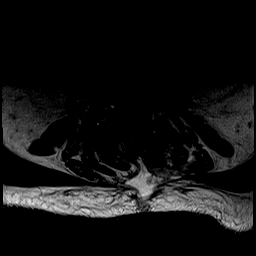
[im 25/46]
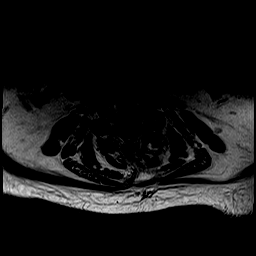
[im 28/46]
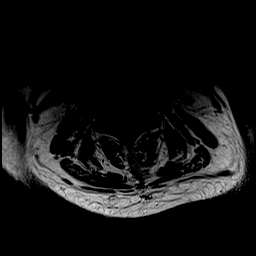
[im 34/46]
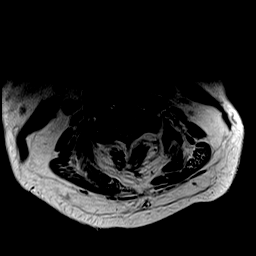
[im 40/46]
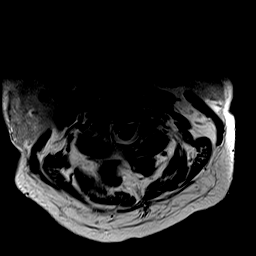
[im 46/46]
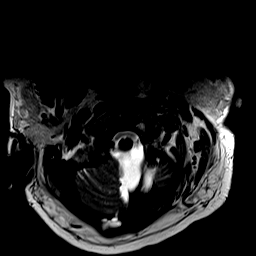

[Series 13: GRE · axial · 3.0mm · 0.39mm/px · z∈[-163,-144]mm · 2 of 46 slices shown]
[im 4/46]
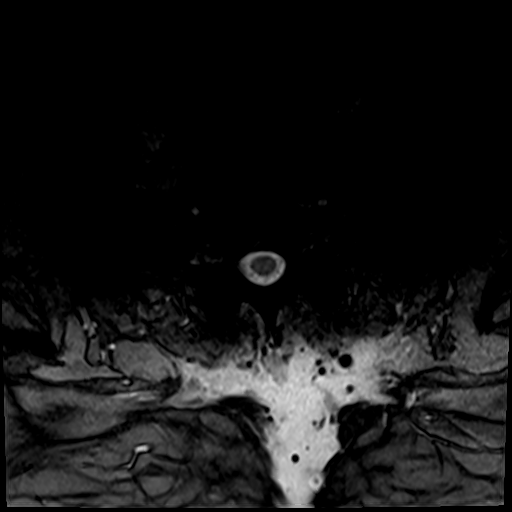
[im 10/46]
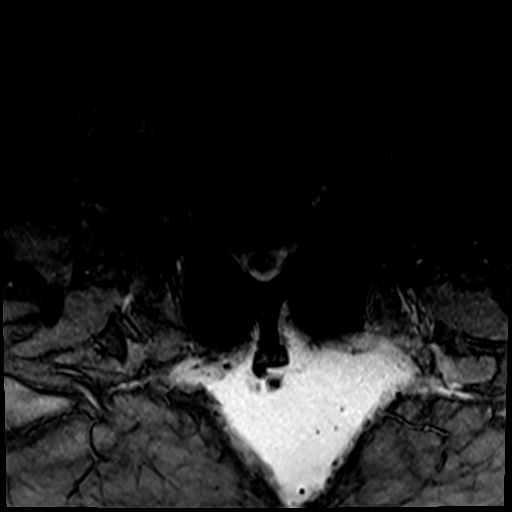

[28 of 48 positions shown; findings below may reference images not displayed]

FINDINGS: Alignment: Straightening of cervical lordosis. Minimal
anterolisthesis at C2-3.

Vertebrae: Postsurgical changes from C3-C7 ACDF with associated
metallic susceptibility artifact. Also changes of recent posterior
spinal fusion at C5-C7. No acute fracture. No evidence of discitis.
No marrow replacing bone lesion identified.

Cord: No focal cord signal abnormality is identified. No appreciable
epidural fluid collection.

Posterior Fossa, vertebral arteries, paraspinal tissues: Large fluid
collection at the operative bed of the posterior lower cervical
spine extending to the overlying skin surface along the surgical
incision site. Collection measures approximately 9.0 x 8.0 x 8.0 cm.
Multiple foci of susceptibility artifact within the collection
likely a represent a combination of micrometallic postsurgical
change and air. Collection does not extend into the lateral or
anterior paraspinal soft tissues.

Disc levels:

C2-C3: Disc osteophyte complex with right worse than left facet
arthropathy and uncovertebral spurring resulting in moderate-severe
right foraminal stenosis and mild canal stenosis.

C3-C4: Prior fusion. Mild canal stenosis predominantly related to
intrinsic canal narrowing. No foraminal stenosis.

C4-C5: Prior fusion. Mild canal stenosis predominantly related to
intrinsic canal narrowing. No foraminal stenosis.

C5-C6: Prior fusion. Mild canal stenosis predominantly related to
intrinsic canal narrowing. Suspect mild bilateral foraminal
stenosis, right slightly worse than left.

C6-C7: Prior fusion. Mild canal stenosis predominantly related to
intrinsic canal narrowing. Suspect mild bilateral foraminal
stenosis, left slightly worse than right.

C7-T1: No significant disc protrusion, foraminal stenosis, or canal
stenosis.
IMPRESSION: 1. Large fluid collection at the operative bed of the posterior
lower cervical spine extending to the overlying skin surface along
the surgical incision site. Collection measures approximately 9.0 x
8.0 x 8.0 cm. Multiple foci of susceptibility artifact within the
collection likely represent a combination of micrometallic
postsurgical change and air. Appearance most compatible with a
postoperative seroma or hematoma. Superimposed infection cannot be
differentiated by imaging alone.
2. No evidence of epidural fluid collection.
3. Cervical spondylosis with multifactorial mild canal stenosis at
the C2-3 level predominantly related to intrinsic canal narrowing.
There is also moderate-severe right foraminal stenosis at C2-3.

## 2021-10-15 SURGERY — POSTERIOR CERVICAL FUSION/FORAMINOTOMY LEVEL 1
Anesthesia: General | Site: Spine Cervical

## 2021-10-15 MED ORDER — ALLOPURINOL 300 MG PO TABS
300.0000 mg | ORAL_TABLET | Freq: Every day | ORAL | Status: DC
  Administered 2021-10-16 – 2021-10-20 (×5): 300 mg via ORAL
  Filled 2021-10-15 (×5): qty 1

## 2021-10-15 MED ORDER — CHLORHEXIDINE GLUCONATE 0.12 % MT SOLN
OROMUCOSAL | Status: AC
  Administered 2021-10-15: 15 mL via OROMUCOSAL
  Filled 2021-10-15: qty 15

## 2021-10-15 MED ORDER — FLEET ENEMA 7-19 GM/118ML RE ENEM: 1.0000 | ENEMA | Freq: Once | RECTAL | Status: DC | PRN

## 2021-10-15 MED ORDER — COLCHICINE 0.6 MG PO TABS
0.6000 mg | ORAL_TABLET | Freq: Every day | ORAL | Status: DC | PRN
  Filled 2021-10-15: qty 1

## 2021-10-15 MED ORDER — LIDOCAINE 2% (20 MG/ML) 5 ML SYRINGE
INTRAMUSCULAR | Status: AC
  Filled 2021-10-15: qty 5

## 2021-10-15 MED ORDER — POLYETHYLENE GLYCOL 3350 17 G PO PACK: 17.0000 g | PACK | Freq: Every day | ORAL | Status: DC | PRN

## 2021-10-15 MED ORDER — FOLIC ACID 1 MG PO TABS
1.0000 mg | ORAL_TABLET | Freq: Every evening | ORAL | Status: DC
Start: 2021-10-16 — End: 2021-10-21
  Administered 2021-10-16 – 2021-10-19 (×4): 1 mg via ORAL
  Filled 2021-10-15 (×4): qty 1

## 2021-10-15 MED ORDER — ALLOPURINOL 300 MG PO TABS
300.0000 mg | ORAL_TABLET | Freq: Every day | ORAL | Status: DC
  Administered 2021-10-15: 300 mg via ORAL
  Filled 2021-10-15: qty 3

## 2021-10-15 MED ORDER — OXYCODONE HCL 5 MG PO TABS
5.0000 mg | ORAL_TABLET | ORAL | Status: DC | PRN
  Administered 2021-10-15 – 2021-10-16 (×2): 5 mg via ORAL
  Filled 2021-10-15 (×2): qty 1

## 2021-10-15 MED ORDER — LACTATED RINGERS IV SOLN: INTRAVENOUS | Status: DC | PRN

## 2021-10-15 MED ORDER — VANCOMYCIN VARIABLE DOSE PER UNSTABLE RENAL FUNCTION (PHARMACIST DOSING): Status: DC

## 2021-10-15 MED ORDER — DOCUSATE SODIUM 100 MG PO CAPS
100.0000 mg | ORAL_CAPSULE | Freq: Two times a day (BID) | ORAL | Status: DC
  Administered 2021-10-15 – 2021-10-17 (×4): 100 mg via ORAL
  Filled 2021-10-15 (×4): qty 1

## 2021-10-15 MED ORDER — ACETAMINOPHEN 325 MG PO TABS
650.0000 mg | ORAL_TABLET | Freq: Four times a day (QID) | ORAL | Status: DC | PRN
  Administered 2021-10-18 – 2021-10-19 (×2): 650 mg via ORAL
  Filled 2021-10-15 (×2): qty 2

## 2021-10-15 MED ORDER — MIDAZOLAM HCL 2 MG/2ML IJ SOLN
INTRAMUSCULAR | Status: AC
  Filled 2021-10-15: qty 2

## 2021-10-15 MED ORDER — MEPERIDINE HCL 25 MG/ML IJ SOLN: 6.2500 mg | INTRAMUSCULAR | Status: DC | PRN

## 2021-10-15 MED ORDER — ONDANSETRON HCL 4 MG/2ML IJ SOLN
INTRAMUSCULAR | Status: DC | PRN
  Administered 2021-10-15: 4 mg via INTRAVENOUS

## 2021-10-15 MED ORDER — PROMETHAZINE HCL 25 MG/ML IJ SOLN: 6.2500 mg | INTRAMUSCULAR | Status: DC | PRN

## 2021-10-15 MED ORDER — DEXAMETHASONE SODIUM PHOSPHATE 10 MG/ML IJ SOLN
INTRAMUSCULAR | Status: AC
  Filled 2021-10-15: qty 1

## 2021-10-15 MED ORDER — MIDAZOLAM HCL 2 MG/2ML IJ SOLN
INTRAMUSCULAR | Status: DC | PRN
Start: 2021-10-15 — End: 2021-10-15
  Administered 2021-10-15: 2 mg via INTRAVENOUS

## 2021-10-15 MED ORDER — DEXAMETHASONE SODIUM PHOSPHATE 10 MG/ML IJ SOLN
INTRAMUSCULAR | Status: DC | PRN
  Administered 2021-10-15: 10 mg via INTRAVENOUS

## 2021-10-15 MED ORDER — ORAL CARE MOUTH RINSE: 15.0000 mL | Freq: Once | OROMUCOSAL | Status: AC

## 2021-10-15 MED ORDER — OXYCODONE HCL 5 MG PO TABS: 10.0000 mg | ORAL_TABLET | ORAL | Status: DC | PRN

## 2021-10-15 MED ORDER — METHOCARBAMOL 500 MG PO TABS
500.0000 mg | ORAL_TABLET | Freq: Four times a day (QID) | ORAL | Status: DC | PRN
  Administered 2021-10-18: 500 mg via ORAL
  Filled 2021-10-15: qty 1

## 2021-10-15 MED ORDER — ONDANSETRON HCL 4 MG/2ML IJ SOLN
INTRAMUSCULAR | Status: AC
  Filled 2021-10-15: qty 2

## 2021-10-15 MED ORDER — ROCURONIUM BROMIDE 10 MG/ML (PF) SYRINGE
PREFILLED_SYRINGE | INTRAVENOUS | Status: DC | PRN
  Administered 2021-10-15: 80 mg via INTRAVENOUS

## 2021-10-15 MED ORDER — LISINOPRIL 20 MG PO TABS: 20.0000 mg | ORAL_TABLET | Freq: Every day | ORAL | Status: DC

## 2021-10-15 MED ORDER — VANCOMYCIN HCL 1500 MG/300ML IV SOLN
1500.0000 mg | INTRAVENOUS | Status: DC
  Administered 2021-10-16: 1500 mg via INTRAVENOUS
  Filled 2021-10-15: qty 300

## 2021-10-15 MED ORDER — ROCURONIUM BROMIDE 10 MG/ML (PF) SYRINGE
PREFILLED_SYRINGE | INTRAVENOUS | Status: AC
  Filled 2021-10-15: qty 10

## 2021-10-15 MED ORDER — FENTANYL CITRATE (PF) 250 MCG/5ML IJ SOLN
INTRAMUSCULAR | Status: DC | PRN
  Administered 2021-10-15: 100 ug via INTRAVENOUS

## 2021-10-15 MED ORDER — SUGAMMADEX SODIUM 200 MG/2ML IV SOLN
INTRAVENOUS | Status: DC | PRN
  Administered 2021-10-15: 200 mg via INTRAVENOUS

## 2021-10-15 MED ORDER — PROPOFOL 10 MG/ML IV BOLUS
INTRAVENOUS | Status: AC
  Filled 2021-10-15: qty 20

## 2021-10-15 MED ORDER — CHLORHEXIDINE GLUCONATE 0.12 % MT SOLN: 15.0000 mL | Freq: Once | OROMUCOSAL | Status: AC

## 2021-10-15 MED ORDER — ASPIRIN EC 81 MG PO TBEC
81.0000 mg | DELAYED_RELEASE_TABLET | Freq: Every day | ORAL | Status: DC
  Administered 2021-10-17 – 2021-10-20 (×4): 81 mg via ORAL
  Filled 2021-10-15 (×4): qty 1

## 2021-10-15 MED ORDER — PHENYLEPHRINE 40 MCG/ML (10ML) SYRINGE FOR IV PUSH (FOR BLOOD PRESSURE SUPPORT)
PREFILLED_SYRINGE | INTRAVENOUS | Status: AC
  Filled 2021-10-15: qty 20

## 2021-10-15 MED ORDER — FOLIC ACID 1 MG PO TABS
1.0000 mg | ORAL_TABLET | Freq: Every day | ORAL | Status: DC
  Administered 2021-10-15: 1 mg via ORAL
  Filled 2021-10-15: qty 1

## 2021-10-15 MED ORDER — SODIUM CHLORIDE 0.9% FLUSH
3.0000 mL | Freq: Two times a day (BID) | INTRAVENOUS | Status: DC
  Administered 2021-10-16 – 2021-10-20 (×6): 3 mL via INTRAVENOUS

## 2021-10-15 MED ORDER — HYDROMORPHONE HCL 1 MG/ML IJ SOLN
1.0000 mg | INTRAMUSCULAR | Status: DC | PRN
  Filled 2021-10-15: qty 1

## 2021-10-15 MED ORDER — ACETAMINOPHEN 650 MG RE SUPP: 650.0000 mg | Freq: Four times a day (QID) | RECTAL | Status: DC | PRN

## 2021-10-15 MED ORDER — VASOPRESSIN 20 UNIT/ML IV SOLN
INTRAVENOUS | Status: DC | PRN
Start: 2021-10-15 — End: 2021-10-15
  Administered 2021-10-15 (×2): 1 [IU] via INTRAVENOUS
  Administered 2021-10-15: 2 [IU] via INTRAVENOUS

## 2021-10-15 MED ORDER — HYDROMORPHONE HCL 1 MG/ML IJ SOLN: 0.2500 mg | INTRAMUSCULAR | Status: DC | PRN

## 2021-10-15 MED ORDER — LACTATED RINGERS IV BOLUS (SEPSIS)
1000.0000 mL | Freq: Once | INTRAVENOUS | Status: AC
  Administered 2021-10-15: 1000 mL via INTRAVENOUS

## 2021-10-15 MED ORDER — SALINE SPRAY 0.65 % NA SOLN
1.0000 | NASAL | Status: DC | PRN
  Filled 2021-10-15: qty 44

## 2021-10-15 MED ORDER — VASOPRESSIN 20 UNIT/ML IV SOLN
INTRAVENOUS | Status: AC
  Filled 2021-10-15: qty 1

## 2021-10-15 MED ORDER — LORATADINE 10 MG PO TABS
10.0000 mg | ORAL_TABLET | Freq: Every day | ORAL | Status: DC
  Administered 2021-10-16 – 2021-10-19 (×4): 10 mg via ORAL
  Filled 2021-10-15 (×5): qty 1

## 2021-10-15 MED ORDER — SODIUM CHLORIDE 0.9 % IV SOLN
2.0000 g | INTRAVENOUS | Status: DC
  Administered 2021-10-15 – 2021-10-16 (×2): 2 g via INTRAVENOUS
  Filled 2021-10-15 (×2): qty 2

## 2021-10-15 MED ORDER — HEPARIN SODIUM (PORCINE) 5000 UNIT/ML IJ SOLN: 5000.0000 [IU] | Freq: Three times a day (TID) | INTRAMUSCULAR | Status: DC

## 2021-10-15 MED ORDER — HYDROCHLOROTHIAZIDE 12.5 MG PO TABS: 12.5000 mg | ORAL_TABLET | Freq: Every day | ORAL | Status: DC

## 2021-10-15 MED ORDER — METOPROLOL TARTRATE 50 MG PO TABS: 50.0000 mg | ORAL_TABLET | Freq: Once | ORAL | Status: AC

## 2021-10-15 MED ORDER — SENNOSIDES-DOCUSATE SODIUM 8.6-50 MG PO TABS: 1.0000 | ORAL_TABLET | Freq: Every evening | ORAL | Status: DC | PRN

## 2021-10-15 MED ORDER — ATORVASTATIN CALCIUM 10 MG PO TABS
20.0000 mg | ORAL_TABLET | Freq: Every day | ORAL | Status: DC
  Administered 2021-10-16 – 2021-10-19 (×4): 20 mg via ORAL
  Filled 2021-10-15 (×4): qty 2

## 2021-10-15 MED ORDER — SODIUM CHLORIDE 0.9% FLUSH: 3.0000 mL | INTRAVENOUS | Status: DC | PRN

## 2021-10-15 MED ORDER — METHOCARBAMOL 1000 MG/10ML IJ SOLN
500.0000 mg | Freq: Four times a day (QID) | INTRAVENOUS | Status: DC | PRN
  Filled 2021-10-15: qty 5

## 2021-10-15 MED ORDER — ONDANSETRON HCL 4 MG PO TABS: 4.0000 mg | ORAL_TABLET | Freq: Four times a day (QID) | ORAL | Status: DC | PRN

## 2021-10-15 MED ORDER — ONDANSETRON HCL 4 MG/2ML IJ SOLN: 4.0000 mg | Freq: Four times a day (QID) | INTRAMUSCULAR | Status: DC | PRN

## 2021-10-15 MED ORDER — SODIUM CHLORIDE 0.9 % IV SOLN: 250.0000 mL | INTRAVENOUS | Status: DC

## 2021-10-15 MED ORDER — SODIUM CHLORIDE 0.9 % IV SOLN: INTRAVENOUS | Status: DC

## 2021-10-15 MED ORDER — PHENOL 1.4 % MT LIQD: 1.0000 | OROMUCOSAL | Status: DC | PRN

## 2021-10-15 MED ORDER — LACTATED RINGERS IV SOLN: INTRAVENOUS | Status: AC

## 2021-10-15 MED ORDER — PROPOFOL 10 MG/ML IV BOLUS
INTRAVENOUS | Status: DC | PRN
Start: 2021-10-15 — End: 2021-10-15
  Administered 2021-10-15: 160 mg via INTRAVENOUS

## 2021-10-15 MED ORDER — MONTELUKAST SODIUM 10 MG PO TABS
10.0000 mg | ORAL_TABLET | Freq: Every day | ORAL | Status: DC
  Administered 2021-10-16 – 2021-10-19 (×4): 10 mg via ORAL
  Filled 2021-10-15 (×4): qty 1

## 2021-10-15 MED ORDER — OXYCODONE HCL 5 MG PO TABS
5.0000 mg | ORAL_TABLET | ORAL | Status: DC | PRN
  Administered 2021-10-15: 5 mg via ORAL
  Filled 2021-10-15: qty 1

## 2021-10-15 MED ORDER — PHENYLEPHRINE HCL-NACL 20-0.9 MG/250ML-% IV SOLN
INTRAVENOUS | Status: DC | PRN
  Administered 2021-10-15: 60 ug/min via INTRAVENOUS

## 2021-10-15 MED ORDER — FENTANYL CITRATE (PF) 250 MCG/5ML IJ SOLN
INTRAMUSCULAR | Status: AC
  Filled 2021-10-15: qty 5

## 2021-10-15 MED ORDER — ENOXAPARIN SODIUM 40 MG/0.4ML IJ SOSY
40.0000 mg | PREFILLED_SYRINGE | INTRAMUSCULAR | Status: DC
  Administered 2021-10-16 – 2021-10-20 (×3): 40 mg via SUBCUTANEOUS
  Filled 2021-10-15 (×4): qty 0.4

## 2021-10-15 MED ORDER — LISINOPRIL-HYDROCHLOROTHIAZIDE 20-12.5 MG PO TABS: 1.0000 | ORAL_TABLET | Freq: Every day | ORAL | Status: DC

## 2021-10-15 MED ORDER — 0.9 % SODIUM CHLORIDE (POUR BTL) OPTIME
TOPICAL | Status: DC | PRN
  Administered 2021-10-15: 1000 mL

## 2021-10-15 MED ORDER — PHENYLEPHRINE 40 MCG/ML (10ML) SYRINGE FOR IV PUSH (FOR BLOOD PRESSURE SUPPORT)
PREFILLED_SYRINGE | INTRAVENOUS | Status: DC | PRN
  Administered 2021-10-15: 80 ug via INTRAVENOUS
  Administered 2021-10-15: 40 ug via INTRAVENOUS
  Administered 2021-10-15 (×2): 160 ug via INTRAVENOUS
  Administered 2021-10-15: 80 ug via INTRAVENOUS

## 2021-10-15 MED ORDER — MENTHOL 3 MG MT LOZG: 1.0000 | LOZENGE | OROMUCOSAL | Status: DC | PRN

## 2021-10-15 MED ORDER — ATORVASTATIN CALCIUM 10 MG PO TABS
20.0000 mg | ORAL_TABLET | Freq: Every day | ORAL | Status: DC
  Administered 2021-10-15: 20 mg via ORAL
  Filled 2021-10-15: qty 2

## 2021-10-15 MED ORDER — METOPROLOL TARTRATE 50 MG PO TABS
ORAL_TABLET | ORAL | Status: AC
  Administered 2021-10-15: 50 mg via ORAL
  Filled 2021-10-15: qty 1

## 2021-10-15 MED ORDER — LIDOCAINE 2% (20 MG/ML) 5 ML SYRINGE
INTRAMUSCULAR | Status: DC | PRN
  Administered 2021-10-15: 100 mg via INTRAVENOUS

## 2021-10-15 SURGICAL SUPPLY — 54 items
APL SKNCLS STERI-STRIP NONHPOA (GAUZE/BANDAGES/DRESSINGS) ×1
BAG COUNTER SPONGE SURGICOUNT (BAG) ×2 IMPLANT
BAG SPNG CNTER NS LX DISP (BAG) ×1
BAND INSRT 18 STRL LF DISP RB (MISCELLANEOUS)
BAND RUBBER #18 3X1/16 STRL (MISCELLANEOUS) IMPLANT
BENZOIN TINCTURE PRP APPL 2/3 (GAUZE/BANDAGES/DRESSINGS) ×2 IMPLANT
BIT DRILL NEURO 2X3.1 SFT TUCH (MISCELLANEOUS) ×1 IMPLANT
BIT DRILL WIRE PASS 1.3MM (BIT) IMPLANT
BLADE CLIPPER SURG (BLADE) IMPLANT
CANISTER SUCT 3000ML PPV (MISCELLANEOUS) ×2 IMPLANT
CANISTER WOUNDNEG PRESSURE 500 (CANNISTER) ×1 IMPLANT
DECANTER SPIKE VIAL GLASS SM (MISCELLANEOUS) ×2 IMPLANT
DRAPE C-ARM 42X72 X-RAY (DRAPES) ×4 IMPLANT
DRAPE LAPAROTOMY 100X72 PEDS (DRAPES) ×2 IMPLANT
DRAPE MICROSCOPE LEICA (MISCELLANEOUS) IMPLANT
DRAPE SURG 17X23 STRL (DRAPES) ×2 IMPLANT
DRILL NEURO 2X3.1 SOFT TOUCH (MISCELLANEOUS) ×2
DRILL WIRE PASS 1.3MM (BIT)
DRSG OPSITE POSTOP 4X6 (GAUZE/BANDAGES/DRESSINGS) ×2 IMPLANT
DRSG VAC ATS MED SENSATRAC (GAUZE/BANDAGES/DRESSINGS) ×1 IMPLANT
DURAPREP 26ML APPLICATOR (WOUND CARE) ×2 IMPLANT
ELECT COATED BLADE 2.86 ST (ELECTRODE) ×2 IMPLANT
ELECT REM PT RETURN 9FT ADLT (ELECTROSURGICAL) ×2
ELECTRODE REM PT RTRN 9FT ADLT (ELECTROSURGICAL) ×1 IMPLANT
GAUZE 4X4 16PLY ~~LOC~~+RFID DBL (SPONGE) IMPLANT
GLOVE EXAM NITRILE XL STR (GLOVE) IMPLANT
GLOVE SURG LTX SZ7.5 (GLOVE) ×2 IMPLANT
GLOVE SURG UNDER POLY LF SZ7.5 (GLOVE) ×4 IMPLANT
GOWN STRL REUS W/ TWL LRG LVL3 (GOWN DISPOSABLE) ×2 IMPLANT
GOWN STRL REUS W/ TWL XL LVL3 (GOWN DISPOSABLE) ×1 IMPLANT
GOWN STRL REUS W/TWL 2XL LVL3 (GOWN DISPOSABLE) IMPLANT
GOWN STRL REUS W/TWL LRG LVL3 (GOWN DISPOSABLE) ×4
GOWN STRL REUS W/TWL XL LVL3 (GOWN DISPOSABLE) ×2
HEMOSTAT POWDER KIT SURGIFOAM (HEMOSTASIS) ×2 IMPLANT
KIT BASIN OR (CUSTOM PROCEDURE TRAY) ×2 IMPLANT
KIT TURNOVER KIT B (KITS) ×2 IMPLANT
NDL SPNL 18GX3.5 QUINCKE PK (NEEDLE) IMPLANT
NEEDLE HYPO 22GX1.5 SAFETY (NEEDLE) ×2 IMPLANT
NEEDLE SPNL 18GX3.5 QUINCKE PK (NEEDLE) IMPLANT
NS IRRIG 1000ML POUR BTL (IV SOLUTION) ×2 IMPLANT
PACK LAMINECTOMY NEURO (CUSTOM PROCEDURE TRAY) ×2 IMPLANT
PAD ARMBOARD 7.5X6 YLW CONV (MISCELLANEOUS) ×6 IMPLANT
PIN MAYFIELD SKULL DISP (PIN) ×2 IMPLANT
SPONGE SURGIFOAM ABS GEL SZ50 (HEMOSTASIS) IMPLANT
SPONGE T-LAP 4X18 ~~LOC~~+RFID (SPONGE) IMPLANT
STRIP CLOSURE SKIN 1/2X4 (GAUZE/BANDAGES/DRESSINGS) ×2 IMPLANT
SUT MNCRL AB 4-0 PS2 18 (SUTURE) ×2 IMPLANT
SUT VIC AB 0 CT1 18XCR BRD8 (SUTURE) ×1 IMPLANT
SUT VIC AB 0 CT1 8-18 (SUTURE) ×2
SUT VIC AB 2-0 CP2 18 (SUTURE) ×2 IMPLANT
SYR 3ML LL SCALE MARK (SYRINGE) IMPLANT
TOWEL GREEN STERILE (TOWEL DISPOSABLE) ×2 IMPLANT
TOWEL GREEN STERILE FF (TOWEL DISPOSABLE) ×2 IMPLANT
WATER STERILE IRR 1000ML POUR (IV SOLUTION) ×2 IMPLANT

## 2021-10-15 NOTE — Anesthesia Preprocedure Evaluation (Addendum)
Anesthesia Evaluation  Patient identified by MRN, date of birth, ID band Patient awake    Reviewed: Allergy & Precautions, NPO status , Patient's Chart, lab work & pertinent test results  Airway Mallampati: II  TM Distance: >3 FB Neck ROM: Full    Dental no notable dental hx. (+) Dental Advisory Given   Pulmonary sleep apnea ,    Pulmonary exam normal breath sounds clear to auscultation       Cardiovascular hypertension, Pt. on home beta blockers and Pt. on medications + CAD and + Past MI  Normal cardiovascular exam Rhythm:Regular Rate:Normal     Neuro/Psych PSYCHIATRIC DISORDERS Depression  Neuromuscular disease    GI/Hepatic negative GI ROS, Neg liver ROS,   Endo/Other    Renal/GU Renal disease     Musculoskeletal  (+) Arthritis ,   Abdominal (+) + obese,   Peds  Hematology  (+) Blood dyscrasia, anemia ,   Anesthesia Other Findings   Reproductive/Obstetrics                          Anesthesia Physical  Anesthesia Plan  ASA: 3 and emergent  Anesthesia Plan: General   Post-op Pain Management: Minimal or no pain anticipated, Dilaudid IV and Ofirmev IV (intra-op)   Induction: Intravenous  PONV Risk Score and Plan: 3 and Ondansetron, Dexamethasone, Midazolam and Treatment may vary due to age or medical condition  Airway Management Planned: Oral ETT and Video Laryngoscope Planned  Additional Equipment:   Intra-op Plan:   Post-operative Plan: Extubation in OR  Informed Consent:   Plan Discussed with:   Anesthesia Plan Comments: (PAT note by Karoline Caldwell, PA-C: Follows with cardiology for history of CAD s/p NSTEMI treated with DES to OM1 in 2016.  Nuclear stress March 2019 was nonischemic.  Last seen by Filbert Berthold, PA-C at the [facility] 01/18/21 and noted to be doing well at that time, 1 year follow-up recommended.  Per note, "CAD s/p NSTEMI, DES to OM1: Stable, no c/o CP or SOB. No  restrictions to activities. Continue on aspirin, metoprolol and statin."  In March of this year patient was cleared by cardiology at the [facility] to undergo ACDF which she had on [id number] without complication.  Patient denied any cardiopulmonary complaints at preop testing visit, states he feels well from cardiac standpoint, no changes from when last seen in follow-up.  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 OSA on BiPAP. 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  Status post C3-7 ACDF, elective Glide scope has been used for intubation.  Preop labs reviewed, creatinine mildly elevated 1.57 (baseline appears to be around 1.30), WBC mildly elevated at 14.3, otherwise unremarkable.  EKG 11/19/2020: NSR.  Rate 67.  Cardiac testing per Filbert Berthold, PA-C's note 01/18/2021:  -Nuclear medicine stress testing 11/29/2017 Impression: 1. No evidence of reversible ischemia.   -2d TTE with color flow and Doppler performed 11/26/17 Mild left ventricular hypertrophy Normal left ventricular size and function, EF 0000000 Grade I diastolic dysfunction Normal right ventricular size and function No significant valvular regurgitation or stenosis IVC was not visualized E/e'=7 suggests normal left atrial pressure No pericardial effusion In comparison with the images of 07/19/2015 there has been no significant change   )     Anesthesia Quick Evaluation

## 2021-10-15 NOTE — Progress Notes (Signed)
Pharmacy Antibiotic Note  Bill [name] is a 66 y.o. male s/p recent C5 to C7 fusion admitted on 10/15/2021 with  wound infection  and abscess.  Pharmacy has been consulted for Vancomycin dosing.  Seen at [facility] yesterday ~1700 PM and received Vancomycin 2g loading dose. SCr was 2.7 at that time so with AKI and worsening at 2.68 today. Weight from [place] - 139.5 kg. Normalized CrCL ~28 mL/min  Patient noted to be on Enbrel and Methotrexate chronically so adding Cefepime empirically per discussion with ED PA for now.    Plan: Vancomycin random level ~1700 PM.  Will hold further doses at this time.  Add Cefepime 2g IV every 24 hours.  Monitor culture results, clinical status, and need for levels.     Temp (24hrs), Avg:98.8 F (37.1 C), Min:98.8 F (37.1 C), Max:98.8 F (37.1 C)  No results for input(s): WBC, CREATININE, LATICACIDVEN, VANCOTROUGH, VANCOPEAK, VANCORANDOM, GENTTROUGH, GENTPEAK, GENTRANDOM, TOBRATROUGH, TOBRAPEAK, TOBRARND, AMIKACINPEAK, AMIKACINTROU, AMIKACIN in the last 168 hours.  CrCl cannot be calculated (Patient's most recent lab result is older than the maximum 21 days allowed.).    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  Link Snuffer, PharmD, BCPS, BCCCP Clinical Pharmacist Please refer to [facility] for [facility] Pharmacy numbers 10/15/2021 9:36 AM

## 2021-10-15 NOTE — Progress Notes (Signed)
Pharmacy Antibiotic Note  Bill [name] is a 66 y.o. male s/p recent C5 to C7 fusion admitted on 10/15/2021 with  wound infection  and abscess.  Pharmacy has been consulted for Vancomycin dosing.  Seen at [facility] yesterday ~1700 PM and received Vancomycin 2g loading dose. SCr was 2.7 at that time so with AKI and worsening at 2.68 today. Weight from [place] - 139.5 kg. Normalized CrCL ~28 mL/min  Patient noted to be on Enbrel and Methotrexate chronically so adding Cefepime empirically per discussion with ED PA for now.   Vancomycin random level this evening is 13 - unclear timing after last dose since was given at outside hospital.   Plan: Vancomycin 1500 mg IV q 36 hrs for now - will need to watch Scr trend and adjust accordingly. Calculated AUC 520 based on Scr 2.68.  Continue Cefepime 2g IV every 24 hours.  Monitor culture results, clinical status, and need for levels.  Height: 5\' 10"  (177.8 cm) Weight: 134.4 kg (296 lb 6.2 oz) IBW/kg (Calculated) : 73  Temp (24hrs), Avg:98.2 F (36.8 C), Min:97.7 F (36.5 C), Max:99 F (37.2 C)  Recent Labs  Lab 10/15/21 0829 10/15/21 0850 10/15/21 2019  WBC  --  8.4  --   CREATININE  --  2.68*  --   LATICACIDVEN 2.3*  --  1.4  VANCORANDOM  --   --  13     Estimated Creatinine Clearance: 38.9 mL/min (A) (by C-G formula based on SCr of 2.68 mg/dL (H)).    Allergies  Allergen Reactions   Amlodipine Swelling   Duloxetine     Other reaction(s): MALE ERECTILE DISORDER    Antimicrobials this admission: Vanc 1/27 (dose at [facility]) >> Cefepime 1/28 >>  Dose adjustments this admission:   Microbiology results: 1/28 BCx >>  Thank you for allowing pharmacy to be a part of this patients care.  2/28, Reece Leader, BCCP Clinical Pharmacist  10/15/2021 9:36 PM   [facility] pharmacy phone numbers are listed on [email or web address]

## 2021-10-15 NOTE — ED Notes (Signed)
NP at bedside.

## 2021-10-15 NOTE — Consult Note (Addendum)
Reason for Consult:Post op wound drainage  Referring Physician: Carlisle Cater, PA-C   HPI: Bill [name] is a 66 y.o. male who is S/P posterior cervical instrumented fusion for pseudoarthrosis performed by Dr. Venetia Constable on 09/20/2021. The patient was discharged in stable condition. He was seen in the outpatient setting on 10/12/2021 for a postop visit and was noted to be recovering appropriately without any concerning examination findings. Per the patient, he began having discomfort in his right scapula the night after his postop office visit. He went to bed as usual and woke up Thursday morning with a fever of 102 F. Friday, his surgical wound began to drain. Friday evening, he presented to the ED at the [facility] in [place] for evaluation. Pertinent findings during his visit at the [facility] included WBCs of 17.1, hgb 12.0, creatinine 2.7, procalcitonin 8.75, ESR 70. A noncontrasted cervical CT was performed and showed ill-defined fluid collection and foci of air between the C3 and T1 levels.  He was given 1 dose of vancomycin 2 g. NSX was consulted by the EDP at the [place]. The patient was stable and requested to be discharged home to attend to his pets and follow up at [facility] in the morning.     Past Medical History:  Diagnosis Date   ADHD (attention deficit hyperactivity disorder)    Anemia    Ankylosing spondylitis ([place])    Chronic kidney disease    Coronary artery disease    Depression    Gout    Hearing loss    Hyperlipidemia    Hypertension    Morbid obesity with BMI of 40.0-44.9, adult ([place])    Myocardial infarction (HCC)    OSA treated with BiPAP    Restrictive lung disease    Spondylarthritis     Past Surgical History:  Procedure Laterality Date   ANTERIOR CERVICAL DECOMP/DISCECTOMY FUSION N/A 12/09/2020   Procedure: Cervical Six-Seven Anterior Cervical Decompression/Discectomy Fusio;  Surgeon: Judith Part, MD;  Location: [place];  Service: Neurosurgery;  Laterality: N/A;  3C    ANTERIOR FUSION CERVICAL SPINE  ~ 2010   "carbon cage C3-C7"   BACK SURGERY     HERNIA REPAIR  03/11/2015   INCISIONAL HERNIA REPAIR  03/11/2015   w/mesh   INCISIONAL HERNIA REPAIR N/A 03/11/2015   Procedure: OPEN INCISIONAL HERNIA REPAIR WITH MESH ;  Surgeon: Coralie Keens, MD;  Location: [place];  Service: General;  Laterality: N/A;   INGUINAL HERNIA REPAIR Bilateral early 2000's; 03/11/2015   w/mesh; w/mesh   INGUINAL HERNIA REPAIR Bilateral 03/11/2015   Procedure: LAPAROSCOPIC CONVERTED TO BILATERAL INGUINAL HERNIA REPAIR WITH MESH;  Surgeon: Coralie Keens, MD;  Location: [place];  Service: General;  Laterality: Bilateral;   KNEE ARTHROSCOPY Left ~ 2009   LAPAROSCOPIC CHOLECYSTECTOMY  ~ 2012   NASAL SINUS SURGERY  2005   POSTERIOR CERVICAL FUSION/FORAMINOTOMY N/A 09/20/2021   Procedure: Cervical five to Cervical seven Posterior instrumented fusion;  Surgeon: Judith Part, MD;  Location: [place];  Service: Neurosurgery;  Laterality: N/A;   POSTERIOR LUMBAR FUSION     L4-L5 screws and rods   TONSILLECTOMY      History reviewed. No pertinent family history.  Social History:  reports that he has never smoked. He has never used smokeless tobacco. He reports that he does not currently use alcohol. He reports that he does not use drugs.  Allergies:  Allergies  Allergen Reactions   Amlodipine Swelling   Duloxetine     Other reaction(s):  MALE ERECTILE DISORDER    Medications: I have reviewed the patient's current medications.  No results found for this or any previous visit (from the past 48 hour(s)).  No results found.  ROS: Per HPI Blood pressure 112/63, pulse 93, temperature 98.8 F (37.1 C), resp. rate (!) 24, SpO2 97 %.  Physical Exam: Patient is awake, A/O X 4, and appropriately conversant with fluent speech. They are in NAD and VSS. MAEW with good strength that is symmetric bilaterally. 5/5 BUE/BLE. Sensation to light touch is intact. PERLA, EOMI. CNs grossly intact.  Incision has a small area of dehiscence at the most superior aspect of the incision that is draining purulent appearing drainage.     Assessment/Plan: 66 y.o. male who is S/P posterior cervical instrumented fusion on 09/20/2021. The patient was recovering as expected until Thursday when he began having a fever of 102 and subsequent wound drainage on Friday. He was initially evaluated at the [facility] and was requested for him to be sent to [facility] for medical management and neurosurgical evaluation. On examination the patient has full strength on confrontational testing in his bilateral upper and bilateral lower extremities.  His neurological examination is at his baseline.  His surgical wound is draining purulent type fluid from a small dehiscence in the most superior aspect of the incision.  We will obtain a noncontrasted cervical spine MRI to better assess the patient's surgical site and make recommendations as how to proceed once the MRI has been completed.   -Cervical MRI without contrast, we will hold the contrast due to AKI -ID consult -Medicine admit   Marvis Moeller, DNP, NP-C 10/15/2021, 9:30 AM    Patient seen and examined.  MRI reviewed.  Fairly large subcutaneous and subfascial collection, with high suspicion of infection given his elevated procalcitonin, ESR, and leukocytosis at the [place].  He has AKI as well.  Plan for incision, drainage, likely wound vac given size of wound cavity.  Risks, benefits, alternatives, and expected convalescence discussed with patient.  He wished to proceed.

## 2021-10-15 NOTE — Transfer of Care (Signed)
Immediate Anesthesia Transfer of Care Note  Patient: Bill [name]  Procedure(s) Performed: IRRIGATION AND DEBRIDEMENT OF POSTERIOR CERVICAL WOUND WITH PLACEMENT OF WOUND VAC (Spine Cervical)  Patient Location: PACU  Anesthesia Type:General  Level of Consciousness: awake and alert   Airway & Oxygen Therapy: Patient Spontanous Breathing and Patient connected to face mask oxygen  Post-op Assessment: Report given to RN, Post -op Vital signs reviewed and stable and Patient moving all extremities X 4  Post vital signs: Reviewed and stable  Last Vitals:  Vitals Value Taken Time  BP 116/42 10/15/21 1704  Temp    Pulse 107 10/15/21 1701  Resp 17 10/15/21 1708  SpO2 97 % 10/15/21 1701  Vitals shown include unvalidated device data.  Last Pain:  Vitals:   10/15/21 1514  TempSrc: Oral  PainSc:          Complications: No notable events documented.

## 2021-10-15 NOTE — H&P (Signed)
Date: 10/15/2021               Patient Name:  Bill [name] MRN: [id number]  DOB: 10-11-1955 Age / Sex: 66 y.o., male   PCP[facility] Service: Internal Medicine Teaching Service         Attending Physician: Dr. Deno Etienne, DO    First Contact: Scarlett Presto, MD Pager: AD [phone]  Second Contact: Sanjuan Dame, MD Pager: Rudean Curt [phone]       After Hours (After 5p/  First Contact Pager: [phone]  weekends / holidays): Second Contact Pager: [phone]   SUBJECTIVE   Chief Complaint: Wound drainage  History of Present Illness:   Bill [name] is a 66 y.o. M with a PMH of ankylosing spondylitis, CKD, HTN, HLD, CAD, and posterior cervical fusion who presented with increased drainage of his surgical site and fevers that started Wednesday. He had his follow up appointment with his surgeon on Wednesday and everything looked great. He was told to follow up again in May. However that night he started to feel warm and noticed he had a fever to 102 and he had some increased pain at his wound site. He then noticed that his wound was draining on Friday and went to get evaluated at the [place]. They said that his white blood cells were elevated and that he probably had an infection. They started IV antibiotics on him and were going to transfer him to [facility] however patient wanted to go home to get his affairs in order and make sure someone could watch his dog. He came back to the hospital today for follow up. Patient reports compliance with his medications however he has not taken his methotrexate or enbrel this week. He last took aspirin Thursday.  Meds:  No outpatient medications have been marked as taking for the 10/15/21 encounter [facility] Encounter).   No current facility-administered medications on file prior to encounter.   Current Outpatient Medications on File Prior to Encounter  Medication Sig Dispense Refill   allopurinol (ZYLOPRIM) 300 MG tablet Take 300 mg by  mouth daily.     aspirin EC 81 MG tablet Take 1 tablet (81 mg total) by mouth daily. Swallow whole. Restart on 12/14/20 30 tablet 11   atorvastatin (LIPITOR) 20 MG tablet Take 20 mg by mouth at bedtime.     cetirizine (ZYRTEC) 10 MG tablet Take 10 mg by mouth daily.     Cholecalciferol (VITAMIN D) 50 MCG (2000 UT) tablet Take 2,000 Units by mouth daily.     colchicine 0.6 MG tablet Take 0.6 mg by mouth daily as needed (Gout).     etanercept (ENBREL) 50 MG/ML injection Inject 50 mg into the skin every Saturday.     folic acid (FOLVITE) 1 MG tablet Take 1 mg by mouth daily.     lisinopril-hydrochlorothiazide (PRINZIDE,ZESTORETIC) 20-12.5 MG per tablet Take 1 tablet by mouth daily.     methotrexate (RHEUMATREX) 2.5 MG tablet Take 15 mg by mouth every Wednesday. Caution:Chemotherapy. Protect from light.     metoprolol tartrate (LOPRESSOR) 50 MG tablet Take 50 mg by mouth daily.     montelukast (SINGULAIR) 10 MG tablet Take 10 mg by mouth at bedtime.     oxyCODONE (OXY IR/ROXICODONE) 5 MG immediate release tablet Take 1 tablet (5 mg total) by mouth every 4 (four) hours as needed for moderate pain ((score 4 to 6)). 30 tablet 0     Past  Medical History:  Diagnosis Date   ADHD (attention deficit hyperactivity disorder)    Anemia    Ankylosing spondylitis ([place])    Chronic kidney disease    Coronary artery disease    Depression    Gout    Hearing loss    Hyperlipidemia    Hypertension    Morbid obesity with BMI of 40.0-44.9, adult ([place])    Myocardial infarction (HCC)    OSA treated with BiPAP    Restrictive lung disease    Spondylarthritis     Past Surgical History:  Procedure Laterality Date   ANTERIOR CERVICAL DECOMP/DISCECTOMY FUSION N/A 12/09/2020   Procedure: Cervical Six-Seven Anterior Cervical Decompression/Discectomy Fusio;  Surgeon: Judith Part, MD;  Location: [place];  Service: Neurosurgery;  Laterality: N/A;  3C   ANTERIOR FUSION CERVICAL SPINE  ~ 2010   "carbon cage  C3-C7"   BACK SURGERY     HERNIA REPAIR  03/11/2015   INCISIONAL HERNIA REPAIR  03/11/2015   w/mesh   INCISIONAL HERNIA REPAIR N/A 03/11/2015   Procedure: OPEN INCISIONAL HERNIA REPAIR WITH MESH ;  Surgeon: Coralie Keens, MD;  Location: [place];  Service: General;  Laterality: N/A;   INGUINAL HERNIA REPAIR Bilateral early 2000's; 03/11/2015   w/mesh; w/mesh   INGUINAL HERNIA REPAIR Bilateral 03/11/2015   Procedure: LAPAROSCOPIC CONVERTED TO BILATERAL INGUINAL HERNIA REPAIR WITH MESH;  Surgeon: Coralie Keens, MD;  Location: [place];  Service: General;  Laterality: Bilateral;   KNEE ARTHROSCOPY Left ~ 2009   LAPAROSCOPIC CHOLECYSTECTOMY  ~ 2012   NASAL SINUS SURGERY  2005   POSTERIOR CERVICAL FUSION/FORAMINOTOMY N/A 09/20/2021   Procedure: Cervical five to Cervical seven Posterior instrumented fusion;  Surgeon: Judith Part, MD;  Location: [place];  Service: Neurosurgery;  Laterality: N/A;   POSTERIOR LUMBAR FUSION     L4-L5 screws and rods   TONSILLECTOMY      Social:  Lives alone, has a dog at home Occupation: works for [employer]: 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 No tobacco use.  Family History:  Heart disease in his brother No history of autoimmune disease or diabetes  Allergies: Allergies as of 10/15/2021 - Review Complete 10/15/2021  Allergen Reaction Noted   Amlodipine Swelling 08/09/2018   Duloxetine  09/20/2020    Review of Systems: A complete ROS was negative except as per HPI.   OBJECTIVE:   Physical Exam: Blood pressure 101/60, pulse 90, temperature 98.8 F (37.1 C), resp. rate 17, SpO2 95 %.  Constitutional: well-appearing obese gentleman laying in bed, in no acute distress HENT: normocephalic atraumatic, mucous membranes moist Eyes: conjunctiva non-erythematous Neck: supple Cardiovascular: regular rate and rhythm, no m/r/g Pulmonary/Chest: normal work of  breathing on room air, lungs clear to auscultation bilaterally Abdominal: soft, non-tender, distended MSK: normal bulk and tone Neurological: alert & oriented x 3 Skin: post-operative neck incision with purulent drainage from opening at the superior aspect with some warmth and erythema. No fluctuance or lymphadenopathy Psych: normal affect    Labs: CBC    Component Value Date/Time   WBC 8.4 10/15/2021 0850   RBC 3.70 (L) 10/15/2021 0850   HGB 11.1 (L) 10/15/2021 0850   HCT 35.3 (L) 10/15/2021 0850   PLT 243 10/15/2021 0850   MCV 95.4 10/15/2021 0850   MCH 30.0 10/15/2021 0850   MCHC 31.4 10/15/2021 0850   RDW 14.2 10/15/2021 0850   LYMPHSABS 0.5 (L) 10/15/2021 [id number]  MONOABS 0.7 10/15/2021 0850   EOSABS 0.1 10/15/2021 0850   BASOSABS 0.1 10/15/2021 0850     CMP     Component Value Date/Time   NA 133 (L) 10/15/2021 0850   K 3.6 10/15/2021 0850   CL 99 10/15/2021 0850   CO2 22 10/15/2021 0850   GLUCOSE 111 (H) 10/15/2021 0850   BUN 45 (H) 10/15/2021 0850   CREATININE 2.68 (H) 10/15/2021 0850   CALCIUM 8.4 (L) 10/15/2021 0850   PROT 7.0 10/15/2021 0850   ALBUMIN 2.8 (L) 10/15/2021 0850   AST 94 (H) 10/15/2021 0850   ALT 83 (H) 10/15/2021 0850   ALKPHOS 118 10/15/2021 0850   BILITOT 2.7 (H) 10/15/2021 0850   GFRNONAA 26 (L) 10/15/2021 0850   GFRAA >60 03/12/2015 0318    Imaging: No results found.  EKG: personally reviewed my interpretation is normal sinus rhythm   ASSESSMENT & PLAN:    Assessment & Plan by Problem: Active Problems:   * No active hospital problems. *   Bill [name] is a 66 y.o. with pertinent PMH of ankylosing spondylitis, CKD, HTN, HLD, CAD, and posterior cervical fusion who presented with fever and purulent wound drainage admitted for postoperative wound infection. 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 Received one dose vanc/cefepime and was sent her for continued care. Patient likely has post-operative infection. Will obtain further imaging to evaluate for extent of involvement. Overall non-toxic appearing and not in any acute distress or pain.  - neurosurgery following, appreciate assistance - f/u non-con MRI - vanc per pharmacy, cefepime - f/u blood cultures - daily CBC -oxy 5 Q4 PRN  #ankylosing spondylitis Has not had both of his medications for a week. Will hold for now in the setting of acute infection. - holding etanercept and methotrexate - continue folic acid  #AKI on CKD Patient has a baseline creatinine around 1.3, creatinine elevated to 2.68 on admission in the setting of poor PO intake. - avoid nephrotoxic medications - daily BMP  #Elevated liver enzymes Possible history of fatty liver on care everywhere documents.  - f/u A1c - daily cmp  #HTN Takes lisinopril HCTZ at home as well as metoprolol - holding BP meds  #HLD -continue atorvastatin  #OSA -CPAP QHS  #CAD #Hx of NSTEMI in 2016 Had stress tesitng in 2019 which showed no reversible ischemia. EF at that time was 55-60% and showed grade 1 diastolic dysfunction. - holding aspirin - holding metoprolol   #Anemia No signs of active bleeding at this time. Stable. - daily cbc  #Gout -continue allopurinol 0.6 daily  Diet: NPO for possible procedure VTE: heparin IVF: LR 100 mL/hr Code: Full  Prior to Admission Living Arrangement: Home, living alone Anticipated Discharge Location: Home Barriers to Discharge: continued medical workup  Dispo: Admit patient to Inpatient with expected length of stay greater than 2 midnights.  Signed: Scarlett Presto, MD Internal Medicine Resident PGY-1 Pager: [phone]  10/15/2021, 9:56 AM

## 2021-10-15 NOTE — Anesthesia Postprocedure Evaluation (Signed)
Anesthesia Post Note  Patient: Bill [name]  Procedure(s) Performed: IRRIGATION AND DEBRIDEMENT OF POSTERIOR CERVICAL WOUND WITH PLACEMENT OF WOUND VAC (Spine Cervical)     Patient location during evaluation: PACU Anesthesia Type: General Level of consciousness: sedated and patient cooperative Pain management: pain level controlled Vital Signs Assessment: post-procedure vital signs reviewed and stable Respiratory status: spontaneous breathing Cardiovascular status: stable Anesthetic complications: no   No notable events documented.  Last Vitals:  Vitals:   10/15/21 1735 10/15/21 1803  BP: (!) 106/51 (!) 109/54  Pulse: 91 81  Resp: (!) 22 18  Temp: 36.6 C 36.9 C  SpO2: 100% 100%    Last Pain:  Vitals:   10/15/21 1803  TempSrc: Oral  PainSc: 4                  Lewie Loron

## 2021-10-15 NOTE — ED Triage Notes (Signed)
Pt. Stated, I had C5 to C7 fused on January 3 and saw the Dr. On Wednesday and Thursday I started running  fever and had a pus pockets . I went to [place] last night in [place] and was given IV antibiotics and sent home and said I need to come back here to be admitted for IV antibiotics.

## 2021-10-15 NOTE — Progress Notes (Signed)
RT NOTE:  Pt refuses CPAP tonight. Pt states he will sleep with hob elevated.

## 2021-10-15 NOTE — Hospital Course (Addendum)
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  He initially presented with a complaint of fever with T-max of 102 as well as increased pain and drainage at surgical site.  MRI on admission showed a large fluid collection at the surgical site measuring 9 x 8 x 8 cm.  Patient underwent incision and drainage 01/28 by neurosurgery and was found to have significant purulence, fascial dehiscence, necrosis at the surgical site.  A wound VAC was placed at that time. He was initiated on vancomycin and cefepime on 01/28.  Gram stain of fluid from the wound grew gram-positive cocci and culture showed staph aureus.  On 01/30 cefepime was discontinued.  Patient had a PICC line placed for outpatient antibiotic therapy.  Blood culture resulted positive for staph aureus in 1/4 bottles on 01/31. Vancomycin was discontinued at that time with initiation of cefazolin 2g TID. Of note patient did have gradual increase of leukocytosis with WBC count of 16.1 on day of discharge; he was afebrile and hemodynamically stable throughout admission. RUE ultrasound to rule out SVT or DVT given erythema and tenderness around previous peripheral IV site which was negative. Echocardiogram was obtained to rule out vegetations and showed a highly mobile echodense mass on the posterior leaflet of the mitral valve concerning for calcification vs. Vegetation, recommended for TEE.  Patient prefers to have procedure as outpatient.  On day of discharge patient had progressive leukocytosis further to 19.1. He was discharged after PICC line removed. He will need to receive 8 weeks of therapy with last day of 11/12/2021.   Ankylosing spondylitis Chronic condition.  Home medications of etanercept and methotrexate held during this admission.  Folic acid was continued.   AKI on CKD Admission creatinine of 2.68.  This gradually improved  throughout his admission with creatinine of 1.04 on day of discharge.   Elevated liver enzymes; resolved Admission labs revealed AST of 94, ALT of 83, total bilirubin of 2.7.  These values resolved to normal limits throughout admission and were within normal limits prior to discharge.   Prediabetes HbA1c noted to be 5.8%.   HTN Chronic condition.  Home medications initially held.  Metoprolol restarted 01/29, lisinopril-HCTZ restarted 01/30.    HLD Chronic condition. Continued home atorvastatin.   OSA Chronic condition.  CPAP nightly.

## 2021-10-15 NOTE — ED Notes (Signed)
Patient transported to MRI 

## 2021-10-15 NOTE — ED Provider Notes (Signed)
[facility] EMERGENCY DEPARTMENT Provider Note   CSN: [id number] Arrival date & time: 10/15/21  4098     History  Chief Complaint  Patient presents with   Wound Infection   Post-op Problem    Bill [name] is a 66 y.o. male.  Patient with history of C5-C7 posterior spinal instrumented fusion for cervical pseudoarthrosis performed by Dr. Venetia Constable on 09/20/2021, history of hypertension, history of etanercept and methotrexate use for ankylosing spondylitis--presents to the emergency department for evaluation of potential postoperative wound infection.  Patient was seen at the [facility] emergency department in [place], [place] yesterday.  Pertinent findings during that visit included white blood cell count of 17.1, hemoglobin 12.0, creatinine 2.7, procalcitonin 8.75, ESR 70.  Patient had a temperature of 100.1 F.  He had a noncontrasted cervical CT that showed ill-defined fluid collection and foci of air between the C3 and T1 levels.  He was given 1 dose of vancomycin 2 g.  Per note, case was discussed by medical staff there with Kavin Leech and patient was felt okay for discharge and follow-up in the [facility] emergency department this morning.  Patient reports starting to have fevers 2 days ago.  T-max 102 F.  Yesterday he had increasing drainage from the wound site which prompted his emergency department visit.  Patient has had decreased appetite.  Last dose of methotrexate was about 10 days ago (skipped planned dose on 25 Jan) and last dose of Enbrel was 23 Jan.       Home Medications Prior to Admission medications   Medication Sig Start Date End Date Taking? Authorizing Provider  allopurinol (ZYLOPRIM) 300 MG tablet Take 300 mg by mouth daily.    [provider]  aspirin EC 81 MG tablet Take 1 tablet (81 mg total) by mouth daily. Swallow whole. Restart on 12/14/20 12/09/20   Judith Part, MD  atorvastatin (LIPITOR) 20 MG tablet Take 20 mg by mouth at  bedtime.    [provider]  cetirizine (ZYRTEC) 10 MG tablet Take 10 mg by mouth daily.    [provider]  Cholecalciferol (VITAMIN D) 50 MCG (2000 UT) tablet Take 2,000 Units by mouth daily.    [provider]  colchicine 0.6 MG tablet Take 0.6 mg by mouth daily as needed (Gout). 12/08/19   [provider]  etanercept (ENBREL) 50 MG/ML injection Inject 50 mg into the skin every Saturday.    [provider]  folic acid (FOLVITE) 1 MG tablet Take 1 mg by mouth daily.    [provider]  lisinopril-hydrochlorothiazide (PRINZIDE,ZESTORETIC) 20-12.5 MG per tablet Take 1 tablet by mouth daily.    [provider]  methotrexate (RHEUMATREX) 2.5 MG tablet Take 15 mg by mouth every Wednesday. Caution:Chemotherapy. Protect from light.    [provider]  metoprolol tartrate (LOPRESSOR) 50 MG tablet Take 50 mg by mouth daily. 01/21/20   [provider]  montelukast (SINGULAIR) 10 MG tablet Take 10 mg by mouth at bedtime.    [provider]  oxyCODONE (OXY IR/ROXICODONE) 5 MG immediate release tablet Take 1 tablet (5 mg total) by mouth every 4 (four) hours as needed for moderate pain ((score 4 to 6)). 09/21/21   Judith Part, MD      Allergies    Amlodipine and Duloxetine    Review of Systems   Review of Systems  Physical Exam Updated Vital Signs BP 118/74 (BP Location: Left Arm)    Pulse (!) 107  Temp 98.8 F (37.1 C)    Resp 18    SpO2 98%  Physical Exam Vitals and nursing note reviewed.  Constitutional:      General: He is not in acute distress.    Appearance: He is well-developed.  HENT:     Head: Normocephalic and atraumatic.  Eyes:     General:        Right eye: No discharge.        Left eye: No discharge.     Conjunctiva/sclera: Conjunctivae normal.  Cardiovascular:     Rate and Rhythm: Normal rate and regular rhythm.     Heart sounds: Normal heart sounds.  Pulmonary:     Effort:  Pulmonary effort is normal.     Breath sounds: Normal breath sounds.  Abdominal:     Palpations: Abdomen is soft.     Tenderness: There is no abdominal tenderness.  Musculoskeletal:     Cervical back: Normal range of motion and neck supple.     Comments: Patient with wound over the posterior aspect of the C-spine.  No signs of dehiscence but there is a fair amount of yellow, cloudy drainage that has soaked the bandaging and is on the [place] behind the patient.  Minimal erythema without signs of significant cellulitis.  Minimal surrounding tenderness.  Skin:    General: Skin is warm.  Neurological:     Mental Status: He is alert.    ED Results / Procedures / Treatments   Labs (all labs ordered are listed, but only abnormal results are displayed) Labs Reviewed  COMPREHENSIVE METABOLIC PANEL - Abnormal; Notable for the following components:      Result Value   Sodium 133 (*)    Glucose, Bld 111 (*)    BUN 45 (*)    Creatinine, Ser 2.68 (*)    Calcium 8.4 (*)    Albumin 2.8 (*)    AST 94 (*)    ALT 83 (*)    Total Bilirubin 2.7 (*)    GFR, Estimated 26 (*)    All other components within normal limits  CBC WITH DIFFERENTIAL/PLATELET - Abnormal; Notable for the following components:   RBC 3.70 (*)    Hemoglobin 11.1 (*)    HCT 35.3 (*)    Lymphs Abs 0.5 (*)    Abs Immature Granulocytes 0.10 (*)    All other components within normal limits  PROTIME-INR - Abnormal; Notable for the following components:   Prothrombin Time 17.4 (*)    INR 1.4 (*)    All other components within normal limits  CULTURE, BLOOD (ROUTINE X 2)  CULTURE, BLOOD (ROUTINE X 2)  RESP PANEL BY RT-PCR (FLU A&B, COVID) ARPGX2  APTT  LACTIC ACID, PLASMA  LACTIC ACID, PLASMA  URINALYSIS, ROUTINE W REFLEX MICROSCOPIC  VANCOMYCIN, RANDOM    EKG None  Radiology No results found.  Procedures Procedures    Medications Ordered in ED Medications  ceFEPIme (MAXIPIME) 2 g in sodium chloride 0.9 % 100 mL  IVPB (2 g Intravenous New Bag/Given 10/15/21 0951)  vancomycin variable dose per unstable renal function (pharmacist dosing) (has no administration in time range)  lactated ringers bolus 1,000 mL (1,000 mLs Intravenous New Bag/Given 10/15/21 1610)    ED Course/ Medical Decision Making/ A&P                           Medical Decision Making Amount and/or Complexity of Data Reviewed Labs: ordered. ECG/medicine tests:  ordered.   Patient seen and examined. History obtained directly from patient as well as independent review of external emergency department records from the [place]. Reviewed work-up, treatment, and plan performed there yesterday.  Labs/EKG: Ordered sepsis work-up including CBC, CMP, lactic acid and blood cultures.  COVID pending.  Imaging: Ordered MRI without contrast after discussion with neurosurgery.  Medications/Fluids: Ordered: IV vancomycin and IV cefepime, 1 L LR fluid bolus. Considered administration of: IV pain or nausea medications however patient declines.  Most recent vital signs reviewed and are as follows: BP (!) 124/59    Pulse 93    Temp 98.8 F (37.1 C)    Resp 19    SpO2 97%   Initial impression: Postoperative wound infection, acute on chronic kidney injury.  I consulted and discussed case with on-call neurosurgery Fenton Malling, DNP.  They agree with continued antibiotics.  Request admission to medical service due to kidney injury and immunocompromise state.  I discussed vancomycin administration with ED pharmacist.  Plan is to check vancomycin level around 5 PM prior to next dose.  In addition, we will add cefepime given that patient takes immunocompromising medications.  Plan for de-escalation of antibiotic coverage as able as inpatient.  10:09 AM Reassessment performed. Patient appears comfortable, stable.   Labs and imaging to this point personally reviewed and interpreted including: Lactate elevated to 2.3, CBC shows improved white blood cell count  today that is normal.  We will continue IV fluids for elevated lactate.  MAP greater than 65 and 30 cc/kg fluid bolus not ordered.  Creatinine stable at 2.68 with normal potassium.  BUN 45 and patient without signs of overt uremia.  Hemoglobin 11.1.  Most current vital signs reviewed and are as follows: BP (!) 124/59    Pulse 93    Temp 98.8 F (37.1 C)    Resp 19    SpO2 97%   Plan: Admission to hospital, pending neurosurgery consult and medicine admit.    Patient here with clear postoperative wound infection.  Also with acute kidney injury suspect related to recent poor oral intake.  IV antibiotics and hydration have been initiated.   CRITICAL CARE Performed by: Carlisle Cater PA-C Total critical care time: 40 minutes Critical care time was exclusive of separately billable procedures and treating other patients. Critical care was necessary to treat or prevent imminent or life-threatening deterioration. Critical care was time spent personally by me on the following activities: development of treatment plan with patient and/or surrogate as well as nursing, discussions with consultants, evaluation of patient's response to treatment, examination of patient, obtaining history from patient or surrogate, ordering and performing treatments and interventions, ordering and review of laboratory studies, ordering and review of radiographic studies, pulse oximetry and re-evaluation of patient's condition.         Final Clinical Impression(s) / ED Diagnoses Final diagnoses:  Wound infection after surgery  Acute kidney injury [facility])  Immunocompromised state due to drug therapy [facility])    Rx / DC Orders ED Discharge Orders     None         Carlisle Cater, PA-C 10/15/21 [place], DO 10/15/21 1019

## 2021-10-15 NOTE — Op Note (Signed)
Procedure(s): IRRIGATION AND DEBRIDEMENT OF POSTERIOR CERVICAL WOUND WITH PLACEMENT OF WOUND VAC Procedure Note  Bill [name] male 66 y.o. 10/15/2021  Procedure(s) and Anesthesia Type:    * IRRIGATION AND DEBRIDEMENT OF POSTERIOR CERVICAL WOUND WITH PLACEMENT OF WOUND VAC - General  Surgeon(s) and Role:    Bill [name], Bill Saunas, MD - Primary   Indications:  this is a 66 year old man who 3 weeks ago underwent C5-7 posterior instrumented fusion with Bill [name].  He developed purulent drainage from his wound and was found to have leukocytosis as well as elevated inflammatory markers and AKI.  On imaging, he had a large fluid collection in his wound.  I recommended incision and drainage and possible wound VAC placement.  Risk, benefits, alternatives, and expect convalescence were discussed with him.  He wished to proceed with surgery.  Informed consent was obtained.   Surgeon: Bill [name]   Assistants: Bill Barrier, NP.  Please note, there were no qualified trainees available to assist with the procedure  anesthesia: General endotracheal anesthesia  ASA Class: 3  Procedure Detail  IRRIGATION AND DEBRIDEMENT OF POSTERIOR CERVICAL WOUND WITH PLACEMENT OF WOUND VAC  The patient was brought to the op room.  General anesthesia was induced and patient was intubated by the anesthesia service.  After appropriate lines and monitors placed, patient positioned prone on chest rolls and head in a horseshoe with all pressure points padded and eyes protected.  His lower neck was prepped and draped in sterile fashion.  There was a fair amount of purulence emanating from the wound during prep.  A timeout was performed.  The superior aspect of the wound had dehisced and the rest of the incision was opened sharply with a 10 blade.  Large amount of purulence was encountered and was sent for culture.  The infection appeared to have caused fascial dehiscence as well as significant necrosis of his  subcutaneous tissue so the wound was gaping open after incision of the skin.  Self-retaining retractors were used and purulent material was removed off of the posterior cervical spine.  Phlegmon mixed with allograft was removed.  After thorough debridement and irrigation, meticulous hemostasis was obtained.  What was left of the fascia as well as muscle was closed with 0 Vicryl stitches.  Given the significant defect in his subcutaneous tissue, primary closure was not feasible and a wound VAC was placed and then sealed and attached to suction with good seal.  Patient was then flipped supine and extubated by the anesthesia service.  All counts were correct and the end of surgery.  No complications were noted.    Findings: Large amount of purulence in the wound  Estimated Blood Loss:  less than 50 mL         Drains: Wound VAC         Total IV Fluids: See anesthesia records  Blood Given: none          Specimens: Subcutaneous and subfascial wound fluid         Implants: None        Complications:  * No complications entered in OR log *         Disposition: PACU - hemodynamically stable.         Condition: stable

## 2021-10-15 NOTE — Anesthesia Procedure Notes (Signed)
Procedure Name: Intubation Date/Time: 10/15/2021 3:48 PM Performed by: Reece Agar, CRNA Pre-anesthesia Checklist: Patient identified, Emergency Drugs available, Suction available and Patient being monitored Patient Re-evaluated:Patient Re-evaluated prior to induction Oxygen Delivery Method: Circle System Utilized Preoxygenation: Pre-oxygenation with 100% oxygen Induction Type: IV induction Ventilation: Mask ventilation without difficulty, Two handed mask ventilation required and Oral airway inserted - appropriate to patient size Laryngoscope Size: Glidescope and 4 Grade View: Grade I Tube type: Oral Tube size: 7.5 mm Number of attempts: 1 Airway Equipment and Method: Stylet, Oral airway and Video-laryngoscopy Placement Confirmation: ETT inserted through vocal cords under direct vision, positive ETCO2 and breath sounds checked- equal and bilateral Secured at: 23 cm Tube secured with: Tape Dental Injury: Teeth and Oropharynx as per pre-operative assessment  Difficulty Due To: Difficulty was anticipated and Difficult Airway- due to reduced neck mobility

## 2021-10-16 ENCOUNTER — Encounter (HOSPITAL_COMMUNITY): Payer: Self-pay | Admitting: Neurosurgery

## 2021-10-16 DIAGNOSIS — N1831 Chronic kidney disease, stage 3a: Secondary | ICD-10-CM | POA: Diagnosis present

## 2021-10-16 DIAGNOSIS — I808 Phlebitis and thrombophlebitis of other sites: Secondary | ICD-10-CM | POA: Diagnosis present

## 2021-10-16 DIAGNOSIS — M109 Gout, unspecified: Secondary | ICD-10-CM | POA: Diagnosis present

## 2021-10-16 DIAGNOSIS — I330 Acute and subacute infective endocarditis: Secondary | ICD-10-CM | POA: Diagnosis present

## 2021-10-16 DIAGNOSIS — I10 Essential (primary) hypertension: Secondary | ICD-10-CM | POA: Diagnosis not present

## 2021-10-16 DIAGNOSIS — D84821 Immunodeficiency due to drugs: Secondary | ICD-10-CM | POA: Diagnosis present

## 2021-10-16 DIAGNOSIS — R7881 Bacteremia: Secondary | ICD-10-CM | POA: Diagnosis present

## 2021-10-16 DIAGNOSIS — R7303 Prediabetes: Secondary | ICD-10-CM | POA: Diagnosis not present

## 2021-10-16 DIAGNOSIS — M4620 Osteomyelitis of vertebra, site unspecified: Secondary | ICD-10-CM | POA: Diagnosis present

## 2021-10-16 DIAGNOSIS — I2510 Atherosclerotic heart disease of native coronary artery without angina pectoris: Secondary | ICD-10-CM | POA: Diagnosis present

## 2021-10-16 DIAGNOSIS — T8141XA Infection following a procedure, superficial incisional surgical site, initial encounter: Secondary | ICD-10-CM | POA: Diagnosis present

## 2021-10-16 DIAGNOSIS — N179 Acute kidney failure, unspecified: Secondary | ICD-10-CM | POA: Diagnosis present

## 2021-10-16 DIAGNOSIS — K760 Fatty (change of) liver, not elsewhere classified: Secondary | ICD-10-CM | POA: Diagnosis present

## 2021-10-16 DIAGNOSIS — E785 Hyperlipidemia, unspecified: Secondary | ICD-10-CM | POA: Diagnosis present

## 2021-10-16 DIAGNOSIS — B9561 Methicillin susceptible Staphylococcus aureus infection as the cause of diseases classified elsewhere: Secondary | ICD-10-CM | POA: Diagnosis present

## 2021-10-16 DIAGNOSIS — Z6841 Body Mass Index (BMI) 40.0 and over, adult: Secondary | ICD-10-CM | POA: Diagnosis not present

## 2021-10-16 DIAGNOSIS — G4733 Obstructive sleep apnea (adult) (pediatric): Secondary | ICD-10-CM | POA: Diagnosis present

## 2021-10-16 DIAGNOSIS — H9190 Unspecified hearing loss, unspecified ear: Secondary | ICD-10-CM | POA: Diagnosis present

## 2021-10-16 DIAGNOSIS — M79601 Pain in right arm: Secondary | ICD-10-CM | POA: Diagnosis not present

## 2021-10-16 DIAGNOSIS — Z20822 Contact with and (suspected) exposure to covid-19: Secondary | ICD-10-CM | POA: Diagnosis present

## 2021-10-16 DIAGNOSIS — I96 Gangrene, not elsewhere classified: Secondary | ICD-10-CM | POA: Diagnosis present

## 2021-10-16 DIAGNOSIS — I129 Hypertensive chronic kidney disease with stage 1 through stage 4 chronic kidney disease, or unspecified chronic kidney disease: Secondary | ICD-10-CM | POA: Diagnosis present

## 2021-10-16 DIAGNOSIS — I252 Old myocardial infarction: Secondary | ICD-10-CM | POA: Diagnosis not present

## 2021-10-16 DIAGNOSIS — M459 Ankylosing spondylitis of unspecified sites in spine: Secondary | ICD-10-CM | POA: Diagnosis present

## 2021-10-16 DIAGNOSIS — M450 Ankylosing spondylitis of multiple sites in spine: Secondary | ICD-10-CM | POA: Diagnosis not present

## 2021-10-16 DIAGNOSIS — T8132XA Disruption of internal operation (surgical) wound, not elsewhere classified, initial encounter: Secondary | ICD-10-CM | POA: Diagnosis present

## 2021-10-16 DIAGNOSIS — F32A Depression, unspecified: Secondary | ICD-10-CM | POA: Diagnosis present

## 2021-10-16 DIAGNOSIS — D649 Anemia, unspecified: Secondary | ICD-10-CM | POA: Diagnosis present

## 2021-10-16 DIAGNOSIS — T8149XA Infection following a procedure, other surgical site, initial encounter: Secondary | ICD-10-CM | POA: Diagnosis present

## 2021-10-16 LAB — COMPREHENSIVE METABOLIC PANEL
ALT: 65 U/L — ABNORMAL HIGH (ref 0–44)
AST: 61 U/L — ABNORMAL HIGH (ref 15–41)
Albumin: 2.4 g/dL — ABNORMAL LOW (ref 3.5–5.0)
Alkaline Phosphatase: 118 U/L (ref 38–126)
Anion gap: 10 (ref 5–15)
BUN: 33 mg/dL — ABNORMAL HIGH (ref 8–23)
CO2: 23 mmol/L (ref 22–32)
Calcium: 8.4 mg/dL — ABNORMAL LOW (ref 8.9–10.3)
Chloride: 100 mmol/L (ref 98–111)
Creatinine, Ser: 1.68 mg/dL — ABNORMAL HIGH (ref 0.61–1.24)
GFR, Estimated: 45 mL/min — ABNORMAL LOW (ref >60)
Glucose, Bld: 164 mg/dL — ABNORMAL HIGH (ref 70–99)
Potassium: 4 mmol/L (ref 3.5–5.1)
Sodium: 133 mmol/L — ABNORMAL LOW (ref 135–145)
Total Bilirubin: 2 mg/dL — ABNORMAL HIGH (ref 0.3–1.2)
Total Protein: 6.5 g/dL (ref 6.5–8.1)

## 2021-10-16 LAB — CBC
HCT: 31.3 % — ABNORMAL LOW (ref 39.0–52.0)
Hemoglobin: 10.4 g/dL — ABNORMAL LOW (ref 13.0–17.0)
MCH: 30.9 pg (ref 26.0–34.0)
MCHC: 33.2 g/dL (ref 30.0–36.0)
MCV: 92.9 fL (ref 80.0–100.0)
Platelets: 216 10*3/uL (ref 150–400)
RBC: 3.37 MIL/uL — ABNORMAL LOW (ref 4.22–5.81)
RDW: 14 % (ref 11.5–15.5)
WBC: 6.6 10*3/uL (ref 4.0–10.5)
nRBC: 0 % (ref 0.0–0.2)

## 2021-10-16 LAB — HIV ANTIBODY (ROUTINE TESTING W REFLEX): HIV Screen 4th Generation wRfx: NONREACTIVE

## 2021-10-16 LAB — LIPID PANEL
Cholesterol: 70 mg/dL (ref 0–200)
HDL: 12 mg/dL — ABNORMAL LOW (ref >40)
LDL Cholesterol: 39 mg/dL (ref 0–99)
Total CHOL/HDL Ratio: 5.8 RATIO
Triglycerides: 94 mg/dL (ref <150)
VLDL: 19 mg/dL (ref 0–40)

## 2021-10-16 LAB — HEMOGLOBIN A1C
Hgb A1c MFr Bld: 5.8 % — ABNORMAL HIGH (ref 4.8–5.6)
Mean Plasma Glucose: 119.76 mg/dL

## 2021-10-16 MED ORDER — VITAMIN D3 25 MCG (1000 UNIT) PO TABS
2000.0000 [IU] | ORAL_TABLET | Freq: Every evening | ORAL | Status: DC
  Administered 2021-10-16 – 2021-10-19 (×4): 2000 [IU] via ORAL
  Filled 2021-10-16 (×9): qty 2

## 2021-10-16 MED ORDER — SODIUM CHLORIDE 0.9 % IV SOLN
2.0000 g | Freq: Three times a day (TID) | INTRAVENOUS | Status: DC
  Administered 2021-10-16 – 2021-10-17 (×3): 2 g via INTRAVENOUS
  Filled 2021-10-16 (×3): qty 2

## 2021-10-16 MED ORDER — VANCOMYCIN HCL 1500 MG/300ML IV SOLN
1500.0000 mg | INTRAVENOUS | Status: DC
  Administered 2021-10-17 – 2021-10-18 (×2): 1500 mg via INTRAVENOUS
  Filled 2021-10-16 (×2): qty 300

## 2021-10-16 MED ORDER — METOPROLOL TARTRATE 50 MG PO TABS
50.0000 mg | ORAL_TABLET | Freq: Every day | ORAL | Status: DC
  Administered 2021-10-16 – 2021-10-20 (×5): 50 mg via ORAL
  Filled 2021-10-16 (×5): qty 1

## 2021-10-16 NOTE — Progress Notes (Signed)
Subjective: Patient reports that he is pain free and doing well. No acute events overnight.   Objective: Vital signs in last 24 hours: Temp:  [97.7 F (36.5 C)-99 F (37.2 C)] 98.3 F (36.8 C) (01/29 0851) Pulse Rate:  [72-120] 81 (01/29 0851) Resp:  [14-29] 19 (01/29 0851) BP: (100-165)/(42-69) 125/68 (01/29 0851) SpO2:  [95 %-100 %] 97 % (01/29 0851) Weight:  [134.4 kg] 134.4 kg (01/28 1453)  Intake/Output from previous day: 01/28 0701 - 01/29 0700 In: 1000 [I.V.:1000] Out: 605 [Urine:600; Blood:5] Intake/Output this shift: Total I/O In: 120 [P.O.:120] Out: -   Physical Exam: Patient is awake, A/O X 4, conversant, and in good spirits. They are in NAD and VSS. Doing well. Speech is fluent and appropriate. MAEW with good strength that is symmetric bilaterally. 5/5 BUE/BLE. Sensation to light touch is intact. PERLA, EOMI. CNs grossly intact. Wound vac in place with good seal and sanguinous drainage.    Lab Results: Recent Labs    10/15/21 0850 10/16/21 0135  WBC 8.4 6.6  HGB 11.1* 10.4*  HCT 35.3* 31.3*  PLT 243 216   BMET Recent Labs    10/15/21 0850 10/16/21 0135  NA 133* 133*  K 3.6 4.0  CL 99 100  CO2 22 23  GLUCOSE 111* 164*  BUN 45* 33*  CREATININE 2.68* 1.68*  CALCIUM 8.4* 8.4*    Studies/Results: MR Cervical Spine Wo Contrast  Result Date: 10/15/2021 CLINICAL DATA:  Increased drainage at surgical site a posterior neck. Fluid collection seen on outside CT. Fever, leukocytosis. Elevated serum inflammatory markers. EXAM: MRI CERVICAL SPINE WITHOUT CONTRAST TECHNIQUE: Multiplanar, multisequence MR imaging of the cervical spine was performed. No intravenous contrast was administered. COMPARISON:  CT 10/14/2021 FINDINGS: Alignment: Straightening of cervical lordosis. Minimal anterolisthesis at C2-3. Vertebrae: Postsurgical changes from C3-C7 ACDF with associated metallic susceptibility artifact. Also changes of recent posterior spinal fusion at C5-C7. No acute  fracture. No evidence of discitis. No marrow replacing bone lesion identified. Cord: No focal cord signal abnormality is identified. No appreciable epidural fluid collection. Posterior Fossa, vertebral arteries, paraspinal tissues: Large fluid collection at the operative bed of the posterior lower cervical spine extending to the overlying skin surface along the surgical incision site. Collection measures approximately 9.0 x 8.0 x 8.0 cm. Multiple foci of susceptibility artifact within the collection likely a represent a combination of micrometallic postsurgical change and air. Collection does not extend into the lateral or anterior paraspinal soft tissues. Disc levels: C2-C3: Disc osteophyte complex with right worse than left facet arthropathy and uncovertebral spurring resulting in moderate-severe right foraminal stenosis and mild canal stenosis. C3-C4: Prior fusion. Mild canal stenosis predominantly related to intrinsic canal narrowing. No foraminal stenosis. C4-C5: Prior fusion. Mild canal stenosis predominantly related to intrinsic canal narrowing. No foraminal stenosis. C5-C6: Prior fusion. Mild canal stenosis predominantly related to intrinsic canal narrowing. Suspect mild bilateral foraminal stenosis, right slightly worse than left. C6-C7: Prior fusion. Mild canal stenosis predominantly related to intrinsic canal narrowing. Suspect mild bilateral foraminal stenosis, left slightly worse than right. C7-T1: No significant disc protrusion, foraminal stenosis, or canal stenosis. IMPRESSION: 1. Large fluid collection at the operative bed of the posterior lower cervical spine extending to the overlying skin surface along the surgical incision site. Collection measures approximately 9.0 x 8.0 x 8.0 cm. Multiple foci of susceptibility artifact within the collection likely represent a combination of micrometallic postsurgical change and air. Appearance most compatible with a postoperative seroma or hematoma.  Superimposed infection cannot be  differentiated by imaging alone. 2. No evidence of epidural fluid collection. 3. Cervical spondylosis with multifactorial mild canal stenosis at the C2-3 level predominantly related to intrinsic canal narrowing. There is also moderate-severe right foraminal stenosis at C2-3. Electronically Signed   By: Duanne Guess D.O.   On: 10/15/2021 12:44    Assessment/Plan: Patient is post-op day 1 s/p I&D of posterior cervical wound with placement of wound vac due to postop wound infection following C5-7 posterior instrumented fusion on 09/20/2021. He is recovering well and reports no pain. His neurological exam remains at his baseline.    -Encourage mobility -PT/OT -Continue wound vac  -ID consult   LOS: 0 days     Council Mechanic, DNP, NP-C 10/16/2021, 9:59 AM

## 2021-10-16 NOTE — Progress Notes (Signed)
PT Cancellation Note  Patient Details Name: Bill [name] MRN: [id number] DOB: October 28, 1955   Cancelled Treatment:    Reason Eval/Treat Not Completed: PT screened, no needs identified, will sign off OT saw; pt ambulating hallways and climbing stairs without physical assist.   Lillia Pauls, PT, DPT Acute Rehabilitation Services Pager [phone] Office [phone]    Norval Morton 10/16/2021, 10:59 AM

## 2021-10-16 NOTE — Progress Notes (Signed)
°  Date: 10/16/2021  Patient name: Bill [name]  Medical record number: [id number]  Date of birth: 10/22/1955   I have seen and evaluated Vicente Masson and discussed their care with the Residency Team.  In brief, patient is a 66 year old male with a past medical history of ankylosing spondylitis, CKD stage IIIa, hypertension, hyperlipidemia, CAD and recent posterior cervical fusion who presented to the ED with fevers and drainage from his surgical site.  Patient saw his neurosurgeon last Wednesday and was told that the surgical site looked well and that he was doing well.  Soon after that patient noted that he developed fevers up to 49 F also had some increased pain at the wound site.  On Friday, patient noted that he had some drainage from his wound and was evaluated at the [place].  Patient was told that he had an infection was started on some antibiotics.  He was supposed be transferred to [facility] with patient went back home first to get his affairs in order and get someone to look after his dog.  He returned later to [facility] and was admitted for further evaluation.  No chest pain, no shortness of breath, no palpitations, no lightheadedness, no syncope, no focal weakness, no tingling or numbness, no nausea or vomiting, no abdominal pain, no diarrhea.  Today, patient states that he is pain-free and feels well today.  PMHx, Fam Hx, and/or Soc Hx : As per resident admit note  Vitals:   10/16/21 0433 10/16/21 0851  BP: 126/62 125/68  Pulse: 75 81  Resp: 18 19  Temp: 98.2 F (36.8 C) 98.3 F (36.8 C)  SpO2: 97% 97%   General: Awake, alert, oriented x3, NAD CVS: Regular rate and rhythm, heart sounds Lungs: CTA bilaterally Abdomen: Soft, nontender, nondistended, normoactive bowel sounds Extremities: No edema noted, nontender to palpation Psych: Normal mood and affect HEENT: Normocephalic, atraumatic Skin: Wound VAC in place over upper back, surgical site looks clean with  no discharge noted, mild tenderness noted over surgical site   Assessment and Plan: I have seen and evaluated the patient as outlined above. I agree with the formulated Assessment and Plan as detailed in the residents' note, with the following changes:   1.  Postop wound infection: -Patient had recent posterior cervical fusion on January 3 and this week noted increased pain, drainage from the surgical site with fevers and was diagnosed with a postop wound infection.  MRI done here showed large fluid collection at the surgical site measuring 9 x 8 x 8 cm. -Patient was taken to the OR yesterday by neurosurgery and was found to have significant purulence, fascial dehiscence and necrosis at the surgical site.  Patient underwent an I&D by neurosurgery with placement of wound VAC. -Wound cultures are pending but gram stain showed gram-positive cocci -We will continue with cefepime and vancomycin for now.  May be possible to narrow to vancomycin alone -We will obtain ID consult for further recommendations regarding antibiotics -Patient with no leukocytosis or fevers today.  We will continue to monitor closely -We will follow-up blood cultures -Continue pain control -Of note, patient did have an AKI on admission with creatinine up to 2.68 likely secondary to his underlying infection.  Patient's creatinine has improved to 1.68 today.  We will continue to monitor closely -No further work-up at this time.  Aldine Contes, MD 1/29/202310:52 AM

## 2021-10-16 NOTE — Consult Note (Signed)
[facility]    Date of Admission:  10/15/2021   Total days of inpatient antibiotics 1        Reason for Consult: Post-op spinal wound    Principal Problem:   Postoperative wound infection Active Problems:   Ankylosing spondylitis (HCC)   Hypertension   Hyperlipidemia   Chronic renal insufficiency   Coronary artery disease   OSA (obstructive sleep apnea)   Paraspinal abscess Bill [name])   Assessment: 66 year old male with recent cervical fusion on 09/20/21 admitted for postop wound. Taken to the OR on 1/28 for debridement. #Postop cervical wound with retained hardware SP I&D on 1/28 with Cx pending - He reports starting 1/26 he had drainage form wound and fevers which led him to present to the [place]. Pt [facility] and pt present ed to [facility] for further management.  - Patient received 1 dose of vancomycin and cefepime prior to the OR at [facility]. Taken to OR with Dr. Christiane Ha Thomas(Neurosurgery) and noted significant fascial dehiscence and necrosis around incision site. - Gram stain of or cultures are showing GPC's as such would continue broad-spectrum antibiotics.  I anticipate he will need at least 8 weeks of IV therapy followed by suppressive antibiotics as hardware is retained. Recommendations:  -Follow blood Cx -Follow OR Cx from 1/28 -Continue vancomycin and cefepime Microbiology:   Antibiotics: 1/27-p vancomycin 1/28-p cefepime Cultures: Blood 1/28   OR Cx 1/28   HPI: Bill [name] is a 66 y.o. male with ankylosing spondylitis, CKD, hypertension, hyperlipidemia, CAD, posterior cervical fusion on 09/20/2021 admitted from the [place] for drainage from surgical site.  Patient reported drainage started on Wednesday.  He had associated fevers which led to presentation at the [place].  Purulent drainage noted on exam at the [facility], patient received 1 dose of vancomycin and plaed transferred to [facility] for further  evaluation.  Patient went home to get his affairs in order and presented to [facility] on 1/28.  On arrival to [facility], pt started on vancomycin and cefepime on 1/28(received one dose of cefepime).  Dr. Hoyt Koch took patient to the OR on 1/28 for debridement of posterior cervical wound and placement of wound VAC. OR note mentioned significant fascial dehiscence and necrosis around incision site.  He was continued on vancomycin and cefepime ID engaged for antibiotic recommendations. Review of Systems: ROS  Past Medical History:  Diagnosis Date   ADHD (attention deficit hyperactivity disorder)    Anemia    Ankylosing spondylitis (HCC)    Chronic kidney disease    Coronary artery disease    Depression    Gout    Hearing loss    Hyperlipidemia    Hypertension    Morbid obesity with BMI of 40.0-44.9, adult (HCC)    Myocardial infarction (HCC)    OSA treated with BiPAP    Restrictive lung disease    Spondylarthritis     Social History   Tobacco Use   Smoking status: Never   Smokeless tobacco: Never  Vaping Use   Vaping Use: Never used  Substance Use Topics   Alcohol use: Not Currently    Comment:  03/11/2015 "I might have a drink once/wk"   Drug use: No    History reviewed. No pertinent family history. Scheduled Meds:  allopurinol  300 mg Oral Daily   [START ON 10/17/2021] aspirin EC  81 mg Oral Daily   atorvastatin  20 mg Oral QHS   cholecalciferol  2,000 Units Oral QPM   docusate sodium  100 mg Oral BID   enoxaparin (LOVENOX) injection  40 mg Subcutaneous Q24H   folic acid  1 mg Oral QPM   loratadine  10 mg Oral Daily   metoprolol tartrate  50 mg Oral Daily   montelukast  10 mg Oral QHS   sodium chloride flush  3 mL Intravenous Q12H   Continuous Infusions:  sodium chloride     ceFEPime (MAXIPIME) IV     methocarbamol (ROBAXIN) IV     [START ON 10/17/2021] vancomycin     PRN Meds:.acetaminophen **OR** acetaminophen, HYDROmorphone (DILAUDID) injection,  menthol-cetylpyridinium **OR** phenol, methocarbamol **OR** methocarbamol (ROBAXIN) IV, ondansetron **OR** ondansetron (ZOFRAN) IV, oxyCODONE, oxyCODONE, polyethylene glycol, senna-docusate, sodium chloride, sodium chloride flush, sodium phosphate Allergies  Allergen Reactions   Amlodipine Swelling   Duloxetine     Other reaction(s): MALE ERECTILE DISORDER    OBJECTIVE: Blood pressure 125/68, pulse 81, temperature 98.3 F (36.8 C), temperature source Oral, resp. rate 19, height 5\' 10"  (1.778 m), weight 134.4 kg, SpO2 97 %.  Physical Exam Constitutional:      General: He is not in acute distress.    Appearance: He is normal weight. He is not toxic-appearing.  HENT:     Head: Normocephalic and atraumatic.     Right Ear: External ear normal.     Left Ear: External ear normal.     Nose: No congestion or rhinorrhea.     Mouth/Throat:     Mouth: Mucous membranes are moist.     Pharynx: Oropharynx is clear.  Eyes:     Extraocular Movements: Extraocular movements intact.     Conjunctiva/sclera: Conjunctivae normal.     Pupils: Pupils are equal, round, and reactive to light.  Cardiovascular:     Rate and Rhythm: Normal rate and regular rhythm.     Heart sounds: No murmur heard.   No friction rub. No gallop.  Pulmonary:     Effort: Pulmonary effort is normal.     Breath sounds: Normal breath sounds.  Abdominal:     General: Abdomen is flat. Bowel sounds are normal.     Palpations: Abdomen is soft.  Musculoskeletal:        General: No swelling.     Cervical back: Normal range of motion and neck supple.     Comments: Cervical wound vac  Skin:    General: Skin is warm and dry.  Neurological:     General: No focal deficit present.     Mental Status: He is oriented to person, place, and time.  Psychiatric:        Mood and Affect: Mood normal.    Lab Results Lab Results  Component Value Date   WBC 6.6 10/16/2021   HGB 10.4 (L) 10/16/2021   HCT 31.3 (L) 10/16/2021   MCV 92.9  10/16/2021   PLT 216 10/16/2021    Lab Results  Component Value Date   CREATININE 1.68 (H) 10/16/2021   BUN 33 (H) 10/16/2021   NA 133 (L) 10/16/2021   K 4.0 10/16/2021   CL 100 10/16/2021   CO2 23 10/16/2021    Lab Results  Component Value Date   ALT 65 (H) 10/16/2021   AST 61 (H) 10/16/2021   ALKPHOS 118 10/16/2021   BILITOT 2.0 (H) 10/16/2021       10/18/2021, MD [facility] 10/16/2021, 3:03 PM

## 2021-10-16 NOTE — Progress Notes (Signed)
Patient received in bed sleeping at this time. Wound vac working well noted

## 2021-10-16 NOTE — Evaluation (Signed)
Occupational Therapy Evaluation Patient Details Name: Bill [name] MRN: [id number] DOB: [birth year] Today's Date: 10/16/2021   History of Present Illness pt is a 66 y/o M presenting to ED on 1/28 for wound infection after having C5-7 fusion on jan 3rd, s/p I &D of posterior cervical wound with placement of wound vac. PMH includes ADHD, anemia, CAD, CKD, HTN, hyperlipidemia, and ACDF 11/2020.   Clinical Impression   Pt independent at baseline with ADLs and functional mobility, plans to stay at friends house at d/c. Pt currently independent-supervision level for ADLs, mod I for bed mobility with use of bed rails and log rolling technique. Supervision for transfers and in-room/hallway distance ambulation to manage wound vac. Pt able to ascend/descend a flight of stairs with use of railing, x1 LOB episode but able to self-correct. Pt presenting with impairments listed below, however has no acute OT needs at this time, will s/o. Recommend d/c home with assistance.     Recommendations for follow up therapy are one component of a multi-disciplinary discharge planning process, led by the attending physician.  Recommendations may be updated based on patient status, additional functional criteria and insurance authorization.   Follow Up Recommendations  No OT follow up    Assistance Recommended at Discharge PRN  Patient can return home with the following Assist for transportation;Assistance with cooking/housework    Functional Status Assessment  Patient has had a recent decline in their functional status and demonstrates the ability to make significant improvements in function in a reasonable and predictable amount of time.  Equipment Recommendations  Other (comment);None recommended by OT    Recommendations for Other Services       Precautions / Restrictions Precautions Precautions: Cervical Precaution Comments: wound vac Required Braces or Orthoses:  (no brace  needed) Restrictions Weight Bearing Restrictions: No      Mobility Bed Mobility Overal bed mobility: Modified Independent             General bed mobility comments: used log rolling technique and use of bedrail to sit EOB    Transfers Overall transfer level: Needs assistance Equipment used: None Transfers: Sit to/from Stand Sit to Stand: Supervision           General transfer comment: supervision level for wound vac      Balance Overall balance assessment: Mild deficits observed, not formally tested                                         ADL either performed or assessed with clinical judgement   ADL Overall ADL's : Needs assistance/impaired Eating/Feeding: Independent;Sitting   Grooming: Independent;Sitting   Upper Body Bathing: Supervision/ safety;Sitting   Lower Body Bathing: Minimal assistance;Sitting/lateral leans   Upper Body Dressing : Supervision/safety;Sitting   Lower Body Dressing: Minimal assistance;Sit to/from stand Lower Body Dressing Details (indicate cue type and reason): pulls  up sock in standing position Toilet Transfer: Supervision/safety;Ambulation;Regular Teacher, adult education Details (indicate cue type and reason): completed in standing Toileting- Clothing Manipulation and Hygiene: Supervision/safety;Sit to/from stand Toileting - Clothing Manipulation Details (indicate cue type and reason): able to manage clothing while toileting in standing             Vision   Vision Assessment?: No apparent visual deficits     Perception     Praxis      Pertinent Vitals/Pain Pain Assessment Pain Assessment: Faces Pain Score: 2  Faces Pain Scale: Hurts a little bit Pain Location: incison site Pain Descriptors / Indicators: Discomfort Pain Intervention(s): Limited activity within patient's tolerance, Monitored during session, Premedicated before session     Hand Dominance     Extremity/Trunk Assessment Upper  Extremity Assessment Upper Extremity Assessment: Overall WFL for tasks assessed   Lower Extremity Assessment Lower Extremity Assessment: Overall WFL for tasks assessed   Cervical / Trunk Assessment Cervical / Trunk Assessment: Normal   Communication Communication Communication: No difficulties   Cognition Arousal/Alertness: Awake/alert Behavior During Therapy: Impulsive Overall Cognitive Status: Within Functional Limits for tasks assessed                                       General Comments  SpO2 97% after hallway ambulation on RA    Exercises     Shoulder Instructions      Home Living Family/patient expects to be discharged to:: Private residence Living Arrangements: Alone Available Help at Discharge: Friend(s);Available PRN/intermittently Type of Home: House Home Access: Stairs to enter [employer] of Steps: 3 Entrance Stairs-Rails: Right;Left Home Layout: One level     Bathroom Shower/Tub: IT trainer: Handicapped height Bathroom Accessibility: No   Home Equipment: None   Additional Comments: has bidet, plans to d/c to friends house with walk in shower      Prior Functioning/Environment Prior Level of Function : Independent/Modified Independent;Driving;Working/employed             Mobility Comments: no AD 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[employer], mining Problem List: Impaired balance (sitting and/or standing);Decreased activity tolerance      OT Treatment/Interventions:      OT Goals(Current goals can be found in the care plan section) Acute Rehab OT Goals Patient Stated Goal: to go home OT Goal Formulation: With patient Time For Goal Achievement: 10/30/21 Potential to Achieve Goals: Good  OT Frequency:      Co-evaluation              AM-PAC OT "6 Clicks" Daily Activity     Outcome Measure Help from another person eating meals?: None Help from another person taking care of  personal grooming?: None Help from another person toileting, which includes using toliet, bedpan, or urinal?: None Help from another person bathing (including washing, rinsing, drying)?: A Little Help from another person to put on and taking off regular upper body clothing?: A Little Help from another person to put on and taking off regular lower body clothing?: None 6 Click Score: 22   End of Session Nurse Communication: Mobility status  Activity Tolerance: Patient tolerated treatment well Patient left: in bed;with call bell/phone within reach  OT Visit Diagnosis: Unsteadiness on feet (R26.81);Other abnormalities of gait and mobility (R26.89);Muscle weakness (generalized) (M62.81)                Time: 1000-1018 OT Time Calculation (min): 18 min Charges:  OT General Charges $OT Visit: 1 Visit OT Evaluation $OT Eval Low Complexity: 1 Low  Alfonzo Beers, OTD, OTR/L Acute Rehab [phone]   Mayer Masker 10/16/2021, 10:40 AM

## 2021-10-16 NOTE — Progress Notes (Signed)
HD#0 Subjective:  Overnight Events: NAEO   Patient says he felt significantly better as soon as he had the wound drained. His biggest complaint today is being tied up to the telemetry wires and the wound vac. Wishes he had more ability to move around the room. Pain is well controlled.   Objective:  Vital signs in last 24 hours: Vitals:   10/15/21 1803 10/15/21 2032 10/16/21 0432 10/16/21 0433  BP: (!) 109/54 129/69 126/62 126/62  Pulse: 81 83 72 75  Resp: 18  18 18   Temp: 98.4 F (36.9 C) 97.8 F (36.6 C) 98.2 F (36.8 C) 98.2 F (36.8 C)  TempSrc: Oral Oral Oral Oral  SpO2: 100% 97% 100% 97%  Weight:      Height:       Supplemental O2: Room Air SpO2: 97 % O2 Flow Rate (L/min): 5 L/min   Physical Exam:  Constitutional: well-appearing obese gentleman sitting upright in bed, in no acute distress HENT: normocephalic atraumatic, mucous membranes moist Eyes: conjunctiva non-erythematous Neck: supple Cardiovascular: regular rate and rhythm, no m/r/g Pulmonary/Chest: normal work of breathing on room air, lungs clear to auscultation bilaterally Abdominal: soft, non-tender, distended MSK: normal bulk and tone Neurological: alert & oriented x 3 Skin: wound vac in place, skin at the back of his neck less swollen and less erythematous than yesterday. Mild tenderness present Psych: normal affect  Filed Weights   10/15/21 1453  Weight: 134.4 kg     Intake/Output Summary (Last 24 hours) at 10/16/2021 0639 Last data filed at 10/15/2021 2200 Gross per 24 hour  Intake 1000 ml  Output 605 ml  Net 395 ml   Net IO Since Admission: 395 mL [10/16/21 0639]  Pertinent Labs: CBC Latest Ref Rng & Units 10/16/2021 10/15/2021 09/16/2021  WBC 4.0 - 10.5 K/uL 6.6 8.4 14.3(H)  Hemoglobin 13.0 - 17.0 g/dL 10.4(L) 11.1(L) 13.6  Hematocrit 39.0 - 52.0 % 31.3(L) 35.3(L) 42.4  Platelets 150 - 400 K/uL 216 243 363    CMP Latest Ref Rng & Units 10/16/2021 10/15/2021 09/16/2021  Glucose 70 -  99 mg/dL 164(H) 111(H) 129(H)  BUN 8 - 23 mg/dL 33(H) 45(H) 27(H)  Creatinine 0.61 - 1.24 mg/dL 1.68(H) 2.68(H) 1.57(H)  Sodium 135 - 145 mmol/L 133(L) 133(L) 134(L)  Potassium 3.5 - 5.1 mmol/L 4.0 3.6 4.6  Chloride 98 - 111 mmol/L 100 99 100  CO2 22 - 32 mmol/L 23 22 28   Calcium 8.9 - 10.3 mg/dL 8.4(L) 8.4(L) 9.3  Total Protein 6.5 - 8.1 g/dL 6.5 7.0 -  Total Bilirubin 0.3 - 1.2 mg/dL 2.0(H) 2.7(H) -  Alkaline Phos 38 - 126 U/L 118 118 -  AST 15 - 41 U/L 61(H) 94(H) -  ALT 0 - 44 U/L 65(H) 83(H) -    Imaging: MR Cervical Spine Wo Contrast  Result Date: 10/15/2021 CLINICAL DATA:  Increased drainage at surgical site a posterior neck. Fluid collection seen on outside CT. Fever, leukocytosis. Elevated serum inflammatory markers. EXAM: MRI CERVICAL SPINE WITHOUT CONTRAST TECHNIQUE: Multiplanar, multisequence MR imaging of the cervical spine was performed. No intravenous contrast was administered. COMPARISON:  CT 10/14/2021 FINDINGS: Alignment: Straightening of cervical lordosis. Minimal anterolisthesis at C2-3. Vertebrae: Postsurgical changes from C3-C7 ACDF with associated metallic susceptibility artifact. Also changes of recent posterior spinal fusion at C5-C7. No acute fracture. No evidence of discitis. No marrow replacing bone lesion identified. Cord: No focal cord signal abnormality is identified. No appreciable epidural fluid collection. Posterior Fossa, vertebral arteries, paraspinal tissues: Large  fluid collection at the operative bed of the posterior lower cervical spine extending to the overlying skin surface along the surgical incision site. Collection measures approximately 9.0 x 8.0 x 8.0 cm. Multiple foci of susceptibility artifact within the collection likely a represent a combination of micrometallic postsurgical change and air. Collection does not extend into the lateral or anterior paraspinal soft tissues. Disc levels: C2-C3: Disc osteophyte complex with right worse than left facet  arthropathy and uncovertebral spurring resulting in moderate-severe right foraminal stenosis and mild canal stenosis. C3-C4: Prior fusion. Mild canal stenosis predominantly related to intrinsic canal narrowing. No foraminal stenosis. C4-C5: Prior fusion. Mild canal stenosis predominantly related to intrinsic canal narrowing. No foraminal stenosis. C5-C6: Prior fusion. Mild canal stenosis predominantly related to intrinsic canal narrowing. Suspect mild bilateral foraminal stenosis, right slightly worse than left. C6-C7: Prior fusion. Mild canal stenosis predominantly related to intrinsic canal narrowing. Suspect mild bilateral foraminal stenosis, left slightly worse than right. C7-T1: No significant disc protrusion, foraminal stenosis, or canal stenosis. IMPRESSION: 1. Large fluid collection at the operative bed of the posterior lower cervical spine extending to the overlying skin surface along the surgical incision site. Collection measures approximately 9.0 x 8.0 x 8.0 cm. Multiple foci of susceptibility artifact within the collection likely represent a combination of micrometallic postsurgical change and air. Appearance most compatible with a postoperative seroma or hematoma. Superimposed infection cannot be differentiated by imaging alone. 2. No evidence of epidural fluid collection. 3. Cervical spondylosis with multifactorial mild canal stenosis at the C2-3 level predominantly related to intrinsic canal narrowing. There is also moderate-severe right foraminal stenosis at C2-3. Electronically Signed   By: Davina Poke D.O.   On: 10/15/2021 12:44    Assessment/Plan:   Principal Problem:   Postoperative wound infection Active Problems:   Ankylosing spondylitis (HCC)   Hypertension   Hyperlipidemia   Chronic renal insufficiency   Coronary artery disease   OSA (obstructive sleep apnea)   Patient Summary: Bill [name] is a 66 y.o.  with pertinent PMH of ankylosing spondylitis, CKD, HTN, HLD,  CAD, and posterior cervical fusion who presented with fever and purulent wound drainage admitted for postoperative wound infection.   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 Patient went for I/D in the OR yesterday. Op note details significant amount of purulence, fascial dehiscence, and necrosis.Primary closure was not possible and wound VAC was placed. Gram stain of wound  culture notable for GP cocci. Will continue treatment with broad spectrum antibiotics but can likely remove cefepime in the coming days. Patient is afebrile with a normal white count and overall non-toxic appearing. - neurosurgery following, appreciate assistance - vanc per pharmacy, cefepime - f/u blood cultures - daily CBC - oxy 5 Q4 PRN   #Ankylosing spondylitis Has not had both of his medications for a week. Will hold for now in the setting of acute infection. - holding etanercept and methotrexate - continue folic acid   #AKI on CKD Creatinine has improved to 1.68 from 2.68. Will continue to encourage good PO intake. - avoid nephrotoxic medications - daily BMP   #Elevated liver enzymes; improving Possible history of fatty liver on care everywhere documents.  - daily cmp  #Prediabetes A1c 5.8. -dietary counseling   #HTN Takes lisinopril HCTZ at home as well as metoprolol - holding BP meds   #HLD -continue atorvastatin   #OSA -CPAP QHS   #CAD #Hx of  NSTEMI in 2016 Had stress tesitng in 2019 which showed no reversible ischemia. EF at that time was 55-60% and showed grade 1 diastolic dysfunction. - holding aspirin - holding metoprolol    #Anemia Hemoglobin did drop slightly to 10.4 today from 11.1 on admission. Hemoglobin was 13.6 one month ago. Some blood loss expected after surgery/with wound vac in place however do not suspect patient's hemoglobin will continue to drop. - daily cbc    #Gout -continue allopurinol 0.6 daily  Diet: Normal IVF: None,None VTE: Enoxaparin Code: Full  Scarlett Presto, MD Internal Medicine Resident PGY-1 Pager [phone] Please contact the on call pager after 5 pm and on weekends at [phone].

## 2021-10-16 NOTE — Consult Note (Signed)
WOC Nurse Consult Note: Reason for Consult: NPWT dressing changes. NPWT dressing was placed to the posterior cervical wound in OR on 10/15/21 by Dr. Maisie Fus. Wound type:Surgical Pressure Injury POA:N/A  WOC Nursing team is in receipt of consult for NPWT dressing changes.  It should be noted that Monday's change (10/17/21) is the first surgical dressing change and a Provider must be present for that change.  Please coordinate with a WOC nurse on this campus [facility] nursing is on AMION) for the time most convenient for the Provider between 7am and 3pm).  Alternatively, the dressing can be removed by the Provider and WOC nursing paged to replace.  WOC nursing team will follow, and will remain available to this patient, the nursing, surgical and medical teams.   Thanks, Ladona Mow, MSN, RN, GNP, [place]  Pager# [phone]

## 2021-10-16 NOTE — Progress Notes (Signed)
PHARMACY NOTE:  ANTIMICROBIAL RENAL DOSAGE ADJUSTMENT  Current antimicrobial regimen includes a mismatch between antimicrobial dosage and estimated renal function.  As per policy approved by the Pharmacy & Therapeutics and Medical Executive Committees, the antimicrobial dosage will be adjusted accordingly.  Current antimicrobial dosage:  1500 MG IV Q36H (Based on Scr 2.68)  Indication: Wound infection wit abscess  Renal Function:  Estimated Creatinine Clearance: 62.1 mL/min (A) (by C-G formula based on SCr of 1.68 mg/dL (H)). []      On intermittent HD, scheduled: []      On CRRT    Antimicrobial dosage has been changed to:  1500 MG IV Q24H (eAUC 521, Scr 1.68)   Additional comments: Patient's renal function remains variable but improved significantly overnight. Will continue to monitor any changes to adjust vancomycin appropriately.   Thank you for allowing pharmacy to be a part of this patient's care.  , PharmD, MBA Pharmacy Resident [phone] 10/16/2021 8:43 AM

## 2021-10-17 ENCOUNTER — Inpatient Hospital Stay: Payer: Self-pay

## 2021-10-17 ENCOUNTER — Other Ambulatory Visit: Payer: Self-pay

## 2021-10-17 DIAGNOSIS — I10 Essential (primary) hypertension: Secondary | ICD-10-CM | POA: Diagnosis not present

## 2021-10-17 DIAGNOSIS — R7303 Prediabetes: Secondary | ICD-10-CM

## 2021-10-17 DIAGNOSIS — G4733 Obstructive sleep apnea (adult) (pediatric): Secondary | ICD-10-CM

## 2021-10-17 DIAGNOSIS — E785 Hyperlipidemia, unspecified: Secondary | ICD-10-CM

## 2021-10-17 DIAGNOSIS — T8149XA Infection following a procedure, other surgical site, initial encounter: Secondary | ICD-10-CM | POA: Diagnosis not present

## 2021-10-17 DIAGNOSIS — M459 Ankylosing spondylitis of unspecified sites in spine: Secondary | ICD-10-CM | POA: Diagnosis not present

## 2021-10-17 LAB — CBC
HCT: 30.9 % — ABNORMAL LOW (ref 39.0–52.0)
Hemoglobin: 10 g/dL — ABNORMAL LOW (ref 13.0–17.0)
MCH: 30.3 pg (ref 26.0–34.0)
MCHC: 32.4 g/dL (ref 30.0–36.0)
MCV: 93.6 fL (ref 80.0–100.0)
Platelets: 229 10*3/uL (ref 150–400)
RBC: 3.3 MIL/uL — ABNORMAL LOW (ref 4.22–5.81)
RDW: 14 % (ref 11.5–15.5)
WBC: 13.8 10*3/uL — ABNORMAL HIGH (ref 4.0–10.5)
nRBC: 0 % (ref 0.0–0.2)

## 2021-10-17 LAB — COMPREHENSIVE METABOLIC PANEL
ALT: 42 U/L (ref 0–44)
AST: 32 U/L (ref 15–41)
Albumin: 2.3 g/dL — ABNORMAL LOW (ref 3.5–5.0)
Alkaline Phosphatase: 107 U/L (ref 38–126)
Anion gap: 8 (ref 5–15)
BUN: 34 mg/dL — ABNORMAL HIGH (ref 8–23)
CO2: 25 mmol/L (ref 22–32)
Calcium: 8.7 mg/dL — ABNORMAL LOW (ref 8.9–10.3)
Chloride: 104 mmol/L (ref 98–111)
Creatinine, Ser: 1.41 mg/dL — ABNORMAL HIGH (ref 0.61–1.24)
GFR, Estimated: 56 mL/min — ABNORMAL LOW (ref >60)
Glucose, Bld: 134 mg/dL — ABNORMAL HIGH (ref 70–99)
Potassium: 4.5 mmol/L (ref 3.5–5.1)
Sodium: 137 mmol/L (ref 135–145)
Total Bilirubin: 0.8 mg/dL (ref 0.3–1.2)
Total Protein: 6.4 g/dL — ABNORMAL LOW (ref 6.5–8.1)

## 2021-10-17 MED ORDER — SENNOSIDES-DOCUSATE SODIUM 8.6-50 MG PO TABS
1.0000 | ORAL_TABLET | Freq: Every day | ORAL | Status: DC
  Filled 2021-10-17 (×2): qty 1

## 2021-10-17 MED ORDER — LISINOPRIL 20 MG PO TABS
20.0000 mg | ORAL_TABLET | Freq: Every day | ORAL | Status: DC
  Administered 2021-10-17 – 2021-10-20 (×4): 20 mg via ORAL
  Filled 2021-10-17 (×4): qty 1

## 2021-10-17 MED ORDER — CHLORHEXIDINE GLUCONATE CLOTH 2 % EX PADS
6.0000 | MEDICATED_PAD | Freq: Every day | CUTANEOUS | Status: DC
  Administered 2021-10-17 – 2021-10-20 (×4): 6 via TOPICAL

## 2021-10-17 MED ORDER — HYDROCHLOROTHIAZIDE 12.5 MG PO TABS
12.5000 mg | ORAL_TABLET | Freq: Every day | ORAL | Status: DC
  Administered 2021-10-17 – 2021-10-20 (×4): 12.5 mg via ORAL
  Filled 2021-10-17 (×4): qty 1

## 2021-10-17 MED ORDER — SODIUM CHLORIDE 0.9% FLUSH: 10.0000 mL | INTRAVENOUS | Status: DC | PRN

## 2021-10-17 MED ORDER — RIFAMPIN 300 MG PO CAPS
300.0000 mg | ORAL_CAPSULE | Freq: Two times a day (BID) | ORAL | Status: DC
  Administered 2021-10-17 – 2021-10-20 (×7): 300 mg via ORAL
  Filled 2021-10-17 (×8): qty 1

## 2021-10-17 NOTE — Progress Notes (Signed)
HD#1 SUBJECTIVE:  Patient Summary: Bill [name] is a 66 y.o. with a pertinent PMH of ankylosing spondylitis, CKD stage IIIa, hypertension, hyperlipidemia, CAD and recent posterior cervical fusion who presented to the ED with fevers and drainage from his surgical siteand admitted for postop wound infection.   Overnight Events: None  Interim History: Pain level is a 0 today and he has not taken anything for pain since Saturday. He states that he is getting up and mobile, only limited by his drain. Able to pee with the bedside urinal. No bowel movement since last Wednesday (usually goes 3-5 days after anesthesia). Occasional bloating and feeling gassy. Overall doing well. No acute concerns today.   OBJECTIVE:  Vital Signs: Vitals:   10/16/21 1619 10/16/21 1954 10/17/21 0429 10/17/21 0738  BP: (!) 141/73 (!) 156/69 (!) 146/67 (!) 148/57  Pulse: 81 90 74 79  Resp: 19 19 18    Temp: 98 F (36.7 C) 98.2 F (36.8 C) 97.9 F (36.6 C) 98 F (36.7 C)  TempSrc: Oral Oral Oral Oral  SpO2: 97% 98% 98% 99%  Weight:      Height:       Supplemental O2: Room Air SpO2: 99 % O2 Flow Rate (L/min): 5 L/min  Filed Weights   10/15/21 1453  Weight: 134.4 kg     Intake/Output Summary (Last 24 hours) at 10/17/2021 0814 Last data filed at 10/17/2021 0656 Gross per 24 hour  Intake 440 ml  Output 500 ml  Net -60 ml   Net IO Since Admission: 335 mL [10/17/21 0814]  Physical Exam: Constitutional: No acute distress. Cardio: Regular rate and rhythm.  No murmurs, rubs, gallops. Pulm: Clear to auscultation bilaterally.  Normal work of breathing on room air. Abdomen: Soft, nontender, nondistended. MSK: Negative for extremity edema. Skin: Skin is warm and dry. Wound VAC in place over upper back, was changed this morning. Neuro: Alert and oriented x3.  No focal deficit noted. Psych: Normal mood and affect.  Patient Lines/Drains/Airways Status     Active Line/Drains/Airways     Name  Placement date Placement time Site Days   Peripheral IV 10/15/21 20 G Anterior;Left;Proximal Forearm 10/15/21  0914  Forearm  2   Negative Pressure Wound Therapy Cervical Posterior 10/15/21  1632  --  2   Incision (Closed) 03/11/15 Abdomen Other (Comment) 03/11/15  1230  -- 2412   Incision (Closed) 12/09/20 Cervical 12/09/20  1033  -- 312   Incision (Closed) 09/20/21 Neck 09/20/21  1338  -- 27   Incision (Closed) 10/15/21 Cervical 10/15/21  1648  -- 2             ASSESSMENT/PLAN:  Assessment: Principal Problem:   Postoperative wound infection Active Problems:   Ankylosing spondylitis (HCC)   Hypertension   Hyperlipidemia   Chronic renal insufficiency   Coronary artery disease   OSA (obstructive sleep apnea)   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  Wound VAC was placed at that time, most recently changed this morning.  Wound culture positive for staph aureus.  Patient has continued to be afebrile though did have a leukocytosis of 13.8 today. -Neurosurgery following, appreciate their assistance  -From their standpoint he is stable for discharge after PICC placement and adjustment of antibiotics given recent wound culture result -Infectious disease following, appreciate their assistance  -Continue vancomycin, cefepime -Blood cultures show no growth to date.  Continue to  follow. -Trend CBC -Oxycodone 5 mg every 4 hours as needed   #Ankylosing spondylitis Currently holding etanercept and methotrexate in the setting of acute infection. -Continue folic acid   #AKI on CKD Creatinine continues to improve with most recent value of 1.41. -Encourage p.o. intake -Avoid nephrotoxic agents -Trend BMP   #Elevated liver enzymes; resolved Patient noted to have possible history of fatty liver on chart review.  Liver enzymes within normal limits at this  time. -Trend CMP   #Prediabetes A1c 5.8. -Dietary counseling   #HTN Patient has been hypertensive over the last 24 hours.  Currently holding home lisinopril-HCTZ.  Metoprolol has been restarted during this admission. -Resume home lisinopril-HCTZ -Continue metoprolol   #HLD -Continue atorvastatin   #OSA -CPAP QHS  Best Practice: Diet: Regular diet IVF: Fluids: IV push only, no IV fluids VTE: enoxaparin (LOVENOX) injection 40 mg Start: 10/16/21 0800 SCD's Start: 10/15/21 1945 Code: Full  Signature: Farrel Gordon, D.O.  Internal Medicine Resident, PGY-1 [facility]  Pager: [phone] 8:14 AM, 10/17/2021   Please contact the on call pager after 5 pm and on weekends at [phone].

## 2021-10-17 NOTE — TOC Initial Note (Addendum)
Transition of Care [facility]) - Initial/Assessment Note    Patient Details  Name: Bill [name] MRN: [id number] Date of Birth: [birth year]  Transition of Care [facility]) CM/SW Contact:    Bill Plan, RN Phone Number: 10/17/2021, 10:49 AM  Clinical Narrative:                  Patient from home alone. Confirmed face sheet information.   Faxed home [employer] application to [place] with [employer].   Called [facility] left message with VA social worker awaiting call back.   VA PCP DR Benjiman Core fax [phone], clinicals faxed.   Pam with [facility] following .   Cory with [facility] checking to see if he can accept referral.   Patient aware of all of above.   Patient aware he will be shown how to give IV ABX at home prior to discharge. He will have a [facility] Monday Wednesday Friday for [facility] dressing changes.   Prior to discharge home VAC will be delivered to patient's bedside. Bedside nurse will connect home VAC to patient prior to discharge. Also dressing supplies will be delivered to hospital room for patient to take home for home health RN.   Awaiting [facility] authorization.   PAtient aware and voiced understanding   1244 received email from [place] to contact Ms Anabel Halon at ext [phone] , left Ms Anabel Halon second message  1300 Ms Anabel Halon returned call. She will notify PCP at [facility]. She instructed NCM to check back on Wednesday for status of authorization.    1545 If VA authorization received , Frances Furbish can provide home health RN with start of care date Wednesday 10/19/21 Expected Discharge [name]: Home w Home Health Services     Patient Goals and CMS Choice Patient states their goals for this hospitalization and ongoing recovery are:: to return to home CMS Medicare.gov Compare Post Acute Care list provided to:: [name]  Expected Discharge [name] Expected Discharge [name]: Home w Home Health Services     Post Acute Care Choice: Home  Health Living arrangements for the past 2 months: Single Family Home                           HH Arranged: RN          Prior Living Arrangements/Services Living arrangements for the past 2 months: Single Family Home Lives with:: Self Patient language and need for interpreter reviewed:: Yes Do you feel safe going back to the place where you live?: Yes      Need for Family Participation in Patient Care: Yes (Comment) Care giver support system in place?: Yes (comment)   Criminal Activity/Legal Involvement Pertinent to Current Situation/Hospitalization: No - Comment as needed  Activities of Daily Living      Permission Sought/Granted   Permission granted to share information with : No              Emotional Assessment Appearance:: Appears stated age Attitude/Demeanor/Rapport: Engaged Affect (typically observed): Accepting Orientation: : Oriented to Self, Oriented to Place, Oriented to  Time, Oriented to Situation Alcohol / Substance Use: Not Applicable Psych Involvement: No (comment)  Admission diagnosis:  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] Acute kidney injury (HCC) [N17.9] Wound infection after surgery [T81.49XA] Immunocompromised state due to drug therapy [facility]) [N17.001, Z79.899] Patient Active Problem List   Diagnosis Date Noted   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  Postoperative wound infection 10/15/2021   Ankylosing spondylitis (HCC) 10/15/2021   Hypertension 10/15/2021   Hyperlipidemia 10/15/2021   Chronic renal insufficiency 10/15/2021   Coronary artery disease 10/15/2021   OSA (obstructive sleep apnea) 10/15/2021   Pseudoarthrosis of cervical spine (HCC) 09/20/2021   Cervical radiculopathy 12/09/2020   Bilateral inguinal hernia 03/11/2015   PCP:  Clinic, [facility] Pharmacy:   [facility] - [place], [place] - [address] [place] [address] Phone: [phone]  Fax: [phone]     Social Determinants of Health (SDOH) Interventions    Readmission Risk Interventions No flowsheet data found.

## 2021-10-17 NOTE — Progress Notes (Signed)
[facility]    Date of Admission:  10/15/2021   Total days of antibiotics 3   ID: Bill [name] is a 66 y.o. male with  posterior C5-7 fusion on 1/3 subsequently developed wound dehiscence, purulent drainage, fever, aki on 11/26 went to OR on 11/28 for wash out. Found to have organ space SSI Principal Problem:   Postoperative wound infection Active Problems:   Ankylosing spondylitis (HCC)   Hypertension   Hyperlipidemia   Chronic renal insufficiency   Coronary artery disease   OSA (obstructive sleep apnea)   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HCC)    Subjective: Denies fever, neck pain. Has had some issues with PIV. Burning with installation of vancomycin  Medications:   allopurinol  300 mg Oral Daily   aspirin EC  81 mg Oral Daily   atorvastatin  20 mg Oral QHS   cholecalciferol  2,000 Units Oral QPM   docusate sodium  100 mg Oral BID   enoxaparin (LOVENOX) injection  40 mg Subcutaneous Q24H   folic acid  1 mg Oral QPM   hydrochlorothiazide  12.5 mg Oral Daily   lisinopril  20 mg Oral Daily   loratadine  10 mg Oral Daily   metoprolol tartrate  50 mg Oral Daily   montelukast  10 mg Oral QHS   rifampin  300 mg Oral Q12H   sodium chloride flush  3 mL Intravenous Q12H    Objective: Vital signs in last 24 hours: Temp:  [97.9 F (36.6 C)-98.2 F (36.8 C)] 98 F (36.7 C) (01/30 0738) Pulse Rate:  [74-90] 79 (01/30 0738) Resp:  [18-19] 18 (01/30 0429) BP: (141-156)/(57-73) 148/57 (01/30 0738) SpO2:  [97 %-99 %] 99 % (01/30 0738) Physical Exam  Constitutional: He is oriented to person, place, and time. He appears well-developed and well-nourished. No distress.  HENT:  Mouth/Throat: Oropharynx is clear and moist. No oropharyngeal exudate.  Cardiovascular: Normal rate, regular rhythm and normal heart sounds. Exam reveals no gallop and no friction rub.  No murmur heard.  Cervical= wound vac in place Pulmonary/Chest: Effort normal and breath sounds  normal. No respiratory distress. He has no wheezes.  Abdominal: Soft. Bowel sounds are normal. He exhibits no distension. There is no tenderness.  Lymphadenopathy:  He has no cervical adenopathy.  Ext: left fore arm PIV indurated Neurological: He is alert and oriented to person, place, and time.  Skin: Skin is warm and dry. No rash noted. No erythema.  Psychiatric: He has a normal mood and affect. His behavior is normal.    Lab Results Recent Labs    10/16/21 0135 10/17/21 0346  WBC 6.6 13.8*  HGB 10.4* 10.0*  HCT 31.3* 30.9*  NA 133* 137  K 4.0 4.5  CL 100 104  CO2 23 25  BUN 33* 34*  CREATININE 1.68* 1.41*   Liver Panel Recent Labs    10/16/21 0135 10/17/21 0346  PROT 6.5 6.4*  ALBUMIN 2.4* 2.3*  AST 61* 32  ALT 65* 42  ALKPHOS 118 107  BILITOT 2.0* 0.8     Microbiology: Blood cx 1/28 = ngtd OR cx 1/28 = few staph aureus Studies/Results: [place] EKG SITE RITE  Result Date: 10/17/2021 If Site Rite image not attached, placement could not be confirmed due to current cardiac rhythm.    Assessment/Plan: Organ space surgical site infection of cervical spine fusion s/p wash out on 1/28= micro results showing staph aureus. Sensitivities pending. Likely MSSA since he is colonized. Will continue  on vancomycin for the time being and change base on culture results. He will need 8 wk of Iv abtx using 1/29 as a day 1. We will get picc line placed today. Continue with wound vac.   Social work/home health = getting va approval for home iv abtx and wound care vac   Phlebitis = please remove left forearm piv due to induration/phlebitis associated with vancomycin  Leukocytosis = continue to check cbc to see that it normalizes with appropriate treatment  Aki = improving ;nearing baseline  [facility] for Infectious Diseases Pager: [phone]  10/17/2021, 12:33 PM

## 2021-10-17 NOTE — Progress Notes (Addendum)
Neurosurgery Service Progress Note  Subjective: No acute events overnight, no neck pain or radicular pain, overall feeling well, wants to be discharged when appropriate   Objective: Vitals:   10/16/21 1619 10/16/21 1954 10/17/21 0429 10/17/21 0738  BP: (!) 141/73 (!) 156/69 (!) 146/67 (!) 148/57  Pulse: 81 90 74 79  Resp: 19 19 18    Temp: 98 F (36.7 C) 98.2 F (36.8 C) 97.9 F (36.6 C) 98 F (36.7 C)  TempSrc: Oral Oral Oral Oral  SpO2: 97% 98% 98% 99%  Weight:      Height:        Physical Exam: Strength 5/5 x4 and SILTx4, cervical incision with wound vac in place with good seal  Assessment & Plan: 66 y.o. man s/p cervical PSIF for pseudoarthrosis with post-op wound infection s/p washout and wound vac application, recovering well.  -Cx positive for staph aureus, susceptibilities pending -no change in neurosurgical plan of care, Abx / PICC / etc per primary -okay for DVT chemoprophylaxis from my standpoint starting today (1/30), okay for discharge from a neurosurgical standpoint once his Abx etc are arranged -will continue to follow   01-20-1985  10/17/21 10:43 AM  Addendum: CDI query response - OR procedure included an excisional debridement to a depth of subcutaneous tissue and fascia, using scalpel and electrocautery.

## 2021-10-17 NOTE — Progress Notes (Signed)
Mobility Specialist Progress Note ° ° 10/17/21 1725  °Mobility  °Activity Ambulated independently in hallway  °Level of Assistance Modified independent, requires aide device or extra time  °Assistive Device  °(IV Pole)  °Distance Ambulated (ft) 550 ft  °Activity Response Tolerated well  °$Mobility charge 1 Mobility  ° °Received pt in EOB having no complaints and agreeable to mobility. Slight SOB towards end of ambulation otherwise asx throughout, returned back to bed w/ call bell in reach and all needs met. ° °  °Mobility Specialist °Phone Number [phone] ° °

## 2021-10-17 NOTE — Progress Notes (Signed)
Peripherally Inserted Central Catheter Placement  The IV Nurse has discussed with the patient and/or persons authorized to consent for the patient, the purpose of this procedure and the potential benefits and risks involved with this procedure.  The benefits include less needle sticks, lab draws from the catheter, and the patient may be discharged home with the catheter. Risks include, but not limited to, infection, bleeding, blood clot (thrombus formation), and puncture of an artery; nerve damage and irregular heartbeat and possibility to perform a PICC exchange if needed/ordered by physician.  Alternatives to this procedure were also discussed.  Bard Power PICC patient education guide, fact sheet on infection prevention and patient information card has been provided to patient /or left at bedside.    PICC Placement Documentation  PICC Single Lumen 10/17/21 Left Cephalic 44 cm 0 cm (Active)  Indication for Insertion or Continuance of Line Home intravenous therapies (PICC only) 10/17/21 1318  Exposed Catheter (cm) 0 cm 10/17/21 1318  Site Assessment Clean;Dry;Intact 10/17/21 1318  Line Status Flushed;Saline locked 10/17/21 1318  Dressing Type Transparent;Securing device 10/17/21 1318  Dressing Status Clean;Dry;Intact 10/17/21 1318  Antimicrobial disc in place? Yes 10/17/21 1318  Safety Lock Not Applicable 10/17/21 1318  Dressing Change Due 10/24/21 10/17/21 1318       Romie Jumper 10/17/2021, 1:29 PM

## 2021-10-17 NOTE — Consult Note (Addendum)
WOC Nurse Consult Note: Patient receiving care in [facility] [phone] Per [place] with Dr. Maurice Small he did not need to be present for dressing change.  Reason for Consult: Wound vac change Wound type: Surgical cervical incision  Pressure Injury POA: NA Measurement: 10 cm x 4.5 cm x 5 cm Wound bed: Pink and yellow down to the bone, no bleeding upon removal of dressing.  Drainage (amount, consistency, odor) Serous in canister Periwound: intact Dressing procedure/placement/frequency: Removed 2 pieces of black foam, replaced 2 pieces of black foam, drape applied and immediate suction obtained at 125 mmHg.  Dressing to be changed MWF by [facility] nurse.  Monitor the wound area(s) for worsening of condition such as: Signs/symptoms of infection, increase in size, development of or worsening of odor, development of pain, or increased pain at the affected locations.   Notify the medical team if any of these develop.  Thank you for the consult. WOC nurse will not follow at this time.   Please re-consult the WOC team if needed.  [name], MSN, RN, CMSRN, [name], [facility] Wound Treatment Associate Pager [phone]

## 2021-10-18 DIAGNOSIS — R7881 Bacteremia: Secondary | ICD-10-CM | POA: Diagnosis not present

## 2021-10-18 DIAGNOSIS — B9561 Methicillin susceptible Staphylococcus aureus infection as the cause of diseases classified elsewhere: Secondary | ICD-10-CM | POA: Diagnosis not present

## 2021-10-18 DIAGNOSIS — T8149XA Infection following a procedure, other surgical site, initial encounter: Secondary | ICD-10-CM | POA: Diagnosis not present

## 2021-10-18 LAB — BLOOD CULTURE ID PANEL (REFLEXED) - BCID2

## 2021-10-18 LAB — CBC WITH DIFFERENTIAL/PLATELET
Abs Immature Granulocytes: 0 10*3/uL (ref 0.00–0.07)
Basophils Absolute: 0.3 10*3/uL — ABNORMAL HIGH (ref 0.0–0.1)
Basophils Relative: 2 %
Eosinophils Absolute: 0 10*3/uL (ref 0.0–0.5)
Eosinophils Relative: 0 %
HCT: 31 % — ABNORMAL LOW (ref 39.0–52.0)
Hemoglobin: 10.2 g/dL — ABNORMAL LOW (ref 13.0–17.0)
Lymphocytes Relative: 11 %
Lymphs Abs: 1.6 10*3/uL (ref 0.7–4.0)
MCH: 30.6 pg (ref 26.0–34.0)
MCHC: 32.9 g/dL (ref 30.0–36.0)
MCV: 93.1 fL (ref 80.0–100.0)
Monocytes Absolute: 0.8 10*3/uL (ref 0.1–1.0)
Monocytes Relative: 6 %
Neutro Abs: 11.4 10*3/uL — ABNORMAL HIGH (ref 1.7–7.7)
Neutrophils Relative %: 81 %
Platelets: 218 10*3/uL (ref 150–400)
RBC: 3.33 MIL/uL — ABNORMAL LOW (ref 4.22–5.81)
RDW: 14.3 % (ref 11.5–15.5)
WBC: 14.1 10*3/uL — ABNORMAL HIGH (ref 4.0–10.5)
nRBC: 0 /100 WBC
nRBC: 0.2 % (ref 0.0–0.2)

## 2021-10-18 LAB — BASIC METABOLIC PANEL
Anion gap: 10 (ref 5–15)
BUN: 26 mg/dL — ABNORMAL HIGH (ref 8–23)
CO2: 25 mmol/L (ref 22–32)
Calcium: 8.5 mg/dL — ABNORMAL LOW (ref 8.9–10.3)
Chloride: 102 mmol/L (ref 98–111)
Creatinine, Ser: 1.32 mg/dL — ABNORMAL HIGH (ref 0.61–1.24)
GFR, Estimated: >60 mL/min (ref >60)
Glucose, Bld: 94 mg/dL (ref 70–99)
Potassium: 3.5 mmol/L (ref 3.5–5.1)
Sodium: 137 mmol/L (ref 135–145)

## 2021-10-18 MED ORDER — CEFAZOLIN SODIUM-DEXTROSE 2-4 GM/100ML-% IV SOLN: 2.0000 g | Freq: Three times a day (TID) | INTRAVENOUS | Status: DC

## 2021-10-18 MED ORDER — CEFAZOLIN SODIUM-DEXTROSE 2-4 GM/100ML-% IV SOLN
2.0000 g | Freq: Three times a day (TID) | INTRAVENOUS | Status: DC
Start: 2021-10-18 — End: 2021-10-20
  Administered 2021-10-18 – 2021-10-20 (×6): 2 g via INTRAVENOUS
  Filled 2021-10-18 (×9): qty 100

## 2021-10-18 NOTE — TOC Progression Note (Signed)
Transition of Care [facility]) - Progression Note    Patient Details  Name: Bill [name] MRN: [id number] Date of Birth: [birth year]  Transition of Care [facility]) CM/SW Contact  Nadene Rubins Adria Devon, RN Phone Number: 10/18/2021, 11:21 AM  Clinical Narrative:     Still waiting on authorization from [facility] for VAC, HHRN and IV ABX. Patient aware.   Expected Discharge Plan: Home w Home Health Services    Expected Discharge Plan and Services Expected Discharge Plan: Home w Home Health Services     Post Acute Care Choice: Home Health Living arrangements for the past 2 months: Single Family Home                           HH Arranged: RN           Social Determinants of Health (SDOH) Interventions    Readmission Risk Interventions No flowsheet data found.

## 2021-10-18 NOTE — Progress Notes (Addendum)
PHARMACY CONSULT NOTE FOR:  OUTPATIENT  PARENTERAL ANTIBIOTIC THERAPY (OPAT)  Indication: MSSA cervical spine surgical osteomyelitis  Regimen: cefazolin 2g IV q8h End date: 11/27/21  IV antibiotic discharge orders are pended. To discharging provider:  please sign these orders via discharge navigator,  Select New Orders & click on the button choice - Manage This Unsigned Work.     Thank you for allowing pharmacy to be a part of this patient's care.  Larena Sox, PharmD Candidate  10/18/2021 10:58 AM

## 2021-10-18 NOTE — Progress Notes (Signed)
Neurosurgery Service Progress Note  Subjective: No acute events overnight, no neck pain or radicular pain, had some left shoulder pain but otherwise no complaints  Objective: Vitals:   10/17/21 2119 10/18/21 0431 10/18/21 0432 10/18/21 0745  BP: (!) 148/70 (!) 147/66 (!) 147/66 (!) 163/69  Pulse: 85 87 88 93  Resp:  18  18  Temp: 98.2 F (36.8 C) 98.6 F (37 C) 98.6 F (37 C) 99.7 F (37.6 C)  TempSrc: Oral Oral Oral Oral  SpO2: 96% 98% 98% 96%  Weight:      Height:        Physical Exam: Strength 5/5 x4 and SILTx4, cervical incision with wound vac in place with good seal  Assessment & Plan: 66 y.o. man s/p cervical PSIF for pseudoarthrosis with post-op wound infection s/p washout and wound vac application, recovering well.  -no change in neurosurgical plan of care, Abx / PICC / etc per primary -okay for DVT chemoprophylaxis from my standpoint starting today (1/30), okay for discharge from a neurosurgical standpoint once his Abx etc are arranged -will continue to follow   Judith Part  10/18/21 6:02 PM

## 2021-10-18 NOTE — Progress Notes (Signed)
[facility] for Infectious Disease    Date of Admission:  10/15/2021   Total days of antibiotics 4/vanco and rif day 3           ID: Bill [name] is a 66 y.o. male with  MSSA cervical fusion SSI with associated MSSA bacteremia Principal Problem:   Postoperative wound infection Active Problems:   Ankylosing spondylitis (HCC)   Hypertension   Hyperlipidemia   Chronic renal insufficiency   Coronary artery disease   OSA (obstructive sleep apnea)   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HCC)    Subjective: Tolerated taking rifampin wihtout difficulty. He had picc line placed and no discomfort. His right forearm still sore from infiltrated PIV site. His wound vac had to be redone since the tubing became dislodged  His blood cx from 1/28 grew + in 1/4 bottles at 72hrs this morning.  Medications:   allopurinol  300 mg Oral Daily   aspirin EC  81 mg Oral Daily   atorvastatin  20 mg Oral QHS   Chlorhexidine Gluconate Cloth  6 each Topical Daily   cholecalciferol  2,000 Units Oral QPM   enoxaparin (LOVENOX) injection  40 mg Subcutaneous [id number]   folic acid  1 mg Oral QPM   hydrochlorothiazide  12.5 mg Oral Daily   lisinopril  20 mg Oral Daily   loratadine  10 mg Oral Daily   metoprolol tartrate  50 mg Oral Daily   montelukast  10 mg Oral QHS   rifampin  300 mg Oral Q12H   senna-docusate  1 tablet Oral QHS   sodium chloride flush  3 mL Intravenous Q12H    Objective: Vital signs in last 24 hours: Temp:  [98.2 F (36.8 C)-99.7 F (37.6 C)] 99.7 F (37.6 C) (01/31 0745) Pulse Rate:  [74-93] 93 (01/31 0745) Resp:  [16-18] 18 (01/31 0745) BP: (138-163)/(66-79) 163/69 (01/31 0745) SpO2:  [96 %-99 %] 96 % (01/31 0745) Physical Exam  Constitutional: He is oriented to person, place, and time. He appears well-developed and well-nourished. No distress.  HENT:  Mouth/Throat: Oropharynx is clear and moist. No oropharyngeal exudate.  Back= wound vac in place Cardiovascular: Normal rate,  regular rhythm and normal heart sounds. Exam reveals no gallop and no friction rub.  No murmur heard.  Pulmonary/Chest: Effort normal and breath sounds normal. No respiratory distress. He has no wheezes.  Abdominal: Soft. Bowel sounds are normal. He exhibits no distension. There is no tenderness.  Ext: mild induration to right forearm from PIV. Left arm PICC is c/d/i Lymphadenopathy:  He has no cervical adenopathy.  Neurological: He is alert and oriented to person, place, and time.  Skin: Skin is warm and dry. No rash noted. No erythema.  Psychiatric: He has a normal mood and affect. His behavior is normal.    Lab Results Recent Labs    10/17/21 0346 10/18/21 0429 10/18/21 0828  WBC 13.8*  --  14.1*  HGB 10.0*  --  10.2*  HCT 30.9*  --  31.0*  NA 137 137  --   K 4.5 3.5  --   CL 104 102  --   CO2 25 25  --   BUN 34* 26*  --   CREATININE 1.41* 1.32*  --    Liver Panel Recent Labs    10/16/21 0135 10/17/21 0346  PROT 6.5 6.4*  ALBUMIN 2.4* 2.3*  AST 61* 32  ALT 65* 42  ALKPHOS 118 107  BILITOT 2.0* 0.8  No results found for:  ESRSEDRATE, POCTSEDRATE  Microbiology: 1/28 blood cx 1 of 4 MSSA by BCID 1/28 or specimen -MSSA Staphylococcus aureus      MIC    CIPROFLOXACIN <=0.5 SENSI... Sensitive    CLINDAMYCIN <=0.25 SENS... Sensitive    ERYTHROMYCIN <=0.25 SENS... Sensitive    GENTAMICIN <=0.5 SENSI... Sensitive    Inducible Clindamycin NEGATIVE  Sensitive    OXACILLIN 0.5 SENSITIVE  Sensitive    RIFAMPIN <=0.5 SENSI... Sensitive    TETRACYCLINE <=1 SENSITIVE  Sensitive    TRIMETH/SULFA <=10 SENSIT... Sensitive    VANCOMYCIN 1 SENSITIVE  Sensitive    Studies/Results: [place] EKG SITE RITE  Result Date: 10/17/2021 If Site Rite image not attached, placement could not be confirmed due to current cardiac rhythm.    Assessment/Plan: 71yoM with MSSA cervical fusion SSI s/p washout on 1/28, also had secondary bacteremia identified -MSSA + blood cx that grew on 1/31  (roughly 72hrs). He had picc line placed on 1/30   - recommend to narrow abtx to cefazolin 2gm IV A q8hr plus oral rifampin 300mg  BID - plan for 8wks - wound vac per neurosurgery - will check sed rate and crp  In terms of the secondary bacteremia = no need to pull line. Suspect this is low level bacteremia since it took 3 days to grow on culture. For the time being, will continue with current picc line. Since he has been on appropriate IV abtx x 3 days-unlikely to be infected picc line. No need for repeat blood cx.  Recommend to get TTE.  Mild transaminitis = now back to baseline  Leukocytosis = anticipate to get closer to baseline as more days of therapy  [facility] for Infectious Diseases Pager: [phone]  10/18/2021, 12:45 PM

## 2021-10-18 NOTE — Progress Notes (Signed)
PHARMACY CONSULT NOTE FOR:  OUTPATIENT  PARENTERAL ANTIBIOTIC THERAPY (OPAT)  Indication: MSSA cervical spine surgical osteomyelitis  Regimen: cefazolin 2g IV q8h End date: 12/11/21  IV antibiotic discharge orders are pended. To discharging provider:  please sign these orders via discharge navigator,  Select New Orders & click on the button choice - Manage This Unsigned Work.     Thank you for allowing pharmacy to be a part of this patient's care.  Sharin Mons, PharmD, BCPS, BCIDP Infectious Diseases Clinical Pharmacist Phone: [phone] 10/18/2021 5:37 PM

## 2021-10-18 NOTE — Progress Notes (Signed)
Mobility Specialist Progress Note   10/18/21 1700  Mobility  Activity Refused mobility   Stated they have been moving all day and now feel as if they have an onset fever coming on and pain up in the shoulder blades. Would like to rest for the rest of the day. Will f/u tomorrow.  Frederico Hamman Mobility Specialist Phone Number [phone]

## 2021-10-18 NOTE — Progress Notes (Signed)
HD#2 SUBJECTIVE:  Patient Summary: Bill [name] is a 66 y.o. with a pertinent PMH of ankylosing spondylitis, CKD stage IIIa, hypertension, hyperlipidemia, CAD and recent posterior cervical fusion who presented to the ED with fevers and drainage from his surgical siteand admitted for postop wound infection.    Overnight Events: None  Interim History: Patient endorses feeling well today and is up in bedside recliner.  He does state that he felt cold prior to going to bed and woke up feeling warm though has no other complaints.  He did have a bowel movement yesterday.  OBJECTIVE:  Vital Signs: Vitals:   10/17/21 2118 10/17/21 2119 10/18/21 0431 10/18/21 0432  BP: (!) 148/70 (!) 148/70 (!) 147/66 (!) 147/66  Pulse: 85 85 87 88  Resp: 16  18   Temp: 98.2 F (36.8 C) 98.2 F (36.8 C) 98.6 F (37 C) 98.6 F (37 C)  TempSrc: Oral Oral Oral Oral  SpO2: 96% 96% 98% 98%  Weight:      Height:       Supplemental O2: Room Air SpO2: 98 % O2 Flow Rate (L/min): 5 L/min  Filed Weights   10/15/21 1453  Weight: 134.4 kg     Intake/Output Summary (Last 24 hours) at 10/18/2021 [id number] Last data filed at 10/18/2021 0433 Gross per 24 hour  Intake 400 ml  Output 1100 ml  Net -700 ml   Net IO Since Admission: -165 mL [10/18/21 0635]  Physical Exam: Constitutional: Pleasant gentleman resting in bedside recliner, no acute distress. Cardio: Regular rate and rhythm.  No murmurs, rubs, gallops. Pulm: Normal work of breathing on room air. MSK: 1+ pitting edema noted to bilateral feet. Skin: Skin is warm and dry.  Wound VAC in place over upper back with mild erythema at wound margins. Neuro: Alert and oriented x3.  No focal deficit noted. Psych: Normal mood and affect.  Patient Lines/Drains/Airways Status     Active Line/Drains/Airways     Name Placement date Placement time Site Days   Peripheral IV 10/15/21 20 G Anterior;Left;Proximal Forearm 10/15/21  0914  Forearm  3   PICC Single  Lumen [id number] Left Cephalic 44 cm 0 cm 123456  [id number]  Cephalic  1   Negative Pressure Wound Therapy Cervical Posterior 10/15/21  1632  --  3   Incision (Closed) 03/11/15 Abdomen Other (Comment) 03/11/15  1230  -- 2413   Incision (Closed) 12/09/20 Cervical 12/09/20  1033  -- 313   Incision (Closed) 09/20/21 Neck 09/20/21  1338  -- 28   Incision (Closed) 10/15/21 Cervical 10/15/21  1648  -- 3             ASSESSMENT/PLAN:  Assessment: Principal Problem:   Postoperative wound infection Active Problems:   Ankylosing spondylitis (HCC)   Hypertension   Hyperlipidemia   Chronic renal insufficiency   Coronary artery disease   OSA (obstructive sleep apnea)   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HCC)   Plan: #Postoperative wound infection #Hx of 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  Wound VAC was placed at that time, most recently changed 01/30.  Wound culture positive for staph aureus.  Blood cultures also resulted positive for gram-positive cocci in clusters, specifically staph aureus in 1 of 4 bottles, without resistance 01/31.  Patient has continued to be afebrile, does have a leukocytosis to 14.1. -Neurosurgery following, appreciate their assistance             -Stable from their standpoint for discharge -  Infectious disease following, appreciate their assistance             -Given new blood culture result and previously noted wound culture, patient is now on cefazolin 2 g IV 3 times daily, rifampin 300 mg p.o. twice daily. -CTM blood cultures -Trend CBC -Oxycodone 5 mg every 4 hours as needed   #Ankylosing spondylitis Currently holding etanercept and methotrexate in the setting of acute infection. -Continue folic acid   #AKI on CKD Creatinine is roughly at patient's baseline with most recent value of 1.32. -Encourage p.o. intake -Avoid nephrotoxic agents -Trend BMP   #Elevated liver enzymes; resolved Patient noted to have  possible history of fatty liver on chart review.  Liver enzymes within normal limits at this time. -Trend CMP   #Prediabetes A1c 5.8. -Dietary counseling   #HTN Patient has been hypertensive over the last 24 hours.  Home lisinopril-HCTZ was restarted 01/30 in addition to metoprolol. -Continue home lisinopril-HCTZ, metoprolol   #HLD -Continue atorvastatin   #OSA -CPAP QHS  Best Practice: Diet: Regular diet IVF: Fluids: IV push only, no IV fluids VTE: enoxaparin (LOVENOX) injection 40 mg Start: 10/16/21 0800 Code: Full AB: Cefazolin 2 g IV 3 times daily, rifampin 300 mg p.o. twice daily DISPO: Anticipated discharge in 1-3 days to Home.  Signature: Farrel Gordon, D.O.  Internal Medicine Resident, PGY-1 [facility]  Pager: [phone] 6:35 AM, 10/18/2021   Please contact the on call pager after 5 pm and on weekends at [phone].

## 2021-10-18 NOTE — Progress Notes (Signed)
PHARMACY - PHYSICIAN COMMUNICATION CRITICAL VALUE ALERT - BLOOD CULTURE IDENTIFICATION (BCID)  Bill [name] is an 66 y.o. male who presented to [facility] on 10/15/2021 with a chief complaint of organ space surgical site infection.   Assessment:  1 out of 4 bottles (anaerobic bottle only) with GPC in clusters, specifically Staph aureus, BCID with no resistance.    Name of physician (or Provider) Contacted: Dr. Judyann Munson (Infectious Disease), Dr. Belva Agee (Internal Medicine)  Current antibiotics: Vancomycin 1500 mg IV q24h   Changes to prescribed antibiotics recommended: Dr. Drue Second informed of blood culture results and will adjust antibiotics accordingly.   Results for orders placed or performed during the hospital encounter of 10/15/21  Blood Culture ID Panel (Reflexed) (Collected: 10/15/2021  9:04 AM)  Result Value Ref Range   Enterococcus faecalis NOT DETECTED NOT DETECTED   Enterococcus Faecium NOT DETECTED NOT DETECTED   Listeria monocytogenes NOT DETECTED NOT DETECTED   Staphylococcus species DETECTED (A) NOT DETECTED   Staphylococcus aureus (BCID) DETECTED (A) NOT DETECTED   Staphylococcus epidermidis NOT DETECTED NOT DETECTED   Staphylococcus lugdunensis NOT DETECTED NOT DETECTED   Streptococcus species NOT DETECTED NOT DETECTED   Streptococcus agalactiae NOT DETECTED NOT DETECTED   Streptococcus pneumoniae NOT DETECTED NOT DETECTED   Streptococcus pyogenes NOT DETECTED NOT DETECTED   A.calcoaceticus-baumannii NOT DETECTED NOT DETECTED   Bacteroides fragilis NOT DETECTED NOT DETECTED   Enterobacterales NOT DETECTED NOT DETECTED   Enterobacter cloacae complex NOT DETECTED NOT DETECTED   Escherichia coli NOT DETECTED NOT DETECTED   Klebsiella aerogenes NOT DETECTED NOT DETECTED   Klebsiella oxytoca NOT DETECTED NOT DETECTED   Klebsiella pneumoniae NOT DETECTED NOT DETECTED   Proteus species NOT DETECTED NOT DETECTED   Salmonella species NOT DETECTED NOT  DETECTED   Serratia marcescens NOT DETECTED NOT DETECTED   Haemophilus influenzae NOT DETECTED NOT DETECTED   Neisseria meningitidis NOT DETECTED NOT DETECTED   Pseudomonas aeruginosa NOT DETECTED NOT DETECTED   Stenotrophomonas maltophilia NOT DETECTED NOT DETECTED   Candida albicans NOT DETECTED NOT DETECTED   Candida auris NOT DETECTED NOT DETECTED   Candida glabrata NOT DETECTED NOT DETECTED   Candida krusei NOT DETECTED NOT DETECTED   Candida parapsilosis NOT DETECTED NOT DETECTED   Candida tropicalis NOT DETECTED NOT DETECTED   Cryptococcus neoformans/gattii NOT DETECTED NOT DETECTED   Meth resistant mecA/C and MREJ NOT DETECTED NOT DETECTED    Jerrilyn Cairo, PharmD PGY1 Pharmacy Resident 10/18/2021 10:45 AM   Please check AMION for all [facility] Pharmacy phone numbers After 10:00 PM, call Main Pharmacy [phone]

## 2021-10-19 ENCOUNTER — Inpatient Hospital Stay (HOSPITAL_COMMUNITY): Payer: No Typology Code available for payment source

## 2021-10-19 DIAGNOSIS — R7881 Bacteremia: Secondary | ICD-10-CM

## 2021-10-19 DIAGNOSIS — T8149XA Infection following a procedure, other surgical site, initial encounter: Secondary | ICD-10-CM | POA: Diagnosis not present

## 2021-10-19 DIAGNOSIS — M79601 Pain in right arm: Secondary | ICD-10-CM | POA: Diagnosis not present

## 2021-10-19 DIAGNOSIS — B9561 Methicillin susceptible Staphylococcus aureus infection as the cause of diseases classified elsewhere: Secondary | ICD-10-CM | POA: Diagnosis not present

## 2021-10-19 LAB — CBC
HCT: 32.9 % — ABNORMAL LOW (ref 39.0–52.0)
Hemoglobin: 10.7 g/dL — ABNORMAL LOW (ref 13.0–17.0)
MCH: 30 pg (ref 26.0–34.0)
MCHC: 32.5 g/dL (ref 30.0–36.0)
MCV: 92.2 fL (ref 80.0–100.0)
Platelets: 221 10*3/uL (ref 150–400)
RBC: 3.57 MIL/uL — ABNORMAL LOW (ref 4.22–5.81)
RDW: 14.3 % (ref 11.5–15.5)
WBC: 16.1 10*3/uL — ABNORMAL HIGH (ref 4.0–10.5)
nRBC: 0.2 % (ref 0.0–0.2)

## 2021-10-19 LAB — ECHOCARDIOGRAM COMPLETE
AR max vel: 3.72 cm2
AV Area VTI: 3.69 cm2
AV Area mean vel: 3.69 cm2
AV Mean grad: 3 mmHg
AV Peak grad: 6.2 mmHg
Ao pk vel: 1.24 m/s
Area-P 1/2: 3.5 cm2
Calc EF: 76.6 %
Height: 70 in
MV VTI: 4.04 cm2
S' Lateral: 2.4 cm
Single Plane A2C EF: 83.4 %
Single Plane A4C EF: 68.2 %
Weight: 4742.18 oz

## 2021-10-19 LAB — BASIC METABOLIC PANEL
Anion gap: 9 (ref 5–15)
BUN: 21 mg/dL (ref 8–23)
CO2: 27 mmol/L (ref 22–32)
Calcium: 8.6 mg/dL — ABNORMAL LOW (ref 8.9–10.3)
Chloride: 100 mmol/L (ref 98–111)
Creatinine, Ser: 1.36 mg/dL — ABNORMAL HIGH (ref 0.61–1.24)
GFR, Estimated: 58 mL/min — ABNORMAL LOW (ref >60)
Glucose, Bld: 110 mg/dL — ABNORMAL HIGH (ref 70–99)
Potassium: 3.5 mmol/L (ref 3.5–5.1)
Sodium: 136 mmol/L (ref 135–145)

## 2021-10-19 MED ORDER — PERFLUTREN LIPID MICROSPHERE
1.0000 mL | INTRAVENOUS | Status: AC | PRN
  Administered 2021-10-19: 2 mL via INTRAVENOUS
  Filled 2021-10-19: qty 10

## 2021-10-19 NOTE — Progress Notes (Signed)
HD#3 Subjective:  Overnight Events: No acute events overnight  Patient resting in recliner well.  States that he is ready to be discharged.  He has no acute concerns at this time.  Objective:  Vital signs in last 24 hours: Vitals:   10/18/21 2031 10/19/21 0407 10/19/21 0721 10/19/21 1548  BP: (!) 145/78 129/67 (!) 146/78 124/73  Pulse: 82 95 96 88  Resp: 16 16 16 16   Temp: 99 F (37.2 C) 98.5 F (36.9 C) 99.1 F (37.3 C) 98.2 F (36.8 C)  TempSrc: Oral Oral Oral Oral  SpO2: 96% 98% 94% 95%  Weight:      Height:       Supplemental O2: Saturating well room air  Physical Exam:  Constitutional: Well appearing gentleman resting in bedside recliner. No acute distress noted. Cardio: Regular rate and rhythm. No murmurs, rubs, gallops. Pulm: Clear to auscultation bilaterally.  MSK: Negative for extremity edema. Skin: Skin is warm and dry.  Clean wound vac over lower cervical and upper thoracic spine levels without increased warmth or erythema.  Neuro: Alert and oriented x3. No focal deficit noted. Psych: Normal mood and affect.    Filed Weights   10/15/21 1453  Weight: 134.4 kg    Intake/Output Summary (Last 24 hours) at 10/19/2021 1828 Last data filed at 10/19/2021 0500 Gross per 24 hour  Intake 320 ml  Output 650 ml  Net -330 ml   Net IO Since Admission: -545 mL [10/19/21 1828]  Pertinent Labs: CBC Latest Ref Rng & Units 10/19/2021 10/18/2021 10/17/2021  WBC 4.0 - 10.5 K/uL 16.1(H) 14.1(H) 13.8(H)  Hemoglobin 13.0 - 17.0 g/dL 10.7(L) 10.2(L) 10.0(L)  Hematocrit 39.0 - 52.0 % 32.9(L) 31.0(L) 30.9(L)  Platelets 150 - 400 K/uL 221 218 229    CMP Latest Ref Rng & Units 10/19/2021 10/18/2021 10/17/2021  Glucose 70 - 99 mg/dL 110(H) 94 134(H)  BUN 8 - 23 mg/dL 21 26(H) 34(H)  Creatinine 0.61 - 1.24 mg/dL 1.36(H) 1.32(H) 1.41(H)  Sodium 135 - 145 mmol/L 136 137 137  Potassium 3.5 - 5.1 mmol/L 3.5 3.5 4.5  Chloride 98 - 111 mmol/L 100 102 104  CO2 22 - 32 mmol/L 27 25 25    Calcium 8.9 - 10.3 mg/dL 8.6(L) 8.5(L) 8.7(L)  Total Protein 6.5 - 8.1 g/dL - - 6.4(L)  Total Bilirubin 0.3 - 1.2 mg/dL - - 0.8  Alkaline Phos 38 - 126 U/L - - 107  AST 15 - 41 U/L - - 32  ALT 0 - 44 U/L - - 42    Imaging: ECHOCARDIOGRAM COMPLETE  Result Date: 10/19/2021    ECHOCARDIOGRAM REPORT   Patient Name:   Bill [name] Date of Exam: 10/19/2021 Medical Rec #:  [id number]          Height:       70.0 in Accession #:    [id number]         Weight:       296.4 lb Date of Birth:  October 29, 1955          BSA:          2.467 m Patient Age:    66 years           BP:           146/78 mmHg Patient Gender: M                  HR:           83 bpm. Exam  Location:  Inpatient Procedure: 2D Echo, Cardiac Doppler, Color Doppler and Intracardiac            Opacification Agent Indications:    Bacteremia  History:        Patient has no prior history of Echocardiogram examinations. CAD                 and Previous Myocardial Infarction; Risk Factors:Hypertension                 and Dyslipidemia.  Sonographer:    TLC Referring Phys: [id number] ERIK C HOFFMAN IMPRESSIONS  1. Left ventricular ejection fraction, by estimation, is 60 to 65%. The left ventricle has normal function. The left ventricle has no regional wall motion abnormalities. There is mild concentric left ventricular hypertrophy. Left ventricular diastolic parameters are consistent with Grade I diastolic dysfunction (impaired relaxation).  2. Right ventricular systolic function is normal. The right ventricular size is normal.  3. There is a highly mobile echodense mass on the posterior leaflet of the mitral valve which can be seen on parasternal long axis view ( frame 14). This is poor visualized could be calcium on the leaflet tips vs vegetation. Consider TEE if clinically indicated. Trivial mitral valve regurgitation. No evidence of mitral stenosis.  4. The aortic valve is tricuspid. Aortic valve regurgitation is not visualized. No aortic stenosis is present.  5.  The inferior vena cava is normal in size with greater than 50% respiratory variability, suggesting right atrial pressure of 3 mmHg. Comparison(s): No prior Echocardiogram. Conclusion(s)/Recommendation(s): Cannot exclude vegetation on the posterior mitral leaflet. Consider transesophageal echocardiogram, if clinically indicated. FINDINGS  Left Ventricle: Left ventricular ejection fraction, by estimation, is 60 to 65%. The left ventricle has normal function. The left ventricle has no regional wall motion abnormalities. The left ventricular internal cavity size was normal in size. There is  mild concentric left ventricular hypertrophy. Left ventricular diastolic parameters are consistent with Grade I diastolic dysfunction (impaired relaxation). Right Ventricle: The right ventricular size is normal. No increase in right ventricular wall thickness. Right ventricular systolic function is normal. Left Atrium: Left atrial size was normal in size. Right Atrium: Right atrial size was normal in size. Pericardium: There is no evidence of pericardial effusion. Mitral Valve: There is a highly mobile echodense mass on the posterior leaflet of the mitral valve which can be seen on parasternal long axis view ( frame 14). This is poor visualized could be calcium on the leaflet tips vs vegetation. Consider TEE if clinically indicated. Trivial mitral valve regurgitation. No evidence of mitral valve stenosis. MV peak gradient, 3.7 mmHg. The mean mitral valve gradient is 1.0 mmHg. Tricuspid Valve: The tricuspid valve is normal in structure. Tricuspid valve regurgitation is not demonstrated. No evidence of tricuspid stenosis. Aortic Valve: The aortic valve is tricuspid. Aortic valve regurgitation is not visualized. No aortic stenosis is present. Aortic valve mean gradient measures 3.0 mmHg. Aortic valve peak gradient measures 6.2 mmHg. Aortic valve area, by VTI measures 3.69 cm. Pulmonic Valve: The pulmonic valve was normal in structure.  Pulmonic valve regurgitation is not visualized. No evidence of pulmonic stenosis. Aorta: The aortic root is normal in size and structure. Venous: The inferior vena cava is normal in size with greater than 50% respiratory variability, suggesting right atrial pressure of 3 mmHg. IAS/Shunts: No atrial level shunt detected by color flow Doppler.  LEFT VENTRICLE PLAX 2D LVIDd:         4.60 cm     Diastology  LVIDs:         2.40 cm     LV e' medial:    7.94 cm/s LV PW:         1.35 cm     LV E/e' medial:  8.8 LV IVS:        1.10 cm     LV e' lateral:   8.92 cm/s LVOT diam:     2.40 cm     LV E/e' lateral: 7.8 LV SV:         80 LV SV Index:   32 LVOT Area:     4.52 cm  LV Volumes (MOD) LV vol d, MOD A2C: 67.9 ml LV vol d, MOD A4C: 87.6 ml LV vol s, MOD A2C: 11.3 ml LV vol s, MOD A4C: 27.9 ml LV SV MOD A2C:     56.6 ml LV SV MOD A4C:     87.6 ml LV SV MOD BP:      59.3 ml RIGHT VENTRICLE RV Basal diam:  3.50 cm RV Mid diam:    2.20 cm RV S prime:     23.50 cm/s TAPSE (M-mode): 2.2 cm LEFT ATRIUM             Index        RIGHT ATRIUM           Index LA diam:        3.20 cm 1.30 cm/m   RA Area:     10.60 cm LA Vol (A2C):   42.1 ml 17.06 ml/m  RA Volume:   21.50 ml  8.71 ml/m LA Vol (A4C):   62.5 ml 25.33 ml/m LA Biplane Vol: 54.0 ml 21.89 ml/m  AORTIC VALVE                    PULMONIC VALVE AV Area (Vmax):    3.72 cm     PV Vmax:       0.99 m/s AV Area (Vmean):   3.69 cm     PV Vmean:      76.700 cm/s AV Area (VTI):     3.69 cm     PV VTI:        0.213 m AV Vmax:           124.00 cm/s  PV Peak grad:  3.9 mmHg AV Vmean:          83.600 cm/s  PV Mean grad:  3.0 mmHg AV VTI:            0.216 m AV Peak Grad:      6.2 mmHg AV Mean Grad:      3.0 mmHg LVOT Vmax:         102.00 cm/s LVOT Vmean:        68.100 cm/s LVOT VTI:          0.176 m LVOT/AV VTI ratio: 0.81  AORTA Ao Asc diam: 3.30 cm MITRAL VALVE               TRICUSPID VALVE MV Area (PHT): 3.50 cm    TR Peak grad:   39.2 mmHg MV Area VTI:   4.04 cm    TR Vmax:         313.00 cm/s MV Peak grad:  3.7 mmHg MV Mean grad:  1.0 mmHg    SHUNTS MV Vmax:       0.97 m/s    Systemic VTI:  0.18 m MV Vmean:  52.9 cm/s   Systemic Diam: 2.40 cm MV Decel Time: 217 msec MV E velocity: 69.80 cm/s MV A velocity: 73.70 cm/s MV E/A ratio:  0.95 Kardie Tobb DO Electronically signed by Berniece Salines DO Signature Date/Time: 10/19/2021/4:43:21 PM    Final    VAS [place] UPPER EXTREMITY VENOUS DUPLEX  Result Date: 10/19/2021 UPPER VENOUS STUDY  Patient Name:  Bill [name]  Date of Exam:   10/19/2021 Medical Rec #: [id number]           Accession #:    [id number] Date of Birth: [birth year]           Patient Gender: M Patient Age:   43 years Exam Location:  [facility] Procedure:      VAS [place] UPPER EXTREMITY VENOUS DUPLEX Referring Phys: Lottie Mussel --------------------------------------------------------------------------------  Indications: tenderness Comparison Study: no prior Performing Technologist: Archie Patten RVS  Examination Guidelines: A complete evaluation includes B-mode imaging, spectral Doppler, color Doppler, and power Doppler as needed of all accessible portions of each vessel. Bilateral testing is considered an integral part of a complete examination. Limited examinations for reoccurring indications may be performed as noted.  Right Findings: +----------+------------+---------+-----------+----------+-------+  RIGHT      Compressible Phasicity Spontaneous Properties Summary  +----------+------------+---------+-----------+----------+-------+  IJV            Full        Yes        Yes                         +----------+------------+---------+-----------+----------+-------+  Subclavian     Full        Yes        Yes                         +----------+------------+---------+-----------+----------+-------+  Axillary       Full        Yes        Yes                         +----------+------------+---------+-----------+----------+-------+  Brachial       Full        Yes        Yes                          +----------+------------+---------+-----------+----------+-------+  Radial         Full                                               +----------+------------+---------+-----------+----------+-------+  Ulnar          Full                                               +----------+------------+---------+-----------+----------+-------+  Cephalic       Full                                               +----------+------------+---------+-----------+----------+-------+  Basilic  Full                                               +----------+------------+---------+-----------+----------+-------+  Left Findings: +----------+------------+---------+-----------+----------+-------+  LEFT       Compressible Phasicity Spontaneous Properties Summary  +----------+------------+---------+-----------+----------+-------+  Subclavian                 Yes        Yes                         +----------+------------+---------+-----------+----------+-------+  Summary:  Right: No evidence of deep vein thrombosis in the upper extremity. No evidence of superficial vein thrombosis in the upper extremity.  Left: No evidence of thrombosis in the subclavian.  *See table(s) above for measurements and observations.  Diagnosing physician: Jamelle Haring Electronically signed by Jamelle Haring on 10/19/2021 at 3:57:02 PM.    Final     Assessment/Plan:   Principal Problem:   Postoperative wound infection Active Problems:   Ankylosing spondylitis (HCC)   Hypertension   Hyperlipidemia   Chronic renal insufficiency   Coronary artery disease   OSA (obstructive sleep apnea)   Paraspinal abscess [facility])   Patient Summary: Bill [name] is a 66 y.o. with a pertinent PMH of ankylosing spondylitis, CKD stage IIIa, hypertension, hyperlipidemia, CAD and recent posterior cervical fusion, who presented with fevers and drainage from a surgical site and admitted for postoperative wound infection.    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  Wound cultures positive for MSSA,  ID following, appreciate their assistance in his care.  Recommended cefazolin 2 g IV every 8 hours plus oral rifampin 300 mg twice daily for a total of 8 weeks.  PICC line placed on 1/30 -8 weeks cefazolin 2 g IV every 8 hours and rifampin 300 mg twice daily, IV therapy via home health -Follow-up in ID clinic in 4 to 6 weeks after discharge -Continue wound checks via neurosurgery and wound care -Patient has telehealth appointment with PCP on Friday 2/3  Secondary bacteremia Patient had positive blood cultures of MSSA and 1 out of 4 vials on 1/31, 72 hours after trying the cultures.  Discussed with infectious diseases suspect that this is a low-level bacteremia and do not believe that his PICC line needs to be pulled.  They did recommended TTE, this was performed today.  Per cardiology it was difficult to discern if patient has a mitral valve vegetation versus calcification and they recommended a TEE.  This was discussed with patient in detail however he is adamant about leaving tomorrow not staying another day.  He states he is willing to follow-up with a TEE on an outpatient basis. -Plan to discuss with ID tomorrow, current course of antibiotics can treat endocarditis.  Leukocytosis Uptrending white blood cell of the past 3 days.  I suspect that this is secondary to reactive cytosis from his I&D.   CKD stage IIIa Creatinine at baseline at this time.  Diet: Normal IVF: None,None VTE: Enoxaparin Code: Full code  Dispo: Anticipated discharge to Home tomorrow with follow-up with PCP and ID  Sanjuana Letters DO Internal Medicine Resident PGY-2 Pager [phone] Please contact the on call pager after 5 pm and on weekends at [phone].

## 2021-10-19 NOTE — Discharge Summary (Incomplete)
Name: Bill [name] MRN: [id number] DOB: 08-29-1956 66 y.o. PCP: [facility]  Date of Admission: 10/15/2021  8:33 AM Date of Discharge:  10/19/2021 Attending Physician: Dr.  Lafonda Mosses  DISCHARGE DIAGNOSIS:  Primary Problem: Postoperative wound infection   Hospital Problems: Principal Problem:   Postoperative wound infection Active Problems:   Ankylosing spondylitis (HCC)   Hypertension   Hyperlipidemia   Chronic renal insufficiency   Coronary artery disease   OSA (obstructive sleep apnea)   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HCC)    DISCHARGE MEDICATIONS:   Allergies as of 10/19/2021       Reactions   Amlodipine Swelling   Duloxetine    Other reaction(s): MALE ERECTILE DISORDER     Med Rec must be completed prior to using this SMARTLINK***       DISPOSITION AND FOLLOW-UP:  Bill [name] was discharged from [facility] in Stable condition. 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 Please monitor for completion of IV and oral antibiotics, end date 11/12/2021.   AKI on CKD Elevated liver enzymes; resolved Please check CMP to monitor for continued resolution of abnormal labs.A    Prediabetes Counsel on lifestyle modifications.   HTN Patient hypertensive during admission despite home metoprolol and lisinopril-HCTZ. Consider adjustments to his antihypertensive management.    HLD OSA Chronic conditions. Continued home management.  Follow-up Recommendations: Consults: Infectious disease, neurosurgery Labs: CBC and Comprehensive Metabolic Panel Studies: None Medications: IV cefazolin 2 g three times daily, rifampin 300 mg by mouth twice daily  Follow-up Appointments:  Follow-up Information     [facility] up on 10/24/2021.   Why: At 11:30. Contact  information: [address] 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  He initially presented with a complaint of fever with T-max of 102 as well as increased pain and drainage at surgical site.  MRI on admission showed a large fluid collection at the surgical site measuring 9 x 8 x 8 cm.  Patient underwent incision and drainage 01/28 by neurosurgery and was found to have significant purulence, fascial dehiscence, necrosis at the surgical site.  A wound VAC was placed at that time. He was initiated on vancomycin and cefepime on 01/28.  Gram stain of fluid from the wound grew gram-positive cocci and culture showed staph aureus.  On 01/30 cefepime was discontinued.  Patient had a PICC line placed for outpatient antibiotic therapy.  Blood culture resulted positive for staph aureus in 1/4 bottles on 01/31. Vancomycin was discontinued at that time with initiation of cefazolin 2g TID. Of note patient did have gradual increase of leukocytosis with WBC count of 16.1 on day of discharge; he was afebrile and hemodynamically stable throughout admission. RUE ultrasound to rule out SVT or DVT given erythema and tenderness around previous peripheral IV site which was negative. Echocardiogram was obtained to rule out vegetations and showed ***. He was discharged with PICC line in place for cefazolin 2 g TID and will also be treated with rifampin 300 mg PO BID. He will receive 8 weeks of therapy with last day of 11/12/2021.   Ankylosing spondylitis Chronic condition.  Home medications of etanercept and methotrexate held during this admission.  Folic acid was continued.   AKI on CKD Admission creatinine of 2.68.  This gradually improved throughout his admission with creatinine of 1.36  on day of discharge.   Elevated liver enzymes; resolved Admission labs revealed AST of 94, ALT of 83, total bilirubin of 2.7.  These values resolved to normal limits throughout admission and were within normal limits prior to discharge.   Prediabetes HbA1c noted to be 5.8%.   HTN Chronic condition.  Home medications initially held.  Metoprolol restarted 01/29, lisinopril-HCTZ restarted 01/30.    HLD Chronic condition. Continued home atorvastatin.   OSA Chronic condition.  CPAP nightly.   DISCHARGE INSTRUCTIONS:   Discharge Instructions     Incentive spirometry RT   Complete by: As directed        SUBJECTIVE:  Patient evaluated at bedside on day of discharge. He has no complaints or concerns and is eager to return home.   Discharge Vitals:   BP 124/73 (BP Location: Right Arm)    Pulse 88    Temp 98.2 F (36.8 C) (Oral)    Resp 16    Ht 5\' 10"  (1.778 m)    Wt 134.4 kg    SpO2 95%    BMI 42.53 kg/m   OBJECTIVE:  Constitutional: Well appearing gentleman resting in bedside recliner. No acute distress noted. Cardio: Regular rate and rhythm. No murmurs, rubs, gallops. Pulm: Clear to auscultation bilaterally.  MSK: Negative for extremity edema. Skin: Skin is warm and dry.  Clean wound vac over lower cervical and upper thoracic spine levels without increased warmth or erythema.  Neuro: Alert and oriented x3. No focal deficit noted. Psych: Normal mood and affect.    Pertinent Labs, Studies, and Procedures:  CBC Latest Ref Rng & Units 10/19/2021 10/18/2021 10/17/2021  WBC 4.0 - 10.5 K/uL 16.1(H) 14.1(H) 13.8(H)  Hemoglobin 13.0 - 17.0 g/dL 10.7(L) 10.2(L) 10.0(L)  Hematocrit 39.0 - 52.0 % 32.9(L) 31.0(L) 30.9(L)  Platelets 150 - 400 K/uL 221 218 229    CMP Latest Ref Rng & Units 10/19/2021 10/18/2021 10/17/2021  Glucose 70 - 99 mg/dL 110(H) 94 134(H)  BUN 8 - 23 mg/dL 21 26(H) 34(H)  Creatinine 0.61 - 1.24 mg/dL 1.36(H) 1.32(H) 1.41(H)  Sodium 135 - 145 mmol/L 136 137 137   Potassium 3.5 - 5.1 mmol/L 3.5 3.5 4.5  Chloride 98 - 111 mmol/L 100 102 104  CO2 22 - 32 mmol/L 27 25 25   Calcium 8.9 - 10.3 mg/dL 8.6(L) 8.5(L) 8.7(L)  Total Protein 6.5 - 8.1 g/dL - - 6.4(L)  Total Bilirubin 0.3 - 1.2 mg/dL - - 0.8  Alkaline Phos 38 - 126 U/L - - 107  AST 15 - 41 U/L - - 32  ALT 0 - 44 U/L - - 42    MR Cervical Spine Wo Contrast  Result Date: 10/15/2021 CLINICAL DATA:  Increased drainage at surgical site a posterior neck. Fluid collection seen on outside CT. Fever, leukocytosis. Elevated serum inflammatory markers. EXAM: MRI CERVICAL SPINE WITHOUT CONTRAST TECHNIQUE: Multiplanar, multisequence MR imaging of the cervical spine was performed. No intravenous contrast was administered. COMPARISON:  CT 10/14/2021 FINDINGS: Alignment: Straightening of cervical lordosis. Minimal anterolisthesis at C2-3. Vertebrae: Postsurgical changes from C3-C7 ACDF with associated metallic susceptibility artifact. Also changes of recent posterior spinal fusion at C5-C7. No acute fracture. No evidence of discitis. No marrow replacing bone lesion identified. Cord: No focal cord signal abnormality is identified. No appreciable epidural  fluid collection. Posterior Fossa, vertebral arteries, paraspinal tissues: Large fluid collection at the operative bed of the posterior lower cervical spine extending to the overlying skin surface along the surgical incision site. Collection measures approximately 9.0 x 8.0 x 8.0 cm. Multiple foci of susceptibility artifact within the collection likely a represent a combination of micrometallic postsurgical change and air. Collection does not extend into the lateral or anterior paraspinal soft tissues. Disc levels: C2-C3: Disc osteophyte complex with right worse than left facet arthropathy and uncovertebral spurring resulting in moderate-severe right foraminal stenosis and mild canal stenosis. C3-C4: Prior fusion. Mild canal stenosis predominantly related to intrinsic canal  narrowing. No foraminal stenosis. C4-C5: Prior fusion. Mild canal stenosis predominantly related to intrinsic canal narrowing. No foraminal stenosis. C5-C6: Prior fusion. Mild canal stenosis predominantly related to intrinsic canal narrowing. Suspect mild bilateral foraminal stenosis, right slightly worse than left. C6-C7: Prior fusion. Mild canal stenosis predominantly related to intrinsic canal narrowing. Suspect mild bilateral foraminal stenosis, left slightly worse than right. C7-T1: No significant disc protrusion, foraminal stenosis, or canal stenosis. IMPRESSION: 1. Large fluid collection at the operative bed of the posterior lower cervical spine extending to the overlying skin surface along the surgical incision site. Collection measures approximately 9.0 x 8.0 x 8.0 cm. Multiple foci of susceptibility artifact within the collection likely represent a combination of micrometallic postsurgical change and air. Appearance most compatible with a postoperative seroma or hematoma. Superimposed infection cannot be differentiated by imaging alone. 2. No evidence of epidural fluid collection. 3. Cervical spondylosis with multifactorial mild canal stenosis at the C2-3 level predominantly related to intrinsic canal narrowing. There is also moderate-severe right foraminal stenosis at C2-3. Electronically Signed   By: Davina Poke D.O.   On: 10/15/2021 12:44     Signed: Farrel Gordon, D.O.  Internal Medicine Resident, PGY-1 [facility]  Pager: [phone] 4:07 PM, 10/19/2021

## 2021-10-19 NOTE — Consult Note (Signed)
WOC Nurse Consult Note: Patient receiving care in [facility] [id number] Wound type: Surgical cervical incision  Pressure Injury POA: NA Measurement: 8.5 cm x 4.5 cm x 5.5 cm with undermining from ten o'clock to eight o'clock that measures 5.5 cm and undermining from two o'clock to 5 o'clock that measures 5.5 cm Wound bed: Pink and yellow down to the bone, no bleeding upon removal of dressing. Granulation tissue beginning to form around the outer/inner edges of the wound.   Drainage (amount, consistency, odor) Serousanguinous in canister Periwound: intact Dressing procedure/placement/frequency: Removed 2 pieces of black foam, replaced 2 pieces of black foam, drape applied and immediate suction obtained at 125 mmHg.  Dressing to be changed MWF by [facility] nurse. Patient to be discharged today pending home vac delivery and approval from the [place]   Monitor the wound area(s) for worsening of condition such as: Signs/symptoms of infection, increase in size, development of or worsening of odor, development of pain, or increased pain at the affected locations.   Notify the medical team if any of these develop.   Please re-consult the WOC team if needed.   [name], MSN, RN, CMSRN, [name], [facility] Wound Treatment Associate Pager [phone]

## 2021-10-19 NOTE — Progress Notes (Signed)
Mobility Specialist Progress Note   10/19/21 1100  Mobility  Activity Ambulated independently in hallway  Level of Assistance Modified independent, requires aide device or extra time  Assistive Device None (IV pole)  Distance Ambulated (ft) 1100 ft  Activity Response Tolerated well  $Mobility charge 1 Mobility   Received pt in chair having no complaints and agreeable to mobility. Slight c/o L shoulder pain(5/10) but tolerable, returned back to chair w/ call bell in reach and all needs met.  Holland Falling Mobility Specialist Phone Number [phone]

## 2021-10-19 NOTE — Progress Notes (Addendum)
[facility]    Date of Admission:  10/15/2021   Total days of antibiotics 5           ID: Bill [name] is a 66 y.o. male with  cervical fusion MSSA surgical site infection with secondary bacteremia Principal Problem:   Postoperative wound infection Active Problems:   Ankylosing spondylitis (HCC)   Hypertension   Hyperlipidemia   Chronic renal insufficiency   Coronary artery disease   OSA (obstructive sleep apnea)   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HCC)    Subjective: Afebrile. Still having right forearm pain ROS: 12 point ros is otherwise negative  Medications:   allopurinol  300 mg Oral Daily   aspirin EC  81 mg Oral Daily   atorvastatin  20 mg Oral QHS   Chlorhexidine Gluconate Cloth  6 each Topical Daily   cholecalciferol  2,000 Units Oral QPM   enoxaparin (LOVENOX) injection  40 mg Subcutaneous Q24H   folic acid  1 mg Oral QPM   hydrochlorothiazide  12.5 mg Oral Daily   lisinopril  20 mg Oral Daily   loratadine  10 mg Oral Daily   metoprolol tartrate  50 mg Oral Daily   montelukast  10 mg Oral QHS   rifampin  300 mg Oral Q12H   senna-docusate  1 tablet Oral QHS   sodium chloride flush  3 mL Intravenous Q12H    Objective: Vital signs in last 24 hours: Temp:  [98.5 F (36.9 C)-99.1 F (37.3 C)] 99.1 F (37.3 C) (02/01 0721) Pulse Rate:  [82-96] 96 (02/01 0721) Resp:  [16-18] 16 (02/01 0721) BP: (129-146)/(67-78) 146/78 (02/01 0721) SpO2:  [94 %-98 %] 94 % (02/01 0721) Physical Exam  Constitutional: He is oriented to person, place, and time. He appears well-developed and well-nourished. No distress.  HENT:  Mouth/Throat: Oropharynx is clear and moist. No oropharyngeal exudate.  Cardiovascular: Normal rate, regular rhythm and normal heart sounds. Exam reveals no gallop and no friction rub.  No murmur heard.  Back = wound vac in place in posterior cervical region Pulmonary/Chest: Effort normal and breath sounds normal. No respiratory  distress. He has no wheezes.  Abdominal: Soft. Bowel sounds are normal. He exhibits no distension. There is no tenderness.  Ext: left arm picc line c/d/i Neurological: He is alert and oriented to person, place, and time.  Skin: Skin is warm and dry. No rash noted. No erythema.  Psychiatric: He has a normal mood and affect. His behavior is normal.    Lab Results Recent Labs    10/18/21 0429 10/18/21 0828 10/19/21 0123  WBC  --  14.1* 16.1*  HGB  --  10.2* 10.7*  HCT  --  31.0* 32.9*  NA 137  --  136  K 3.5  --  3.5  CL 102  --  100  CO2 25  --  27  BUN 26*  --  21  CREATININE 1.32*  --  1.36*   Liver Panel Recent Labs    10/17/21 0346  PROT 6.4*  ALBUMIN 2.3*  AST 32  ALT 42  ALKPHOS 107  BILITOT 0.8     Microbiology: reviewed Studies/Results: [place] EKG SITE RITE  Result Date: 10/17/2021 If Site Rite image not attached, placement could not be confirmed due to current cardiac rhythm.    Assessment/Plan: Right forearm tenderness at previous PIV site = recommend getting U/S to evaluate for SVT or DVT  MSSA cervical fusion SSI = continue with cefazolin with  rifampin. Plan for 8 wks. Noticing some nausea with abtx. Wil check sed rate and crp  MSSA bacteremia = 1 of 4 bottles from admit; likely secondary from [place] site. Continue on cefazolin  Will arrange for follow up in ID clinic in 4 wk  [facility] Pager: [phone]  10/19/2021, 10:02 AM

## 2021-10-19 NOTE — TOC Progression Note (Addendum)
Transition of Care [facility]) - Progression Note    Patient Details  Name: Bill [name] MRN: [id number] Date of Birth: 05/14/1956  [facility] CM/SW Contact  Nadene Rubins Adria Devon, RN Phone Number: 10/19/2021, 8:10 AM  Clinical Narrative:     Elita Quick with [employer] received authorization from [place] for IV ABX.   Awaiting authorization for [facility] and home health nursing.  NCM left [facility] social worker Ms Kelton [phone]  ext [phone] a voicemail awaiting call back.    0930 [facility] social worker Ms Kelton [phone]  ext [phone] returned call. Patient's PCP has not entered the consult for the wound VAC as of yet. NCM has faxed referral 10/17/21 , so has French Ana with [employer]. NCM has also faxed updates to PCP at [facility] 10/18/21 and 10/19/21   1550 Still waiting on authorization from [place] for [facility] and HHRN. Patient aware and will send his [facility] PCP a message.   Expected Discharge Plan: Home w Home Health Services    Expected Discharge Plan and Services Expected Discharge Plan: Home w Home Health Services     Post Acute Care Choice: Home Health Living arrangements for the past 2 months: Single Family Home                           HH Arranged: RN           Social Determinants of Health (SDOH) Interventions    Readmission Risk Interventions No flowsheet data found.

## 2021-10-19 NOTE — Progress Notes (Signed)
Patient evaluated at bedside updated him of his echocardiogram findings.  Patient had TTE performed later this evening with dissipated discharge if no vegetation seen.  Received call from cardiology that patient's echo had vegetation versus calcification on his mitral valve was difficult to discern and they recommended a TEE.  This was discussed with the patient.  He repeatedly stated that he was leaving tomorrow morning despite my recommendations of having the TEE performed.  He has home obligations such as watching his friend's dogs.  He is willing to leave and come back however he will not stay for another procedure.  We discussed with him the risks and benefits including worsening infection and longer hospital stay if this is not adequately treated and he would still like to leave tomorrow morning.  Of note he says he will call his PCP tomorrow morning to discuss this with her, I offered to have one of our team members talk with his PCP if she has any questions.

## 2021-10-19 NOTE — Progress Notes (Signed)
Upper extremity venous has been completed.   Preliminary results in CV Proc.   Jinny Blossom Phil Michels 10/19/2021 2:52 PM

## 2021-10-20 ENCOUNTER — Inpatient Hospital Stay (HOSPITAL_COMMUNITY): Payer: No Typology Code available for payment source

## 2021-10-20 ENCOUNTER — Other Ambulatory Visit (HOSPITAL_COMMUNITY): Payer: Self-pay

## 2021-10-20 DIAGNOSIS — B9561 Methicillin susceptible Staphylococcus aureus infection as the cause of diseases classified elsewhere: Secondary | ICD-10-CM

## 2021-10-20 DIAGNOSIS — I330 Acute and subacute infective endocarditis: Secondary | ICD-10-CM

## 2021-10-20 DIAGNOSIS — I129 Hypertensive chronic kidney disease with stage 1 through stage 4 chronic kidney disease, or unspecified chronic kidney disease: Secondary | ICD-10-CM

## 2021-10-20 DIAGNOSIS — T8149XA Infection following a procedure, other surgical site, initial encounter: Secondary | ICD-10-CM | POA: Diagnosis not present

## 2021-10-20 DIAGNOSIS — N179 Acute kidney failure, unspecified: Secondary | ICD-10-CM

## 2021-10-20 DIAGNOSIS — N189 Chronic kidney disease, unspecified: Secondary | ICD-10-CM

## 2021-10-20 DIAGNOSIS — M450 Ankylosing spondylitis of multiple sites in spine: Secondary | ICD-10-CM

## 2021-10-20 LAB — CULTURE, BLOOD (ROUTINE X 2)
Culture: NO GROWTH
Special Requests: ADEQUATE

## 2021-10-20 LAB — BASIC METABOLIC PANEL
Anion gap: 10 (ref 5–15)
BUN: 12 mg/dL (ref 8–23)
CO2: 27 mmol/L (ref 22–32)
Calcium: 8.6 mg/dL — ABNORMAL LOW (ref 8.9–10.3)
Chloride: 97 mmol/L — ABNORMAL LOW (ref 98–111)
Creatinine, Ser: 1.04 mg/dL (ref 0.61–1.24)
GFR, Estimated: >60 mL/min (ref >60)
Glucose, Bld: 101 mg/dL — ABNORMAL HIGH (ref 70–99)
Potassium: 3.2 mmol/L — ABNORMAL LOW (ref 3.5–5.1)
Sodium: 134 mmol/L — ABNORMAL LOW (ref 135–145)

## 2021-10-20 LAB — CBC
HCT: 33.4 % — ABNORMAL LOW (ref 39.0–52.0)
Hemoglobin: 10.8 g/dL — ABNORMAL LOW (ref 13.0–17.0)
MCH: 30.1 pg (ref 26.0–34.0)
MCHC: 32.3 g/dL (ref 30.0–36.0)
MCV: 93 fL (ref 80.0–100.0)
Platelets: 267 10*3/uL (ref 150–400)
RBC: 3.59 MIL/uL — ABNORMAL LOW (ref 4.22–5.81)
RDW: 14.3 % (ref 11.5–15.5)
WBC: 19.2 10*3/uL — ABNORMAL HIGH (ref 4.0–10.5)
nRBC: 0.1 % (ref 0.0–0.2)

## 2021-10-20 IMAGING — MR MR THORACIC SPINE WO/W CM
6 of 16 series · 15 of 48 positions shown · IV contrast (Yes GAD)
Comparison: Cervical spine MRI [DATE]

CLINICAL DATA: History of ankylosing spondylitis, recent posterior
cervical fusion, presents with fevers and drainage from surgical
site

EXAM:
MRI THORACIC AND LUMBAR SPINE WITHOUT AND WITH CONTRAST
TECHNIQUE: Multiplanar and multiecho pulse sequences of the thoracic and lumbar
spine were obtained without and with intravenous contrast.
CONTRAST:  10mL GADAVIST GADOBUTROL 1 MMOL/ML IV SOLN

[Series 2: T1 · sagittal · 3.0mm · 0.90mm/px · 1 of 12 slices shown (1 of 2)]
[im 1/12]
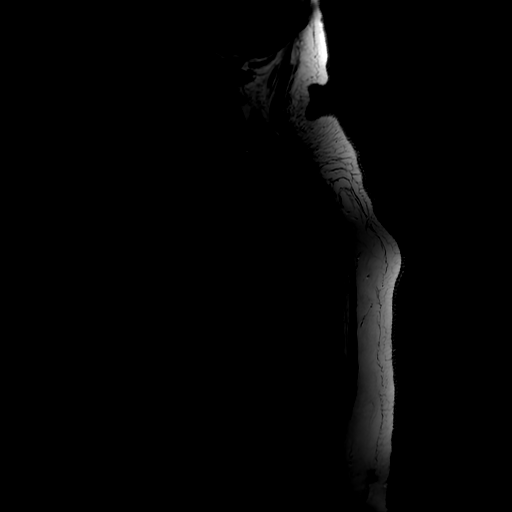

[Series 4: T2 · sagittal · 3.0mm · 0.66mm/px · 2 of 18 slices shown (1 of 4)]
[im 1/18]
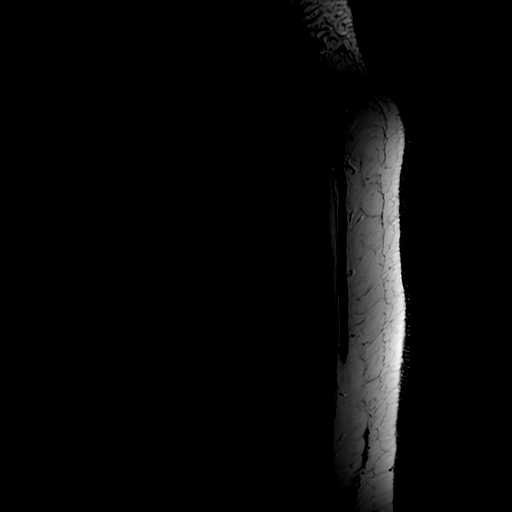
[im 18/18]
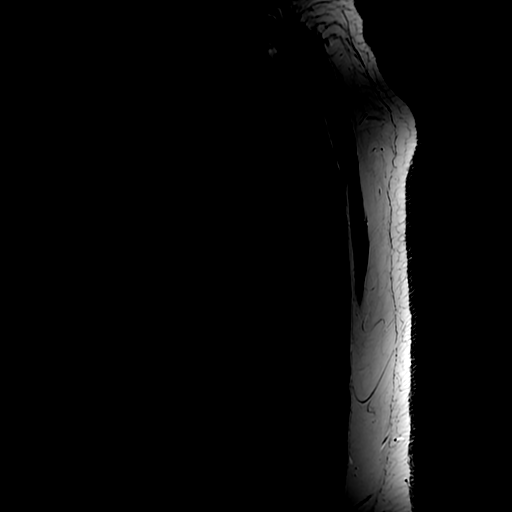

[Series 5: T1 · sagittal · 3.0mm · 0.66mm/px · 1 of 18 slices shown (2 of 2)]
[im 1/18]
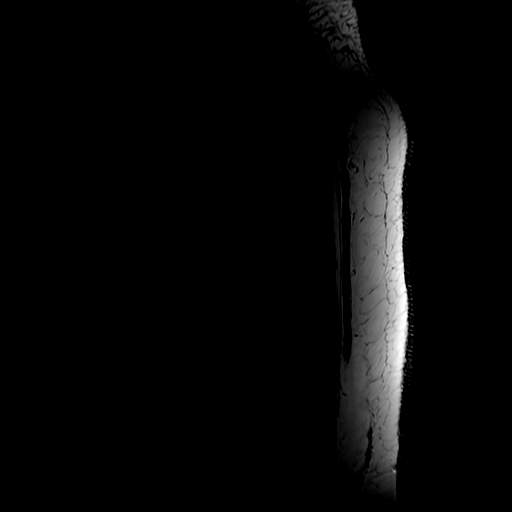

[Series 6: T2 · axial · 4.0mm · 0.39mm/px · z∈[-314,-44]mm · 5 of 44 slices shown (2 of 4)]
[im 1/44]
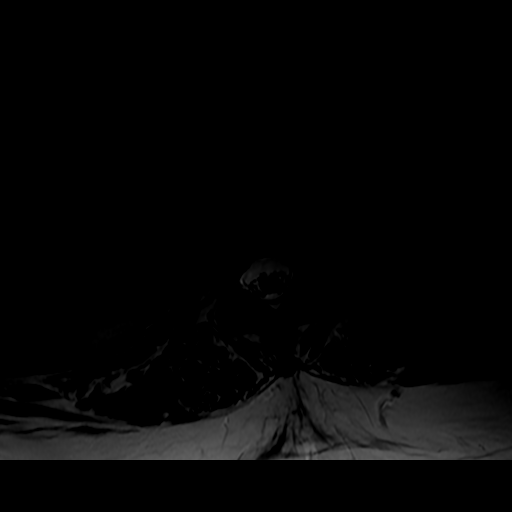
[im 11/44]
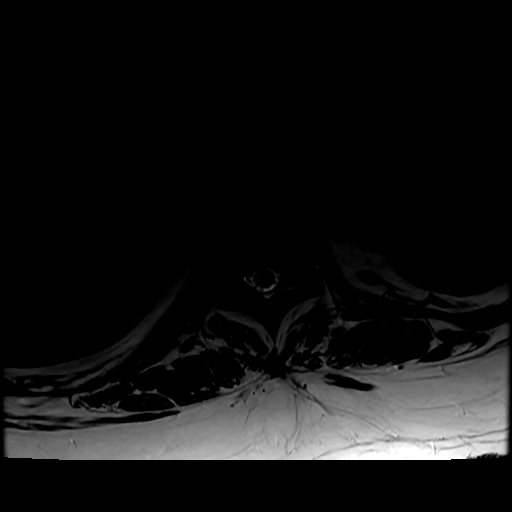
[im 22/44]
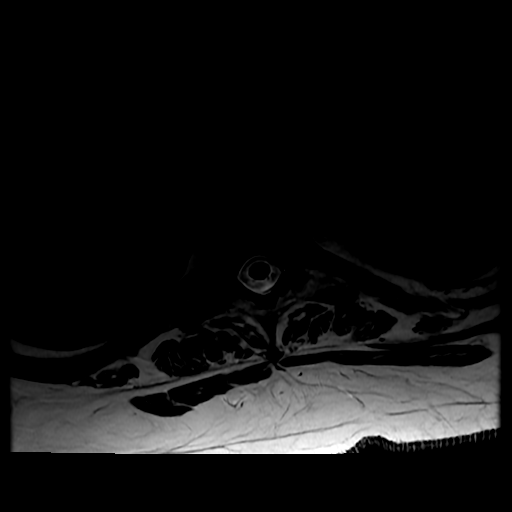
[im 33/44]
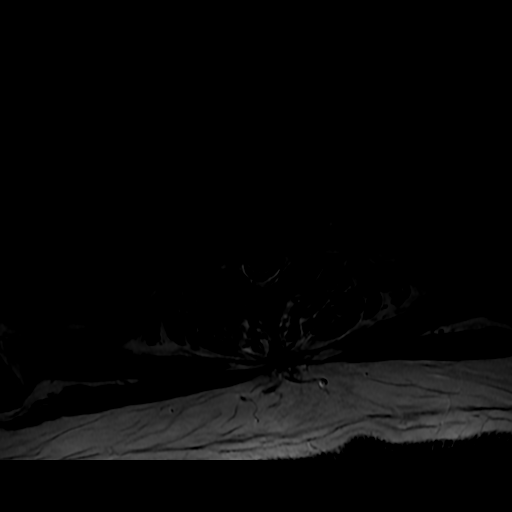
[im 44/44]
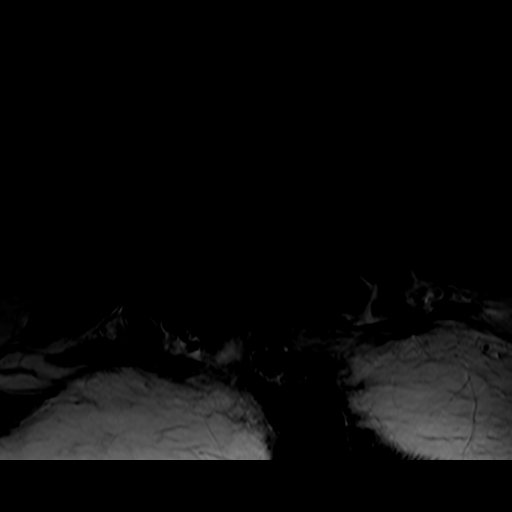

[Series 11: T2 · sagittal · 4.0mm · 0.55mm/px · 2 of 15 slices shown (3 of 4)]
[im 1/15]
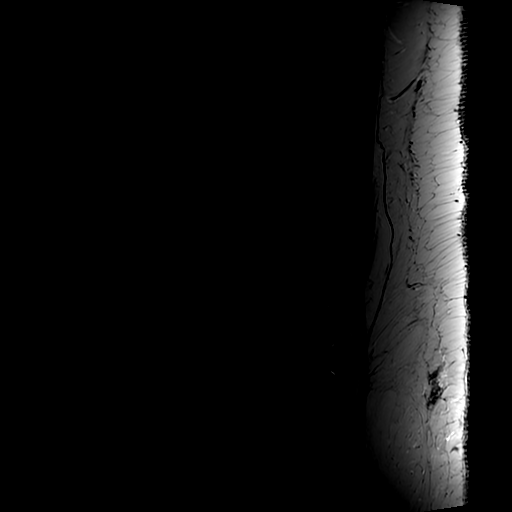
[im 15/15]
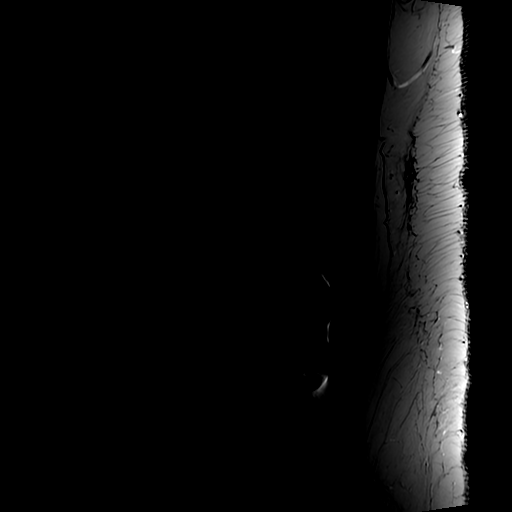

[Series 13: T2 · axial · 4.0mm · 0.39mm/px · z∈[-511,-331]mm · 4 of 37 slices shown (4 of 4)]
[im 1/37]
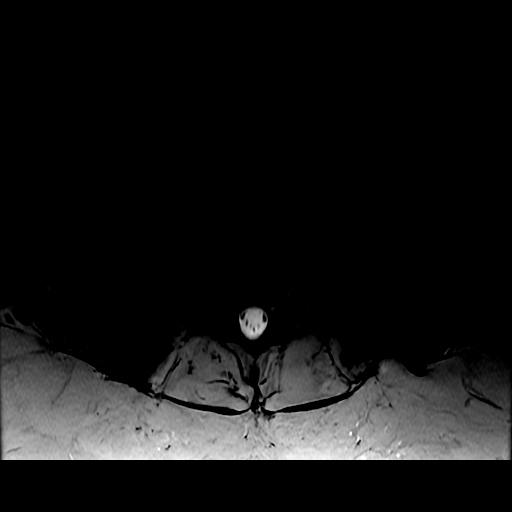
[im 13/37]
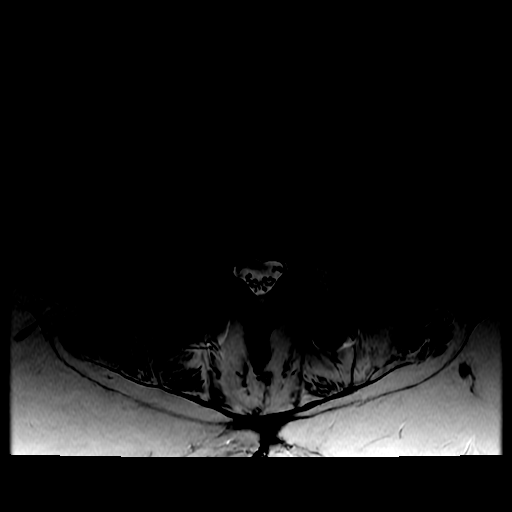
[im 25/37]
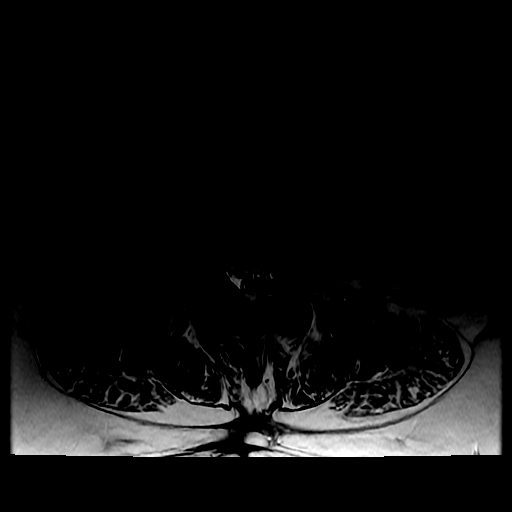
[im 37/37]
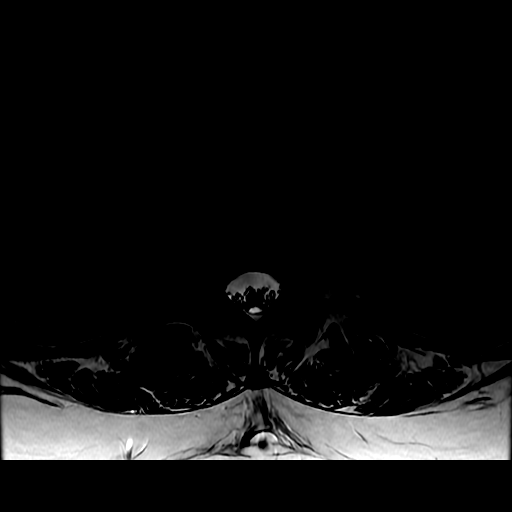

[15 of 48 positions shown; findings below may reference images not displayed]

FINDINGS: MRI THORACIC SPINE FINDINGS

Alignment:  Normal.

Vertebrae: Thoracic vertebral body heights are preserved. A focus of
T1 and T2 hyperintensity in the T6 vertebral body likely reflects an
intraosseous hemangioma. Otherwise, marrow signal is normal. There
is no suspicious marrow signal abnormality. There is no marrow
edema. There is no evidence of discitis/osteomyelitis.

Cord/canal: The thoracic cord is normal in signal and morphology.

There is thin epidural enhancing tissue along the dorsal aspect of
the canal extending from the cervical spine beyond the field-of-view
to the T2 level suspicious for epidural phlegmon. This is thickest
at the C6-C7 level where it measures up to approximately 3 mm along
the dorsal lateral aspects of the canal (17-8, 18-5). There is mild
spinal canal narrowing without significant mass effect on the cord
or cord compression. There is no convincing central non enhancement
to suggest definite abscess formation.

Paraspinal and other soft tissues: The large collection posterior to
the operative levels in the cervical spine is partially imaged on
the current study. This is better evaluated on the recent dedicated
cervical spine MRI. The paraspinal soft tissues in the thoracic
spine are unremarkable.

Disc levels:

The disc heights in the thoracic spine are overall preserved. There
are small protrusions at T3-T4, T7-T8, T8-T9, T9-T10, T10-T11, and
T11-T12. There is no significant spinal canal or neural foraminal
stenosis.

MRI LUMBAR SPINE FINDINGS

Segmentation:  Standard.

Alignment: There is straightening of the normal lumbar lordosis.
There is no antero or retrolisthesis.

Vertebrae: Postsurgical changes reflecting posterior instrumented
fusion at L4-L5 are noted. A T1 and T2 hyperintense lesion in the L3
vertebral body likely reflects an intraosseous hemangioma. There is
mild degenerative endplate marrow signal abnormality at L3-L4 and
L5-S1. An intrinsically T1 hyperintense lesion in the left iliac
wing may reflect an additional hemangioma. Marrow signal is
otherwise somewhat heterogeneous throughout without other focal or
suspicious marrow signal abnormality.

Conus medullaris: Extends to the L1-L2 level and appears normal.
There is no abnormal enhancement of the conus or cauda equina nerve
roots.

Paraspinal and other soft tissues: Unremarkable.

Disc levels:

There is multilevel disc desiccation and narrowing at the
nonsurgical levels, most advanced at L5-S1.

T12-L1: No significant spinal canal or neural foraminal stenosis

L1-L2: There is a mild diffuse disc bulge with a small inferiorly
migrated central extrusion without significant spinal canal or
neural foraminal stenosis

L2-L3: There is a mild diffuse disc bulge and mild bilateral facet
arthropathy with a small left effusion resulting in mild spinal
canal narrowing without significant neural foraminal stenosis or
evidence of nerve root impingement.

L2-L3: There is a diffuse disc bulge, ligamentum flavum thickening,
and bilateral facet arthropathy with a medially projecting right
facet joint synovial cyst measuring 6 mm resulting in moderate to
severe spinal canal stenosis with crowding of the subarticular zones
and possible impingement of either traversing L4 nerve root.
Additionally, the synovial cyst displaces the right-sided cauda
equina nerve roots (13-17).

L4-L5: Status post posterior instrumented fusion without significant
spinal canal or neural foraminal stenosis.

L5-S1: There is a diffuse disc bulge with a superimposed central
protrusion, degenerative endplate change, and bilateral facet
arthropathy resulting in narrowing of the bilateral subarticular
zones with possible irritation of either traversing S1 nerve root
and mild bilateral neural foraminal stenosis.
IMPRESSION: Thoracic spine:

1. Epidural enhancing tissue along the dorsal aspect of the canal
beginning in the cervical spine out of the field of view extending
to the T2 level suspicious for epidural phlegmon. There is no
definite central nonenhancing component to suggest abscess
formation. The phlegmon result in mild spinal canal stenosis without
significant mass effect on the cord or cord compression. Consider
postcontrast imaging of the cervical spine to assess for the
superior extent of this phlegmon.
2. Incompletely imaged fluid collection posterior to the operative
levels in the cervical spine, better evaluated on recent dedicated
cervical spine MRI.
3. No evidence of discitis/osteomyelitis in the thoracic spine.

Lumbar spine:

1. Status post posterior instrumented fusion at L4-L5 without
significant spinal canal or neural foraminal stenosis at this level.
2. Adjacent segment disease at L3-L4 resulting in moderate to severe
spinal canal stenosis with crowding of the subarticular zones and
possible impingement of either traversing L4 nerve root.
Additionally, there is a 6 mm medially projecting right facet joint
synovial cyst which displaces the right-sided cauda equina nerve
roots.
3. Diffuse disc bulge at L5-S1 resulting in narrowing of the
subarticular zones with possible irritation of either traversing S1
nerve root.
4. No evidence of discitis/osteomyelitis in the lumbar spine.

## 2021-10-20 MED ORDER — RIFAMPIN 300 MG PO CAPS
300.0000 mg | ORAL_CAPSULE | Freq: Two times a day (BID) | ORAL | 0 refills | Status: DC
  Filled 2021-10-20: qty 2, 1d supply, fill #0

## 2021-10-20 MED ORDER — GADOBUTROL 1 MMOL/ML IV SOLN
10.0000 mL | Freq: Once | INTRAVENOUS | Status: AC | PRN
  Administered 2021-10-20: 10 mL via INTRAVENOUS

## 2021-10-20 MED ORDER — SODIUM CHLORIDE 0.9 % IV SOLN
8.0000 mg/kg | Freq: Every day | INTRAVENOUS | Status: DC
  Administered 2021-10-20: 800 mg via INTRAVENOUS
  Filled 2021-10-20: qty 16

## 2021-10-20 MED ORDER — POTASSIUM CHLORIDE CRYS ER 20 MEQ PO TBCR
20.0000 meq | EXTENDED_RELEASE_TABLET | Freq: Once | ORAL | Status: AC
  Administered 2021-10-20: 20 meq via ORAL

## 2021-10-20 MED ORDER — SODIUM CHLORIDE 0.9 % IV SOLN
8.0000 mg/kg | Freq: Every day | INTRAVENOUS | Status: DC
  Filled 2021-10-20: qty 22

## 2021-10-20 NOTE — TOC Progression Note (Addendum)
Transition of Care [facility]) - Progression Note    Patient Details  Name: Bill [name] MRN: [id number] Date of Birth: 12-20-1955  Transition of Care [facility]) CM/SW Contact  Nadene Rubins Adria Devon, RN Phone Number: 10/20/2021, 10:26 AM  Clinical Narrative:     Still waiting on authorization from [facility] for [facility] and home [facility], this comes from patient's PCP at [facility].   ID Clinic at [facility] has approved IV ABX.   Patient aware of above.   However, NCM was notified by Elita Quick with [employer] this am  that patient needs new PICC and TEE.   NCM [facility]ter[facility] and will fax today's once complete. [facility] PCP DR Benjiman Core fax [phone]  Left message with [facility] SW also. Ms Anabel Halon [phone]  ext [phone]   1450 Kandee Keen with [facility] has received authorization from [place] for [facility]. French Ana with [employer] has not received authorization for home [facility], she will call [facility] again. NCM has faxed today's ID note to PCP at [facility] .  1540 Patient is discharging today , with no PICC and wet to dry dressing. Patient will come back to the ED tomorrow to be readmitted per teaching services. Pam with Ladora Daniel with Shannan Harper with [employer], ED CM's also aware.Discharge summary faxed to [facility]     Expected Discharge Plan: Home w Home Health Services    Expected Discharge Plan and Services Expected Discharge Plan: Home w Home Health Services     Post Acute Care Choice: Home Health Living arrangements for the past 2 months: Single Family Home                           HH Arranged: RN           Social Determinants of Health (SDOH) Interventions    Readmission Risk Interventions No flowsheet data found.

## 2021-10-20 NOTE — Progress Notes (Signed)
Wound vac removed and replaced with wet to dry dressing per MD order. Discharge instructions given at this time. PICC line removed earlier by IV team RN, site remains clean dry and intact.  Pt transported off unit via [place] with all belongings on self. Stable at baseline.

## 2021-10-20 NOTE — Progress Notes (Addendum)
[place] for Infectious Disease    Date of Admission:  10/15/2021   Total days of antibiotics 6/ day 3 cefazolin plus rif           ID: Bill [name] is a 66 y.o. male with MSSA cervical fusion  Principal Problem:   Postoperative wound infection Active Problems:   Ankylosing spondylitis (HCC)   Hypertension   Hyperlipidemia   Chronic renal insufficiency   Coronary artery disease   OSA (obstructive sleep apnea)   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HCC)    Subjective: Afebrile. Noticing loss of appetite with antibiotics. No problems with picc line. Noticed to be sweating with one infusion. "I need to get home for one day to arrange my affairs" -   - had vascular U/S - no DVT or SVT -underwent TTE that showed possible MV vegetation - today's labs showing increase in WBC up to 19K  He was able to view his labs from [facility] on 1/27 to show [place] 1/27 sed rate of 70 1/27 blood cx MSSA+    Medications:   allopurinol  300 mg Oral Daily   aspirin EC  81 mg Oral Daily   atorvastatin  20 mg Oral QHS   Chlorhexidine Gluconate Cloth  6 each Topical Daily   cholecalciferol  2,000 Units Oral QPM   enoxaparin (LOVENOX) injection  40 mg Subcutaneous [id number]   folic acid  1 mg Oral QPM   hydrochlorothiazide  12.5 mg Oral Daily   lisinopril  20 mg Oral Daily   loratadine  10 mg Oral Daily   metoprolol tartrate  50 mg Oral Daily   montelukast  10 mg Oral QHS   rifampin  300 mg Oral Q12H   senna-docusate  1 tablet Oral QHS   sodium chloride flush  3 mL Intravenous Q12H    Objective: Vital signs in last 24 hours: Temp:  [98.2 F (36.8 C)-98.8 F (37.1 C)] 98.8 F (37.1 C) (02/02 0918) Pulse Rate:  [88-101] 101 (02/02 0918) Resp:  [16-19] 19 (02/02 0918) BP: (124-139)/(71-73) 139/71 (02/02 0918) SpO2:  [95 %-96 %] 96 % (02/02 [id number]) Physical Exam  Constitutional: He is oriented to person, place, and time. He appears well-developed and well-nourished. No distress.  HENT:  Mouth/Throat:  Oropharynx is clear and moist. No oropharyngeal exudate.  Cardiovascular: Normal rate, regular rhythm and normal heart sounds. Exam reveals no gallop and no friction rub.  No murmur heard.  Pulmonary/Chest: Effort normal and breath sounds normal. No respiratory distress. He has no wheezes.  Abdominal: Soft. Bowel sounds are normal. He exhibits no distension. There is no tenderness.  Back: wound vac Neurological: He is alert and oriented to person, place, and time.  Skin: Skin is warm and dry. No rash noted. No erythema.  Ext: picc line is c/d/i Psychiatric: He has a normal mood and affect. His behavior is normal.   Lab Results Recent Labs    10/19/21 0123 10/20/21 0306  WBC 16.1* 19.2*  HGB 10.7* 10.8*  HCT 32.9* 33.4*  NA 136 134*  K 3.5 3.2*  CL 100 97*  CO2 27 27  BUN 21 12  CREATININE 1.36* 1.04   Liver Panel No results for input(s): PROT, ALBUMIN, AST, ALT, ALKPHOS, BILITOT, BILIDIR, IBILI in the last 72 hours. Sedimentation Rate No results for input(s): ESRSEDRATE in the last 72 hours. C-Reactive Protein No results for input(s): CRP in the last 72 hours.  Microbiology: 1/28 blood cx 1 of 4 bottles MSSA Studies/Results: ECHOCARDIOGRAM  COMPLETE  Result Date: 10/19/2021    ECHOCARDIOGRAM REPORT   Patient Name:   Bill [name] [facility] Date of Exam: 10/19/2021 Medical Rec #:  [id number]          Height:       70.0 in Accession #:    [id number]         Weight:       296.4 lb Date of Birth:  [birth year]          BSA:          2.467 m Patient Age:    56 years           BP:           146/78 mmHg Patient Gender: M                  HR:           83 bpm. Exam Location:  Inpatient Procedure: 2D Echo, Cardiac Doppler, Color Doppler and Intracardiac            Opacification Agent Indications:    Bacteremia  History:        Patient has no prior history of Echocardiogram examinations. CAD                 and Previous Myocardial Infarction; Risk Factors:Hypertension                 and  Dyslipidemia.  Sonographer:    [facility] Referring Phys: [id number] ERIK C HOFFMAN IMPRESSIONS  1. Left ventricular ejection fraction, by estimation, is 60 to 65%. The left ventricle has normal function. The left ventricle has no regional wall motion abnormalities. There is mild concentric left ventricular hypertrophy. Left ventricular diastolic parameters are consistent with Grade I diastolic dysfunction (impaired relaxation).  2. Right ventricular systolic function is normal. The right ventricular size is normal.  3. There is a highly mobile echodense mass on the posterior leaflet of the mitral valve which can be seen on parasternal long axis view ( frame 14). This is poor visualized could be calcium on the leaflet tips vs vegetation. Consider TEE if clinically indicated. Trivial mitral valve regurgitation. No evidence of mitral stenosis.  4. The aortic valve is tricuspid. Aortic valve regurgitation is not visualized. No aortic stenosis is present.  5. The inferior vena cava is normal in size with greater than 50% respiratory variability, suggesting right atrial pressure of 3 mmHg. Comparison(s): No prior Echocardiogram. Conclusion(s)/Recommendation(s): Cannot exclude vegetation on the posterior mitral leaflet. Consider transesophageal echocardiogram, if clinically indicated. FINDINGS  Left Ventricle: Left ventricular ejection fraction, by estimation, is 60 to 65%. The left ventricle has normal function. The left ventricle has no regional wall motion abnormalities. The left ventricular internal cavity size was normal in size. There is  mild concentric left ventricular hypertrophy. Left ventricular diastolic parameters are consistent with Grade I diastolic dysfunction (impaired relaxation). Right Ventricle: The right ventricular size is normal. No increase in right ventricular wall thickness. Right ventricular systolic function is normal. Left Atrium: Left atrial size was normal in size. Right Atrium: Right atrial size was  normal in size. Pericardium: There is no evidence of pericardial effusion. Mitral Valve: There is a highly mobile echodense mass on the posterior leaflet of the mitral valve which can be seen on parasternal long axis view ( frame 14). This is poor visualized could be calcium on the leaflet tips vs vegetation. Consider TEE if clinically indicated. Trivial mitral valve regurgitation. No evidence of  mitral valve stenosis. MV peak gradient, 3.7 mmHg. The mean mitral valve gradient is 1.0 mmHg. Tricuspid Valve: The tricuspid valve is normal in structure. Tricuspid valve regurgitation is not demonstrated. No evidence of tricuspid stenosis. Aortic Valve: The aortic valve is tricuspid. Aortic valve regurgitation is not visualized. No aortic stenosis is present. Aortic valve mean gradient measures 3.0 mmHg. Aortic valve peak gradient measures 6.2 mmHg. Aortic valve area, by VTI measures 3.69 cm. Pulmonic Valve: The pulmonic valve was normal in structure. Pulmonic valve regurgitation is not visualized. No evidence of pulmonic stenosis. Aorta: The aortic root is normal in size and structure. Venous: The inferior vena cava is normal in size with greater than 50% respiratory variability, suggesting right atrial pressure of 3 mmHg. IAS/Shunts: No atrial level shunt detected by color flow Doppler.  LEFT VENTRICLE PLAX 2D LVIDd:         4.60 cm     Diastology LVIDs:         2.40 cm     LV e' medial:    7.94 cm/s LV PW:         1.35 cm     LV E/e' medial:  8.8 LV IVS:        1.10 cm     LV e' lateral:   8.92 cm/s LVOT diam:     2.40 cm     LV E/e' lateral: 7.8 LV SV:         80 LV SV Index:   32 LVOT Area:     4.52 cm  LV Volumes (MOD) LV vol d, MOD A2C: 67.9 ml LV vol d, MOD A4C: 87.6 ml LV vol s, MOD A2C: 11.3 ml LV vol s, MOD A4C: 27.9 ml LV SV MOD A2C:     56.6 ml LV SV MOD A4C:     87.6 ml LV SV MOD BP:      59.3 ml RIGHT VENTRICLE RV Basal diam:  3.50 cm RV Mid diam:    2.20 cm RV S prime:     23.50 cm/s TAPSE (M-mode): 2.2  cm LEFT ATRIUM             Index        RIGHT ATRIUM           Index LA diam:        3.20 cm 1.30 cm/m   RA Area:     10.60 cm LA Vol (A2C):   42.1 ml 17.06 ml/m  RA Volume:   21.50 ml  8.71 ml/m LA Vol (A4C):   62.5 ml 25.33 ml/m LA Biplane Vol: 54.0 ml 21.89 ml/m  AORTIC VALVE                    PULMONIC VALVE AV Area (Vmax):    3.72 cm     PV Vmax:       0.99 m/s AV Area (Vmean):   3.69 cm     PV Vmean:      76.700 cm/s AV Area (VTI):     3.69 cm     PV VTI:        0.213 m AV Vmax:           124.00 cm/s  PV Peak grad:  3.9 mmHg AV Vmean:          83.600 cm/s  PV Mean grad:  3.0 mmHg AV VTI:            0.216 m AV Peak  Grad:      6.2 mmHg AV Mean Grad:      3.0 mmHg LVOT Vmax:         102.00 cm/s LVOT Vmean:        68.100 cm/s LVOT VTI:          0.176 m LVOT/AV VTI ratio: 0.81  AORTA Ao Asc diam: 3.30 cm MITRAL VALVE               TRICUSPID VALVE MV Area (PHT): 3.50 cm    TR Peak grad:   39.2 mmHg MV Area VTI:   4.04 cm    TR Vmax:        313.00 cm/s MV Peak grad:  3.7 mmHg MV Mean grad:  1.0 mmHg    SHUNTS MV Vmax:       0.97 m/s    Systemic VTI:  0.18 m MV Vmean:      52.9 cm/s   Systemic Diam: 2.40 cm MV Decel Time: 217 msec MV E velocity: 69.80 cm/s MV A velocity: 73.70 cm/s MV E/A ratio:  0.95 Kardie Tobb DO Electronically signed by Berniece Salines DO Signature Date/Time: 10/19/2021/4:43:21 PM    Final    VAS [place] UPPER EXTREMITY VENOUS DUPLEX  Result Date: 10/19/2021 UPPER VENOUS STUDY  Patient Name:  Bill [name]  Date of Exam:   10/19/2021 Medical Rec #: [id number]           Accession #:    [id number] Date of Birth: 01/02/1956           Patient Gender: M Patient Age:   5 years Exam Location:  [facility] Procedure:      VAS [place] UPPER EXTREMITY VENOUS DUPLEX Referring Phys: JULIE MACHEN --------------------------------------------------------------------------------  Indications: tenderness Comparison Study: no prior Performing Technologist: Archie Patten RVS  Examination Guidelines: A  complete evaluation includes B-mode imaging, spectral Doppler, color Doppler, and power Doppler as needed of all accessible portions of each vessel. Bilateral testing is considered an integral part of a complete examination. Limited examinations for reoccurring indications may be performed as noted.  Right Findings: +----------+------------+---------+-----------+----------+-------+  RIGHT      Compressible Phasicity Spontaneous Properties Summary  +----------+------------+---------+-----------+----------+-------+  IJV            Full        Yes        Yes                         +----------+------------+---------+-----------+----------+-------+  Subclavian     Full        Yes        Yes                         +----------+------------+---------+-----------+----------+-------+  Axillary       Full        Yes        Yes                         +----------+------------+---------+-----------+----------+-------+  Brachial       Full        Yes        Yes                         +----------+------------+---------+-----------+----------+-------+  Radial         Full                                               +----------+------------+---------+-----------+----------+-------+  Ulnar          Full                                               +----------+------------+---------+-----------+----------+-------+  Cephalic       Full                                               +----------+------------+---------+-----------+----------+-------+  Basilic        Full                                               +----------+------------+---------+-----------+----------+-------+  Left Findings: +----------+------------+---------+-----------+----------+-------+  LEFT       Compressible Phasicity Spontaneous Properties Summary  +----------+------------+---------+-----------+----------+-------+  Subclavian                 Yes        Yes                         +----------+------------+---------+-----------+----------+-------+   Summary:  Right: No evidence of deep vein thrombosis in the upper extremity. No evidence of superficial vein thrombosis in the upper extremity.  Left: No evidence of thrombosis in the subclavian.  *See table(s) above for measurements and observations.  Diagnosing physician: Jamelle Haring Electronically signed by Jamelle Haring on 10/19/2021 at 3:57:02 PM.    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 On appropriate abtx however WBC increased to 19K.  - currently cefazolin plus rifampin 300mg  bid x 8 wks however still needs work up of TTE findings, his leukocytosis - will plan to pull picc line (placed before documented clearance of bacteremia)and repeat blood cx after picc line removal (can repeat blood cx tomorrow-on readmission) - also will give a dose of daptomycin at 8mg /kg to tie over until he gets discharged tonight and readmitted tomorrow afternoon - will need to get TEE to evaluate for endocarditis (meets criteria due to + blood cx from [place] on 1/27, and then on admit 1/28, plus TTE+) - also recommend MRI of thoracic and lumbar spine to see other nidus of infection  Patient is agreeable to the plan above. He recognizes that we would like to have him stay to complete course of work up but states that he needs to go home to attend to financial obligations/payment.   Spent 86min with patient, coordinating care with primary team and vascular picc team  [facility] Pager: [phone]  10/20/2021, 1:02 PM

## 2021-10-20 NOTE — Progress Notes (Signed)
Mobility Specialist Progress Note   10/20/21 1211  Mobility  Activity Refused mobility   Pt wanting to rest before MRI. Will not f/u after. Pt mobilizing well w/ appropriate functionality.   Frederico Hamman Mobility Specialist Phone Number [phone]

## 2021-10-20 NOTE — Progress Notes (Signed)
Ok to pause/clamp wound vac for MRI per Dr. Bary Richard. [place] clamped at this time for transport.

## 2021-10-20 NOTE — Discharge Summary (Signed)
Name: Bill [name] MRN: [id number] DOB: 1956-08-01 66 y.o. PCP: [facility], [facility]  Date of Admission: 10/15/2021  8:33 AM Date of Discharge:  10/20/2021 Attending Physician: Dr.  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   aspirin EC 81 MG tablet Take 1 tablet (81 mg total) by mouth daily. Swallow whole. Restart on 12/14/20   atorvastatin 20 MG tablet Commonly known as: LIPITOR Take 20 mg by mouth at bedtime.   cetirizine 10 MG tablet Commonly known as: ZYRTEC Take 10 mg by mouth at bedtime.   colchicine 0.6 MG tablet Take 0.6 mg by mouth daily as needed (Gout).   etanercept 50 MG/ML injection Commonly known as: ENBREL Inject 50 mg into the skin every Saturday.   folic acid 1 MG tablet Commonly known as: FOLVITE Take 1 mg by mouth every evening.   lisinopril-hydrochlorothiazide 20-12.5 MG tablet Commonly known as: ZESTORETIC Take 1 tablet by mouth daily.   methotrexate 2.5 MG tablet Commonly known as: RHEUMATREX Take 15 mg by mouth every Wednesday. Caution:Chemotherapy. Protect from light.   metoprolol tartrate 50 MG tablet Commonly known as: LOPRESSOR Take 50 mg by mouth daily.   montelukast 10 MG tablet Commonly known as: SINGULAIR Take 10 mg by mouth at bedtime.    oxyCODONE 5 MG immediate release tablet Commonly known as: Oxy IR/ROXICODONE Take 1 tablet (5 mg total) by mouth every 4 (four) hours as needed for moderate pain ((score 4 to 6)).   rifampin 300 MG capsule Commonly known as: RIFADIN Take 1 capsule (300 mg total) by mouth every 12 (twelve) hours.   Vitamin D 50 MCG (2000 UT) tablet Take 2,000 Units by mouth every evening.               Discharge Care Instructions  (From admission, onward)           Start     Ordered   10/20/21 0000  Change dressing on IV access line weekly and PRN  (Home infusion instructions - [facility] )        10/20/21 1518            DISPOSITION AND FOLLOW-UP:  [name] was discharged from [facility] in Stable condition. At the hospital follow up visit please address:   Follow-up Recommendations: Consults: Neurosurgery Labs: None Studies: None Medications: Two doses of rifampin sent in to cover patient between overnight discharge and readmission 02/03.  Follow-up Appointments:  Follow-up Information     [facility] Follow up on 10/24/2021.   Why: At 11:30. 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  He initially presented with a complaint of fever with T-max of 102 as well as increased pain and drainage at surgical site.  MRI on admission showed a large fluid collection at the surgical site measuring 9 x 8 x 8 cm.  Patient underwent incision and drainage 01/28 by neurosurgery and was found to have significant purulence, fascial dehiscence, necrosis at the surgical site.  A wound VAC was placed at that time. He was initiated on vancomycin and cefepime on 01/28.  Gram  stain of fluid from the wound grew gram-positive cocci and culture showed staph aureus.  On 01/30 cefepime was discontinued.  Patient had a PICC line placed for outpatient antibiotic therapy.  Blood culture resulted positive for staph aureus in 1/4 bottles on 01/31. Vancomycin was discontinued at that time with initiation of cefazolin 2g TID. Of note patient did have gradual increase of leukocytosis with WBC count of 16.1 on day of discharge; he was afebrile and hemodynamically stable throughout admission. RUE ultrasound to rule out SVT or DVT given erythema and tenderness around previous peripheral IV site which was negative. Echocardiogram was obtained to rule out vegetations and showed a highly mobile echodense mass on the posterior leaflet of the mitral valve concerning for calcification vs. Vegetation, recommended for TEE.  Patient prefers to have procedure as outpatient.  On day of discharge patient had progressive leukocytosis further to 19.1. He was discharged after PICC line removed. He will need to receive 8 weeks of therapy with last day of 11/12/2021.   Ankylosing spondylitis Chronic condition.  Home medications of etanercept and methotrexate held during this admission.  Folic acid was continued.   AKI on CKD Admission creatinine of 2.68.  This gradually improved throughout his admission with creatinine of 1.04 on day of discharge.   Elevated liver enzymes; resolved Admission labs revealed AST of 94, ALT of 83, total bilirubin of 2.7.  These values resolved to normal limits throughout admission and were within normal limits prior to discharge.   Prediabetes HbA1c noted to be 5.8%.   HTN Chronic condition.  Home medications initially held.  Metoprolol restarted 01/29, lisinopril-HCTZ restarted 01/30.    HLD Chronic condition. Continued home atorvastatin.   OSA Chronic condition.  CPAP nightly.   DISCHARGE INSTRUCTIONS:   Discharge Instructions     [facility]  pharmacist to adjust dose for Vancomycin, Aminoglycosides and other anti-infective therapies as requested by physician.   Complete by: As directed    [facility] to provide Cath Flo 85m   Complete by: As directed    Administer for PICC line occlusion and as ordered by physician for other access device issues.   Anaphylaxis Kit: Provided to treat any anaphylactic reaction to the medication being provided to the patient if First Dose or when requested by physician   Complete by: As directed    Epinephrine 138mml vial / amp: Administer 0.51m51m0.51ml40mubcutaneously once for moderate to severe anaphylaxis, nurse to call physician and pharmacy when reaction occurs and call 911 if needed for immediate care   Diphenhydramine 50mg60mIV vial: Administer 25-50mg 57mM PRN for first dose reaction, rash, itching, mild reaction, nurse to call physician and pharmacy when reaction occurs   Sodium Chloride 0.9% NS 500ml I32mdminister if needed for hypovolemic blood pressure drop or as ordered by physician after call to physician with anaphylactic reaction   Call MD  for:  persistant nausea and vomiting   Complete by: As directed    Call MD for:  redness, tenderness, or signs of infection (pain, swelling, redness, odor or green/yellow discharge around incision site)   Complete by: As directed    Call MD for:  severe uncontrolled pain   Complete by: As directed    Call MD for:  temperature >100.4   Complete by: As directed    Change dressing on IV access line weekly and PRN   Complete by: As directed    Diet - low sodium heart healthy   Complete by: As directed    Flush IV access with Sodium Chloride 0.9% and Heparin 10 units/ml or 100 units/ml   Complete by: As directed    Home infusion instructions - [facility]   Complete by: As directed    Instructions: Flush IV access with Sodium Chloride 0.9% and Heparin 10units/ml or 100units/ml   Change dressing on IV access line: Weekly and  PRN   Instructions Cath Flo 35m: Administer for PICC Line occlusion and as ordered by physician for other access device   [facility] pharmacist to adjust dose for: Vancomycin, Aminoglycosides and other anti-infective therapies as requested by physician   Incentive spirometry RT   Complete by: As directed    Increase activity slowly   Complete by: As directed    Method of administration may be changed at the discretion of home infusion pharmacist based upon assessment of the patient and/or caregivers ability to self-administer the medication ordered   Complete by: As directed        SUBJECTIVE:  Patient evaluated at bedside on day of discharge. The risks of leaving overnight were discussed with the patient. At this time the plan is for him to complete morning dose of cefazolin, have PICC removed, and undergo MRI lumbar and thoracic spine. Once complete, given lack of requirement for emergent intervention for MRI findings, the patient will discharge home and return for readmission tomorrow 10/20/2021 in anticipation of TEE and for further medical management.  Discharge Vitals:   BP 139/71 (BP Location: Right Arm)    Pulse (!) 101    Temp 98.8 F (37.1 C) (Oral)    Resp 19    Ht 5' 10"  (1.778 m)    Wt 134.4 kg    SpO2 96%    BMI 42.53 kg/m   OBJECTIVE:  Constitutional: Well appearing gentleman resting in bedside recliner. No acute distress noted. Cardio: Regular rate and rhythm. No murmurs, rubs, gallops. Pulm: Clear to auscultation bilaterally.  MSK: Negative for extremity edema. Skin: Skin is warm and dry.  Clean wound vac over lower cervical and upper thoracic spine levels without increased warmth or erythema.  Neuro: Alert and oriented x3. No focal deficit noted. Psych: Normal mood and affect.      Pertinent Labs, Studies, and Procedures:  CBC Latest Ref Rng & Units 10/20/2021 10/19/2021 10/18/2021  WBC 4.0 - 10.5 K/uL 19.2(H) 16.1(H) 14.1(H)  Hemoglobin 13.0 - 17.0 g/dL  10.8(L) 10.7(L) 10.2(L)  Hematocrit 39.0 - 52.0 % 33.4(L) 32.9(L) 31.0(L)  Platelets 150 - 400 K/uL 267 221 218    CMP Latest Ref Rng & Units 10/20/2021 10/19/2021 10/18/2021  Glucose 70 - 99 mg/dL 101(H) 110(H) 94  BUN 8 - 23 mg/dL 12 21 26(H)  Creatinine 0.61 - 1.24 mg/dL 1.04 1.36(H) 1.32(H)  Sodium 135 - 145 mmol/L 134(L) 136 137  Potassium 3.5 - 5.1 mmol/L 3.2(L) 3.5 3.5  Chloride  98 - 111 mmol/L 97(L) 100 102  CO2 22 - 32 mmol/L 27 27 25   Calcium 8.9 - 10.3 mg/dL 8.6(L) 8.6(L) 8.5(L)  Total Protein 6.5 - 8.1 g/dL - - -  Total Bilirubin 0.3 - 1.2 mg/dL - - -  Alkaline Phos 38 - 126 U/L - - -  AST 15 - 41 U/L - - -  ALT 0 - 44 U/L - - -    MR Cervical Spine Wo Contrast  Result Date: 10/15/2021 CLINICAL DATA:  Increased drainage at surgical site a posterior neck. Fluid collection seen on outside CT. Fever, leukocytosis. Elevated serum inflammatory markers. EXAM: MRI CERVICAL SPINE WITHOUT CONTRAST TECHNIQUE: Multiplanar, multisequence MR imaging of the cervical spine was performed. No intravenous contrast was administered. COMPARISON:  CT 10/14/2021 FINDINGS: Alignment: Straightening of cervical lordosis. Minimal anterolisthesis at C2-3. Vertebrae: Postsurgical changes from C3-C7 ACDF with associated metallic susceptibility artifact. Also changes of recent posterior spinal fusion at C5-C7. No acute fracture. No evidence of discitis. No marrow replacing bone lesion identified. Cord: No focal cord signal abnormality is identified. No appreciable epidural fluid collection. Posterior Fossa, vertebral arteries, paraspinal tissues: Large fluid collection at the operative bed of the posterior lower cervical spine extending to the overlying skin surface along the surgical incision site. Collection measures approximately 9.0 x 8.0 x 8.0 cm. Multiple foci of susceptibility artifact within the collection likely a represent a combination of micrometallic postsurgical change and air. Collection does not  extend into the lateral or anterior paraspinal soft tissues. Disc levels: C2-C3: Disc osteophyte complex with right worse than left facet arthropathy and uncovertebral spurring resulting in moderate-severe right foraminal stenosis and mild canal stenosis. C3-C4: Prior fusion. Mild canal stenosis predominantly related to intrinsic canal narrowing. No foraminal stenosis. C4-C5: Prior fusion. Mild canal stenosis predominantly related to intrinsic canal narrowing. No foraminal stenosis. C5-C6: Prior fusion. Mild canal stenosis predominantly related to intrinsic canal narrowing. Suspect mild bilateral foraminal stenosis, right slightly worse than left. C6-C7: Prior fusion. Mild canal stenosis predominantly related to intrinsic canal narrowing. Suspect mild bilateral foraminal stenosis, left slightly worse than right. C7-T1: No significant disc protrusion, foraminal stenosis, or canal stenosis. IMPRESSION: 1. Large fluid collection at the operative bed of the posterior lower cervical spine extending to the overlying skin surface along the surgical incision site. Collection measures approximately 9.0 x 8.0 x 8.0 cm. Multiple foci of susceptibility artifact within the collection likely represent a combination of micrometallic postsurgical change and air. Appearance most compatible with a postoperative seroma or hematoma. Superimposed infection cannot be differentiated by imaging alone. 2. No evidence of epidural fluid collection. 3. Cervical spondylosis with multifactorial mild canal stenosis at the C2-3 level predominantly related to intrinsic canal narrowing. There is also moderate-severe right foraminal stenosis at C2-3. Electronically Signed   By: Davina Poke D.O.   On: 10/15/2021 12:44     Signed: Farrel Gordon, D.O.  Internal Medicine Resident, PGY-1 [facility]  Pager: [phone] 3:34 PM, 10/20/2021

## 2021-10-21 ENCOUNTER — Other Ambulatory Visit: Payer: Self-pay

## 2021-10-21 ENCOUNTER — Inpatient Hospital Stay (HOSPITAL_COMMUNITY)
Admission: EM | Admit: 2021-10-21 | Discharge: 2021-10-28 | DRG: 862 | Disposition: A | Discharge status: Home Health | Payer: No Typology Code available for payment source | Attending: Internal Medicine | Admitting: Internal Medicine

## 2021-10-21 ENCOUNTER — Encounter (HOSPITAL_COMMUNITY): Payer: Self-pay

## 2021-10-21 DIAGNOSIS — M109 Gout, unspecified: Secondary | ICD-10-CM | POA: Diagnosis present

## 2021-10-21 DIAGNOSIS — Z888 Allergy status to other drugs, medicaments and biological substances status: Secondary | ICD-10-CM

## 2021-10-21 DIAGNOSIS — H9190 Unspecified hearing loss, unspecified ear: Secondary | ICD-10-CM | POA: Diagnosis present

## 2021-10-21 DIAGNOSIS — F909 Attention-deficit hyperactivity disorder, unspecified type: Secondary | ICD-10-CM | POA: Diagnosis present

## 2021-10-21 DIAGNOSIS — D75839 Thrombocytosis, unspecified: Secondary | ICD-10-CM | POA: Diagnosis not present

## 2021-10-21 DIAGNOSIS — I38 Endocarditis, valve unspecified: Secondary | ICD-10-CM | POA: Diagnosis present

## 2021-10-21 DIAGNOSIS — R6883 Chills (without fever): Secondary | ICD-10-CM | POA: Diagnosis present

## 2021-10-21 DIAGNOSIS — M869 Osteomyelitis, unspecified: Secondary | ICD-10-CM | POA: Diagnosis present

## 2021-10-21 DIAGNOSIS — I129 Hypertensive chronic kidney disease with stage 1 through stage 4 chronic kidney disease, or unspecified chronic kidney disease: Secondary | ICD-10-CM | POA: Diagnosis present

## 2021-10-21 DIAGNOSIS — L0291 Cutaneous abscess, unspecified: Secondary | ICD-10-CM | POA: Diagnosis present

## 2021-10-21 DIAGNOSIS — N179 Acute kidney failure, unspecified: Secondary | ICD-10-CM | POA: Diagnosis not present

## 2021-10-21 DIAGNOSIS — B9561 Methicillin susceptible Staphylococcus aureus infection as the cause of diseases classified elsewhere: Secondary | ICD-10-CM

## 2021-10-21 DIAGNOSIS — E876 Hypokalemia: Secondary | ICD-10-CM | POA: Diagnosis present

## 2021-10-21 DIAGNOSIS — Z6841 Body Mass Index (BMI) 40.0 and over, adult: Secondary | ICD-10-CM | POA: Diagnosis not present

## 2021-10-21 DIAGNOSIS — I330 Acute and subacute infective endocarditis: Secondary | ICD-10-CM | POA: Diagnosis not present

## 2021-10-21 DIAGNOSIS — Z7982 Long term (current) use of aspirin: Secondary | ICD-10-CM

## 2021-10-21 DIAGNOSIS — T8141XA Infection following a procedure, superficial incisional surgical site, initial encounter: Secondary | ICD-10-CM | POA: Diagnosis present

## 2021-10-21 DIAGNOSIS — R439 Unspecified disturbances of smell and taste: Secondary | ICD-10-CM | POA: Diagnosis present

## 2021-10-21 DIAGNOSIS — Z981 Arthrodesis status: Secondary | ICD-10-CM

## 2021-10-21 DIAGNOSIS — I2510 Atherosclerotic heart disease of native coronary artery without angina pectoris: Secondary | ICD-10-CM | POA: Diagnosis present

## 2021-10-21 DIAGNOSIS — T8149XA Infection following a procedure, other surgical site, initial encounter: Secondary | ICD-10-CM | POA: Diagnosis not present

## 2021-10-21 DIAGNOSIS — R0602 Shortness of breath: Secondary | ICD-10-CM | POA: Diagnosis present

## 2021-10-21 DIAGNOSIS — E871 Hypo-osmolality and hyponatremia: Secondary | ICD-10-CM | POA: Diagnosis present

## 2021-10-21 DIAGNOSIS — E785 Hyperlipidemia, unspecified: Secondary | ICD-10-CM | POA: Diagnosis present

## 2021-10-21 DIAGNOSIS — R7881 Bacteremia: Secondary | ICD-10-CM | POA: Diagnosis present

## 2021-10-21 DIAGNOSIS — G4733 Obstructive sleep apnea (adult) (pediatric): Secondary | ICD-10-CM | POA: Diagnosis present

## 2021-10-21 DIAGNOSIS — U071 COVID-19: Secondary | ICD-10-CM | POA: Diagnosis present

## 2021-10-21 DIAGNOSIS — M459 Ankylosing spondylitis of unspecified sites in spine: Secondary | ICD-10-CM | POA: Diagnosis present

## 2021-10-21 DIAGNOSIS — N1831 Chronic kidney disease, stage 3a: Secondary | ICD-10-CM | POA: Diagnosis present

## 2021-10-21 DIAGNOSIS — I252 Old myocardial infarction: Secondary | ICD-10-CM

## 2021-10-21 DIAGNOSIS — Z79899 Other long term (current) drug therapy: Secondary | ICD-10-CM

## 2021-10-21 LAB — AEROBIC/ANAEROBIC CULTURE W GRAM STAIN (SURGICAL/DEEP WOUND): Gram Stain: NONE SEEN

## 2021-10-21 LAB — CBC WITH DIFFERENTIAL/PLATELET
Abs Immature Granulocytes: 0 10*3/uL (ref 0.00–0.07)
Basophils Absolute: 0.4 10*3/uL — ABNORMAL HIGH (ref 0.0–0.1)
Basophils Relative: 2 %
Eosinophils Absolute: 0.4 10*3/uL (ref 0.0–0.5)
Eosinophils Relative: 2 %
HCT: 35.5 % — ABNORMAL LOW (ref 39.0–52.0)
Hemoglobin: 11.4 g/dL — ABNORMAL LOW (ref 13.0–17.0)
Lymphocytes Relative: 14 %
Lymphs Abs: 2.5 10*3/uL (ref 0.7–4.0)
MCH: 30.4 pg (ref 26.0–34.0)
MCHC: 32.1 g/dL (ref 30.0–36.0)
MCV: 94.7 fL (ref 80.0–100.0)
Monocytes Absolute: 1.9 10*3/uL — ABNORMAL HIGH (ref 0.1–1.0)
Monocytes Relative: 11 %
Neutro Abs: 12.6 10*3/uL — ABNORMAL HIGH (ref 1.7–7.7)
Neutrophils Relative %: 71 %
Platelets: 325 10*3/uL (ref 150–400)
RBC: 3.75 MIL/uL — ABNORMAL LOW (ref 4.22–5.81)
RDW: 14.7 % (ref 11.5–15.5)
WBC: 17.7 10*3/uL — ABNORMAL HIGH (ref 4.0–10.5)
nRBC: 0 % (ref 0.0–0.2)
nRBC: 0 /100 WBC

## 2021-10-21 LAB — COMPREHENSIVE METABOLIC PANEL
ALT: 26 U/L (ref 0–44)
AST: 32 U/L (ref 15–41)
Albumin: 2.7 g/dL — ABNORMAL LOW (ref 3.5–5.0)
Alkaline Phosphatase: 102 U/L (ref 38–126)
Anion gap: 13 (ref 5–15)
BUN: 18 mg/dL (ref 8–23)
CO2: 22 mmol/L (ref 22–32)
Calcium: 8.4 mg/dL — ABNORMAL LOW (ref 8.9–10.3)
Chloride: 97 mmol/L — ABNORMAL LOW (ref 98–111)
Creatinine, Ser: 1.48 mg/dL — ABNORMAL HIGH (ref 0.61–1.24)
GFR, Estimated: 53 mL/min — ABNORMAL LOW (ref >60)
Glucose, Bld: 142 mg/dL — ABNORMAL HIGH (ref 70–99)
Potassium: 3 mmol/L — ABNORMAL LOW (ref 3.5–5.1)
Sodium: 132 mmol/L — ABNORMAL LOW (ref 135–145)
Total Bilirubin: 0.7 mg/dL (ref 0.3–1.2)
Total Protein: 7.2 g/dL (ref 6.5–8.1)

## 2021-10-21 LAB — RESP PANEL BY RT-PCR (FLU A&B, COVID) ARPGX2
Influenza A by PCR: NEGATIVE
Influenza B by PCR: NEGATIVE
SARS Coronavirus 2 by RT PCR: POSITIVE — AB

## 2021-10-21 LAB — LACTIC ACID, PLASMA: Lactic Acid, Venous: 1.9 mmol/L (ref 0.5–1.9)

## 2021-10-21 MED ORDER — ASPIRIN EC 81 MG PO TBEC
81.0000 mg | DELAYED_RELEASE_TABLET | Freq: Every day | ORAL | Status: DC
  Administered 2021-10-21 – 2021-10-28 (×8): 81 mg via ORAL
  Filled 2021-10-21 (×8): qty 1

## 2021-10-21 MED ORDER — ETANERCEPT 50 MG/ML ~~LOC~~ SOSY: 50.0000 mg | PREFILLED_SYRINGE | SUBCUTANEOUS | Status: DC

## 2021-10-21 MED ORDER — ENOXAPARIN SODIUM 80 MG/0.8ML IJ SOSY
65.0000 mg | PREFILLED_SYRINGE | INTRAMUSCULAR | Status: DC
  Filled 2021-10-21 (×3): qty 0.65

## 2021-10-21 MED ORDER — LISINOPRIL-HYDROCHLOROTHIAZIDE 20-12.5 MG PO TABS: 1.0000 | ORAL_TABLET | Freq: Every day | ORAL | Status: DC

## 2021-10-21 MED ORDER — MONTELUKAST SODIUM 10 MG PO TABS
10.0000 mg | ORAL_TABLET | Freq: Every day | ORAL | Status: DC
  Administered 2021-10-21 – 2021-10-27 (×7): 10 mg via ORAL
  Filled 2021-10-21 (×8): qty 1

## 2021-10-21 MED ORDER — HYDROCHLOROTHIAZIDE 12.5 MG PO TABS
12.5000 mg | ORAL_TABLET | Freq: Every day | ORAL | Status: DC
  Administered 2021-10-21 – 2021-10-28 (×8): 12.5 mg via ORAL
  Filled 2021-10-21 (×8): qty 1

## 2021-10-21 MED ORDER — RIFAMPIN 300 MG PO CAPS
300.0000 mg | ORAL_CAPSULE | Freq: Two times a day (BID) | ORAL | Status: DC
  Administered 2021-10-21 – 2021-10-28 (×14): 300 mg via ORAL
  Filled 2021-10-21 (×17): qty 1

## 2021-10-21 MED ORDER — CEFAZOLIN SODIUM-DEXTROSE 2-4 GM/100ML-% IV SOLN
2.0000 g | Freq: Three times a day (TID) | INTRAVENOUS | Status: DC
  Administered 2021-10-21 – 2021-10-28 (×22): 2 g via INTRAVENOUS
  Filled 2021-10-21 (×22): qty 100

## 2021-10-21 MED ORDER — LISINOPRIL 20 MG PO TABS
20.0000 mg | ORAL_TABLET | Freq: Every day | ORAL | Status: DC
  Administered 2021-10-21 – 2021-10-26 (×6): 20 mg via ORAL
  Filled 2021-10-21 (×6): qty 1

## 2021-10-21 MED ORDER — LORATADINE 10 MG PO TABS
10.0000 mg | ORAL_TABLET | Freq: Every day | ORAL | Status: DC
  Administered 2021-10-21 – 2021-10-28 (×5): 10 mg via ORAL
  Filled 2021-10-21 (×8): qty 1

## 2021-10-21 MED ORDER — CEFAZOLIN SODIUM-DEXTROSE 1-4 GM/50ML-% IV SOLN
1.0000 g | Freq: Three times a day (TID) | INTRAVENOUS | Status: DC
  Administered 2021-10-21: 1 g via INTRAVENOUS
  Filled 2021-10-21: qty 50

## 2021-10-21 MED ORDER — POTASSIUM CHLORIDE CRYS ER 20 MEQ PO TBCR
40.0000 meq | EXTENDED_RELEASE_TABLET | Freq: Two times a day (BID) | ORAL | Status: AC
  Administered 2021-10-21 (×2): 40 meq via ORAL
  Filled 2021-10-21 (×2): qty 2

## 2021-10-21 MED ORDER — COLCHICINE 0.6 MG PO TABS
0.6000 mg | ORAL_TABLET | Freq: Every day | ORAL | Status: DC | PRN
  Filled 2021-10-21: qty 1

## 2021-10-21 MED ORDER — ATORVASTATIN CALCIUM 10 MG PO TABS
20.0000 mg | ORAL_TABLET | Freq: Every day | ORAL | Status: DC
  Administered 2021-10-21 – 2021-10-27 (×7): 20 mg via ORAL
  Filled 2021-10-21 (×7): qty 2

## 2021-10-21 MED ORDER — FOLIC ACID 1 MG PO TABS
1.0000 mg | ORAL_TABLET | Freq: Every evening | ORAL | Status: DC
  Administered 2021-10-21 – 2021-10-27 (×6): 1 mg via ORAL
  Filled 2021-10-21 (×6): qty 1

## 2021-10-21 MED ORDER — ENOXAPARIN SODIUM 40 MG/0.4ML IJ SOSY
40.0000 mg | PREFILLED_SYRINGE | INTRAMUSCULAR | Status: DC
  Filled 2021-10-21: qty 0.4

## 2021-10-21 MED ORDER — ALLOPURINOL 300 MG PO TABS
300.0000 mg | ORAL_TABLET | Freq: Every day | ORAL | Status: DC
  Administered 2021-10-21 – 2021-10-28 (×8): 300 mg via ORAL
  Filled 2021-10-21 (×5): qty 1
  Filled 2021-10-21: qty 3
  Filled 2021-10-21 (×2): qty 1

## 2021-10-21 MED ORDER — VITAMIN D 25 MCG (1000 UNIT) PO TABS
2000.0000 [IU] | ORAL_TABLET | Freq: Every evening | ORAL | Status: DC
  Administered 2021-10-21 – 2021-10-27 (×6): 2000 [IU] via ORAL
  Filled 2021-10-21 (×6): qty 2

## 2021-10-21 MED ORDER — METOPROLOL TARTRATE 50 MG PO TABS
50.0000 mg | ORAL_TABLET | Freq: Every day | ORAL | Status: DC
  Administered 2021-10-21 – 2021-10-28 (×8): 50 mg via ORAL
  Filled 2021-10-21 (×6): qty 1
  Filled 2021-10-21: qty 2
  Filled 2021-10-21: qty 1

## 2021-10-21 NOTE — Progress Notes (Signed)
PHARMACY CONSULT NOTE FOR:  OUTPATIENT  PARENTERAL ANTIBIOTIC THERAPY (OPAT)  Indication: MSSA cervical spine surgical osteomyelitis  Regimen: Cefazolin 2g IV q8h End date: 12/11/21  IV antibiotic discharge orders are pended. To discharging provider:  please sign these orders via discharge navigator,  Select New Orders & click on the button choice - Manage This Unsigned Work.     Thank you for allowing pharmacy to be a part of this patient's care.  Sharin Mons, PharmD, BCPS, BCIDP Infectious Diseases Clinical Pharmacist Phone: [phone] 10/21/2021 2:29 PM

## 2021-10-21 NOTE — ED Notes (Signed)
Wound Vac was delivered to the pt's room.

## 2021-10-21 NOTE — Consult Note (Signed)
WOC Nurse Consult Note: Patient receiving care in [facility] [id number] Discharged yesterday from [facility] with plan to come back today through the ED to be re-admitted for further medical management and TEE. Did not receive authorization from the [place] for home vac before leaving, therefor W/D dressing was placed for overnight intervention. Reason for Consult: Re-apply vac dressing.  Wound type: Surgical cervical incision Pressure Injury POA: NA Measurement: 9.5 cm x 4 cm x 5 cm x undermining of 4.5 on left and right side of the wound.  Wound bed: pink granulation tissue on left and right side of the wound. Depth is down to the bone is yellow Drainage (amount, consistency, odor) Sanguinous on dressing that was removed.  Periwound: intact Dressing procedure/placement/frequency: Removed W/D dressing. Cleaned surrounding skin with no sting barrier film. Placed 2 pieces of black foam layered in the wound, drape applied and immediate suction obtained at 125 mmHg.  WOC will follow for vac dressing changes M/W/F. Will order supplies needed when patient is transferred to a room.  [name], MSN, RN, CMSRN, [name], [facility] Wound Treatment Associate Pager [phone]

## 2021-10-21 NOTE — ED Provider Notes (Signed)
[facility] EMERGENCY DEPARTMENT Provider Note  CSN: [id number] Arrival date & time: 10/21/21 6384  Chief Complaint(s) blood infection and open wound  HPI Bill [name] is a 66 y.o. 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  Patient left the hospital AMA yesterday as it was an unplanned hospitalization and he "needed to take care of some things at home".  His discharge plan yesterday was for immediate readmission to the hospital today.  he arrives with no complaints of fever, worsening pain at the incisional site, chest pain, abdominal pain, nausea, vomiting, chills or other systemic symptoms.  He does endorse persistent shortness of breath with exertion.   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 Authorizing Provider  allopurinol (ZYLOPRIM) 300 MG tablet Take 300 mg by mouth daily.    [provider]  aspirin EC 81 MG tablet Take 1 tablet (81 mg total) by mouth daily. Swallow whole. Restart on 12/14/20 12/09/20   Jadene Pierini, MD  atorvastatin (LIPITOR) 20 MG tablet Take 20 mg by mouth at bedtime.    [provider]  cetirizine (ZYRTEC) 10 MG tablet Take 10 mg by mouth at bedtime.    [provider]  Cholecalciferol (VITAMIN D) 50 MCG (2000 UT) tablet Take 2,000 Units by mouth every evening.    [provider]  colchicine 0.6 MG tablet Take 0.6 mg by mouth daily as needed (Gout). 12/08/19   [provider]  etanercept (ENBREL) 50 MG/ML injection Inject 50 mg into the skin every Saturday.    [provider]  folic acid (FOLVITE) 1 MG tablet Take 1 mg by mouth every evening.    [provider]  lisinopril-hydrochlorothiazide (PRINZIDE,ZESTORETIC) 20-12.5 MG per tablet Take 1 tablet by mouth daily.    [provider]  methotrexate (RHEUMATREX) 2.5 MG tablet Take 15 mg by mouth every Wednesday. Caution:Chemotherapy. Protect from light.    [provider]  metoprolol tartrate (LOPRESSOR) 50 MG tablet Take 50 mg by mouth daily. 01/21/20   [provider]  montelukast (SINGULAIR) 10 MG tablet Take 10 mg by mouth at bedtime.    [provider]  oxyCODONE (OXY IR/ROXICODONE) 5 MG immediate release tablet Take  1 tablet (5 mg total) by mouth every 4 (four) hours as needed for moderate pain ((score 4 to 6)). 09/21/21   Jadene Pierini, MD  rifampin (RIFADIN) 300 MG capsule Take 1 capsule (300 mg total) by mouth every 12 (twelve) hours. 10/20/21   Champ Mungo, DO                                                                                                                                    Past Surgical  History Past Surgical History:  Procedure Laterality Date   ANTERIOR CERVICAL DECOMP/DISCECTOMY FUSION N/A 12/09/2020   Procedure: Cervical Six-Seven Anterior Cervical Decompression/Discectomy Fusio;  Surgeon: Jadene Pierini, MD;  Location: [facility] OR;  Service: Neurosurgery;  Laterality: N/A;  3C   ANTERIOR FUSION CERVICAL SPINE  ~ 2010   "carbon cage C3-C7"   BACK SURGERY     HERNIA REPAIR  03/11/2015   INCISIONAL HERNIA REPAIR  03/11/2015   w/mesh   INCISIONAL HERNIA REPAIR N/A 03/11/2015   Procedure: OPEN INCISIONAL HERNIA REPAIR WITH MESH ;  Surgeon: Abigail Miyamoto, MD;  Location: [facility] OR;  Service: General;  Laterality: N/A;   INGUINAL HERNIA REPAIR Bilateral early 2000's; 03/11/2015   w/mesh; w/mesh   INGUINAL HERNIA REPAIR Bilateral 03/11/2015   Procedure: LAPAROSCOPIC CONVERTED TO BILATERAL INGUINAL HERNIA REPAIR WITH MESH;  Surgeon: Abigail Miyamoto, MD;  Location: [facility] OR;  Service: General;  Laterality: Bilateral;   KNEE ARTHROSCOPY Left ~ 2009   LAPAROSCOPIC CHOLECYSTECTOMY  ~ 2012   NASAL SINUS SURGERY  2005   POSTERIOR CERVICAL FUSION/FORAMINOTOMY N/A 09/20/2021   Procedure: Cervical five to Cervical seven Posterior instrumented fusion;  Surgeon: Jadene Pierini, MD;  Location: [facility] OR;  Service: Neurosurgery;  Laterality: N/A;   POSTERIOR CERVICAL FUSION/FORAMINOTOMY N/A 10/15/2021   Procedure: IRRIGATION AND DEBRIDEMENT OF POSTERIOR CERVICAL WOUND WITH PLACEMENT OF WOUND VAC;  Surgeon: Bedelia Person, MD;  Location: [facility] OR;  Service: Neurosurgery;  Laterality: N/A;   POSTERIOR LUMBAR FUSION     L4-L5 screws and rods   TONSILLECTOMY     Family History No family history on file.  Social History Social History   Tobacco Use   Smoking status: Never   Smokeless tobacco: Never  Vaping Use   Vaping Use: Never used  Substance Use Topics   Alcohol use: Not Currently    Comment:  03/11/2015 "I might have a drink once/wk"   Drug use: No   Allergies Amlodipine and  Duloxetine  Review of Systems Review of Systems  Respiratory:  Positive for shortness of breath.   Skin:  Positive for wound.   Physical Exam Vital Signs  I have reviewed the triage vital signs BP (!) 156/73 (BP Location: Right Arm)    Pulse (!) 112    Temp 98.7 F (37.1 C) (Oral)    Resp 18    SpO2 97%   Physical Exam Vitals and nursing  note reviewed.  Constitutional:      General: He is not in acute distress.    Appearance: He is well-developed.  HENT:     Head: Normocephalic and atraumatic.  Eyes:     Conjunctiva/sclera: Conjunctivae normal.  Cardiovascular:     Rate and Rhythm: Normal rate and regular rhythm.     Heart sounds: Murmur heard.  Pulmonary:     Effort: Pulmonary effort is normal. No respiratory distress.     Breath sounds: Normal breath sounds.  Abdominal:     Palpations: Abdomen is soft.     Tenderness: There is no abdominal tenderness.  Musculoskeletal:        General: No swelling.     Cervical back: Neck supple.  Skin:    General: Skin is warm and dry.     Capillary Refill: Capillary refill takes less than 2 seconds.     Findings: Lesion (Large wound on the distal cervical spine and proximal thoracic spine, with no active drainage, no erythema) present.  Neurological:     Mental Status: He is alert.  Psychiatric:        Mood and Affect: Mood normal.    ED Results and Treatments Labs (all labs ordered are listed, but only abnormal results are displayed) Labs Reviewed  CBC WITH DIFFERENTIAL/PLATELET  COMPREHENSIVE METABOLIC PANEL  LACTIC ACID, PLASMA  LACTIC ACID, PLASMA                                                                                                                          Radiology MR THORACIC SPINE W WO CONTRAST  Result Date: 10/20/2021 CLINICAL DATA:  History of ankylosing spondylitis, recent posterior cervical fusion, presents with fevers and drainage from surgical site EXAM: MRI THORACIC AND LUMBAR SPINE WITHOUT AND WITH  CONTRAST TECHNIQUE: Multiplanar and multiecho pulse sequences of the thoracic and lumbar spine were obtained without and with intravenous contrast. CONTRAST:  10mL GADAVIST GADOBUTROL 1 MMOL/ML IV SOLN COMPARISON:  Cervical spine MRI 10/15/2021 FINDINGS: MRI THORACIC SPINE FINDINGS Alignment:  Normal. Vertebrae: Thoracic vertebral body heights are preserved. A focus of T1 and T2 hyperintensity in the T6 vertebral body likely reflects an intraosseous hemangioma. Otherwise, marrow signal is normal. There is no suspicious marrow signal abnormality. There is no marrow edema. There is no evidence of discitis/osteomyelitis. Cord/canal: The thoracic cord is normal in signal and morphology. There is thin epidural enhancing tissue along the dorsal aspect of the canal extending from the cervical spine beyond the field-of-view to the T2 level suspicious for epidural phlegmon. This is thickest at the C6-C7 level where it measures up to approximately 3 mm along the dorsal lateral aspects of the canal (17-8, 18-5). There is mild spinal canal narrowing without significant mass effect on the cord or cord compression. There is no convincing central non enhancement to suggest definite abscess formation. Paraspinal and other soft tissues: The large collection posterior to the operative levels in the cervical spine is partially imaged  on the current study. This is better evaluated on the recent dedicated cervical spine MRI. The paraspinal soft tissues in the thoracic spine are unremarkable. Disc levels: The disc heights in the thoracic spine are overall preserved. There are small protrusions at T3-T4, T7-T8, T8-T9, T9-T10, T10-T11, and T11-T12. There is no significant spinal canal or neural foraminal stenosis. MRI LUMBAR SPINE FINDINGS Segmentation:  Standard. Alignment: There is straightening of the normal lumbar lordosis. There is no antero or retrolisthesis. Vertebrae: Postsurgical changes reflecting posterior instrumented fusion at  L4-L5 are noted. A T1 and T2 hyperintense lesion in the L3 vertebral body likely reflects an intraosseous hemangioma. There is mild degenerative endplate marrow signal abnormality at L3-L4 and L5-S1. An intrinsically T1 hyperintense lesion in the left iliac wing may reflect an additional hemangioma. Marrow signal is otherwise somewhat heterogeneous throughout without other focal or suspicious marrow signal abnormality. Conus medullaris: Extends to the L1-L2 level and appears normal. There is no abnormal enhancement of the conus or cauda equina nerve roots. Paraspinal and other soft tissues: Unremarkable. Disc levels: There is multilevel disc desiccation and narrowing at the nonsurgical levels, most advanced at L5-S1. T12-L1: No significant spinal canal or neural foraminal stenosis L1-L2: There is a mild diffuse disc bulge with a small inferiorly migrated central extrusion without significant spinal canal or neural foraminal stenosis L2-L3: There is a mild diffuse disc bulge and mild bilateral facet arthropathy with a small left effusion resulting in mild spinal canal narrowing without significant neural foraminal stenosis or evidence of nerve root impingement. L2-L3: There is a diffuse disc bulge, ligamentum flavum thickening, and bilateral facet arthropathy with a medially projecting right facet joint synovial cyst measuring 6 mm resulting in moderate to severe spinal canal stenosis with crowding of the subarticular zones and possible impingement of either traversing L4 nerve root. Additionally, the synovial cyst displaces the right-sided cauda equina nerve roots (13-17). L4-L5: Status post posterior instrumented fusion without significant spinal canal or neural foraminal stenosis. L5-S1: There is a diffuse disc bulge with a superimposed central protrusion, degenerative endplate change, and bilateral facet arthropathy resulting in narrowing of the bilateral subarticular zones with possible irritation of either  traversing S1 nerve root and mild bilateral neural foraminal stenosis. IMPRESSION: Thoracic spine: 1. Epidural enhancing tissue along the dorsal aspect of the canal beginning in the cervical spine out of the field of view extending to the T2 level suspicious for epidural phlegmon. There is no definite central nonenhancing component to suggest abscess formation. The phlegmon result in mild spinal canal stenosis without significant mass effect on the cord or cord compression. Consider postcontrast imaging of the cervical spine to assess for the superior extent of this phlegmon. 2. Incompletely imaged fluid collection posterior to the operative levels in the cervical spine, better evaluated on recent dedicated cervical spine MRI. 3. No evidence of discitis/osteomyelitis in the thoracic spine. Lumbar spine: 1. Status post posterior instrumented fusion at L4-L5 without significant spinal canal or neural foraminal stenosis at this level. 2. Adjacent segment disease at L3-L4 resulting in moderate to severe spinal canal stenosis with crowding of the subarticular zones and possible impingement of either traversing L4 nerve root. Additionally, there is a 6 mm medially projecting right facet joint synovial cyst which displaces the right-sided cauda equina nerve roots. 3. Diffuse disc bulge at L5-S1 resulting in narrowing of the subarticular zones with possible irritation of either traversing S1 nerve root. 4. No evidence of discitis/osteomyelitis in the lumbar spine. Electronically Signed   By: Theron Arista  Noone M.D.   On: 10/20/2021 14:29   MR Lumbar Spine W Wo Contrast  Result Date: 10/20/2021 CLINICAL DATA:  History of ankylosing spondylitis, recent posterior cervical fusion, presents with fevers and drainage from surgical site EXAM: MRI THORACIC AND LUMBAR SPINE WITHOUT AND WITH CONTRAST TECHNIQUE: Multiplanar and multiecho pulse sequences of the thoracic and lumbar spine were obtained without and with intravenous contrast.  CONTRAST:  10mL GADAVIST GADOBUTROL 1 MMOL/ML IV SOLN COMPARISON:  Cervical spine MRI 10/15/2021 FINDINGS: MRI THORACIC SPINE FINDINGS Alignment:  Normal. Vertebrae: Thoracic vertebral body heights are preserved. A focus of T1 and T2 hyperintensity in the T6 vertebral body likely reflects an intraosseous hemangioma. Otherwise, marrow signal is normal. There is no suspicious marrow signal abnormality. There is no marrow edema. There is no evidence of discitis/osteomyelitis. Cord/canal: The thoracic cord is normal in signal and morphology. There is thin epidural enhancing tissue along the dorsal aspect of the canal extending from the cervical spine beyond the field-of-view to the T2 level suspicious for epidural phlegmon. This is thickest at the C6-C7 level where it measures up to approximately 3 mm along the dorsal lateral aspects of the canal (17-8, 18-5). There is mild spinal canal narrowing without significant mass effect on the cord or cord compression. There is no convincing central non enhancement to suggest definite abscess formation. Paraspinal and other soft tissues: The large collection posterior to the operative levels in the cervical spine is partially imaged on the current study. This is better evaluated on the recent dedicated cervical spine MRI. The paraspinal soft tissues in the thoracic spine are unremarkable. Disc levels: The disc heights in the thoracic spine are overall preserved. There are small protrusions at T3-T4, T7-T8, T8-T9, T9-T10, T10-T11, and T11-T12. There is no significant spinal canal or neural foraminal stenosis. MRI LUMBAR SPINE FINDINGS Segmentation:  Standard. Alignment: There is straightening of the normal lumbar lordosis. There is no antero or retrolisthesis. Vertebrae: Postsurgical changes reflecting posterior instrumented fusion at L4-L5 are noted. A T1 and T2 hyperintense lesion in the L3 vertebral body likely reflects an intraosseous hemangioma. There is mild degenerative  endplate marrow signal abnormality at L3-L4 and L5-S1. An intrinsically T1 hyperintense lesion in the left iliac wing may reflect an additional hemangioma. Marrow signal is otherwise somewhat heterogeneous throughout without other focal or suspicious marrow signal abnormality. Conus medullaris: Extends to the L1-L2 level and appears normal. There is no abnormal enhancement of the conus or cauda equina nerve roots. Paraspinal and other soft tissues: Unremarkable. Disc levels: There is multilevel disc desiccation and narrowing at the nonsurgical levels, most advanced at L5-S1. T12-L1: No significant spinal canal or neural foraminal stenosis L1-L2: There is a mild diffuse disc bulge with a small inferiorly migrated central extrusion without significant spinal canal or neural foraminal stenosis L2-L3: There is a mild diffuse disc bulge and mild bilateral facet arthropathy with a small left effusion resulting in mild spinal canal narrowing without significant neural foraminal stenosis or evidence of nerve root impingement. L2-L3: There is a diffuse disc bulge, ligamentum flavum thickening, and bilateral facet arthropathy with a medially projecting right facet joint synovial cyst measuring 6 mm resulting in moderate to severe spinal canal stenosis with crowding of the subarticular zones and possible impingement of either traversing L4 nerve root. Additionally, the synovial cyst displaces the right-sided cauda equina nerve roots (13-17). L4-L5: Status post posterior instrumented fusion without significant spinal canal or neural foraminal stenosis. L5-S1: There is a diffuse disc bulge with a superimposed central  protrusion, degenerative endplate change, and bilateral facet arthropathy resulting in narrowing of the bilateral subarticular zones with possible irritation of either traversing S1 nerve root and mild bilateral neural foraminal stenosis. IMPRESSION: Thoracic spine: 1. Epidural enhancing tissue along the dorsal  aspect of the canal beginning in the cervical spine out of the field of view extending to the T2 level suspicious for epidural phlegmon. There is no definite central nonenhancing component to suggest abscess formation. The phlegmon result in mild spinal canal stenosis without significant mass effect on the cord or cord compression. Consider postcontrast imaging of the cervical spine to assess for the superior extent of this phlegmon. 2. Incompletely imaged fluid collection posterior to the operative levels in the cervical spine, better evaluated on recent dedicated cervical spine MRI. 3. No evidence of discitis/osteomyelitis in the thoracic spine. Lumbar spine: 1. Status post posterior instrumented fusion at L4-L5 without significant spinal canal or neural foraminal stenosis at this level. 2. Adjacent segment disease at L3-L4 resulting in moderate to severe spinal canal stenosis with crowding of the subarticular zones and possible impingement of either traversing L4 nerve root. Additionally, there is a 6 mm medially projecting right facet joint synovial cyst which displaces the right-sided cauda equina nerve roots. 3. Diffuse disc bulge at L5-S1 resulting in narrowing of the subarticular zones with possible irritation of either traversing S1 nerve root. 4. No evidence of discitis/osteomyelitis in the lumbar spine. Electronically Signed   By: Lesia HausenPeter  Noone M.D.   On: 10/20/2021 14:29    Pertinent labs & imaging results that were available during my care of the patient were reviewed by me and considered in my medical decision making (see MDM for details).  Medications Ordered in ED Medications  ceFAZolin (ANCEF) IVPB 1 g/50 mL premix (has no administration in time range)                                                                                                                                     Procedures Procedures  (including critical care time)  Medical Decision Making / ED Course   This  patient presents to the ED for concern of wound infection, this involves an extensive number of treatment options, and is a complaint that carries with it a high risk of complications and morbidity.  The differential diagnosis includes wound infection, osteomyelitis, endocarditis, sepsis, bacteremia  MDM: Patient seen emergency department for evaluation of a wound infection and known endocarditis.  Physical exam reveals an appropriately healing surgical wound status post debridement in the cervical and thoracic spine, cardiac murmur.  Patient's plan on discharge was for immediate readmission to the internal medicine service and immediate consultation of infectious disease as well as restarting cefazolin for his underlying MSSA bacteremia.  Infectious disease was consulted who came to see the patient at bedside.  The recommendations are pending.  Per internal medicine plan as of yesterday, patient was immediately readmitted prior  to return of labs.  Patient accepted to internal medicine residency service.   Additional history obtained:  -External records from outside source obtained and reviewed including: Chart review including previous notes, labs, imaging, consultation notes   Lab Tests: -I ordered, reviewed, and interpreted labs.   The pertinent results include:   Labs Reviewed  CBC WITH DIFFERENTIAL/PLATELET  COMPREHENSIVE METABOLIC PANEL  LACTIC ACID, PLASMA  LACTIC ACID, PLASMA   Medicines ordered and prescription drug management: Meds ordered this encounter  Medications   ceFAZolin (ANCEF) IVPB 1 g/50 mL premix    Order Specific Question:   Antibiotic Indication:    Answer:   Wound Infection    -I have reviewed the patients home medicines and have made adjustments as needed  Critical interventions none  Consultations Obtained: I requested consultation with the infectious disease,  and discussed lab and imaging findings as well as pertinent plan - they recommend:  Readmission   Cardiac Monitoring: The patient was maintained on a cardiac monitor.  I personally viewed and interpreted the cardiac monitored which showed an underlying rhythm of: Sinus tachycardia  Social Determinants of Health:  Factors impacting patients care include: None   Reevaluation: After the interventions noted above, I reevaluated the patient and found that they have :stayed the same  Co morbidities that complicate the patient evaluation  Past Medical History:  Diagnosis Date   ADHD (attention deficit hyperactivity disorder)    Anemia    Ankylosing spondylitis (HCC)    Chronic kidney disease    Coronary artery disease    Depression    Gout    Hearing loss    Hyperlipidemia    Hypertension    Morbid obesity with BMI of 40.0-44.9, adult (HCC)    Myocardial infarction (HCC)    OSA treated with BiPAP    Restrictive lung disease    Spondylarthritis       Dispostion: I considered admission for this patient, and due to known bacteremia and endocarditis patient was readmitted.     Final Clinical Impression(s) / ED Diagnoses Final diagnoses:  None     @PCDICTATION @    Glendora Score, MD 10/21/21 [phone]

## 2021-10-21 NOTE — H&P (Signed)
NAME:  Bill [name], MRN:  [id number], DOB:  1955-10-31, LOS: 0 ADMISSION DATE:  10/21/2021, Primary: [facility], [facility]  CHIEF COMPLAINT:  post op wound infection   Medical Service: Internal Medicine Teaching Service         Attending Physician: Dr. Lottie Mussel, MD    First Contact: Dr. Marlou Sa Pager: [phone]  Second Contact: Dr. Johnney Ou Pager: [phone]       After Hours (After 5p/  First Contact Pager: [phone]  weekends / holidays): Second Contact Pager: [phone]    History of present illness   66 year old male who underwent posterior cervical fusion on 1/3 complicated by postoperative wound abscess.  He was admitted for this 1/28 - 10/20/2021 during which time he underwent I&D by neurosurgery.  Wound VAC was placed.  Wound cultures grew gram-positive cocci.   PICC line was placed for outpatient antibiotic therapy.1 out of 4 blood cultures drawn on 1/31 were positive for staph aureus.  Patient had a gradually increasing leukocytosis.  TTE was obtained and revealed highly mobile echodense mass in the posterior leaflet of the mitral valve concerning for calcification versus vegetation.  Patient was agreeable to meeting with TEE however requested to be discharged home on the evening of 2/2 to take care of some household chores.  PICC line and wound VAC were removed before discharge.    Patient returned to the ED this morning for readmission with plans on pursuing TEE on Monday.  Denies any changes since he left the hospital last evening.  He denies, chills, or pain.  Past Medical History  He,  has a past medical history of ADHD (attention deficit hyperactivity disorder), Anemia, Ankylosing spondylitis ([place]), Chronic kidney disease, Coronary artery disease, Depression, Gout, Hearing loss, Hyperlipidemia, Hypertension, Morbid obesity with BMI of 40.0-44.9, adult ([place]), Myocardial infarction ([place]), OSA treated with BiPAP, Restrictive lung disease, and Spondylarthritis.   Home  Medications     Prior to Admission medications   Medication Sig Start Date End Date Taking? Authorizing Provider  allopurinol (ZYLOPRIM) 300 MG tablet Take 300 mg by mouth daily.    [provider]  aspirin EC 81 MG tablet Take 1 tablet (81 mg total) by mouth daily. Swallow whole. Restart on 12/14/20 12/09/20   Judith Part, MD  atorvastatin (LIPITOR) 20 MG tablet Take 20 mg by mouth at bedtime.    [provider]  cetirizine (ZYRTEC) 10 MG tablet Take 10 mg by mouth at bedtime.    [provider]  Cholecalciferol (VITAMIN D) 50 MCG (2000 UT) tablet Take 2,000 Units by mouth every evening.    [provider]  colchicine 0.6 MG tablet Take 0.6 mg by mouth daily as needed (Gout). 12/08/19   [provider]  etanercept (ENBREL) 50 MG/ML injection Inject 50 mg into the skin every Saturday.    [provider]  folic acid (FOLVITE) 1 MG tablet Take 1 mg by mouth every evening.    [provider]  lisinopril-hydrochlorothiazide (PRINZIDE,ZESTORETIC) 20-12.5 MG per tablet Take 1 tablet by mouth daily.    [provider]  methotrexate (RHEUMATREX) 2.5 MG tablet Take 15 mg by mouth every Wednesday. Caution:Chemotherapy. Protect from light.    [provider]  metoprolol tartrate (LOPRESSOR) 50 MG tablet Take 50 mg by mouth daily. 01/21/20   [provider]  montelukast (SINGULAIR) 10 MG tablet Take 10 mg by mouth at bedtime.    [provider]  oxyCODONE (OXY IR/ROXICODONE) 5 MG immediate release tablet Take  1 tablet (5 mg total) by mouth every 4 (four) hours as needed for moderate pain ((score 4 to 6)). 09/21/21   Judith Part, MD  rifampin (RIFADIN) 300 MG capsule Take 1 capsule (300 mg total) by mouth every 12 (twelve) hours. 10/20/21   Farrel Gordon, DO    Allergies    Allergies as of 10/21/2021 - Review Complete 10/21/2021  Allergen Reaction Noted   Amlodipine Swelling 08/09/2018   Duloxetine   09/20/2020    Social History   reports that he has never smoked. He has never used smokeless tobacco. He reports that he does not currently use alcohol. He reports that he does not use drugs.   Family History   Family history reviewed. No pertinent family history.   ROS  10 point review of systems negative unless stated in the HPI.  Objective   Blood pressure (!) 123/57, pulse (!) 106, temperature 98.7 F (37.1 C), temperature source Oral, resp. rate 20, SpO2 95 %.    General: Well-appearing male in no acute distress Eyes: No scleral icterus or conjunctival injection HEENT: Moist mucous membranes, head atraumatic Cardiac: Heart regular rate and rhythm, no lower extremity edema Pulm: Lung sounds clear throughout GI: Obese, soft, nontender Skin: Surgical incision to the back with clean and intact dressing MSK: Muscle bulk and tone Neuro: Alert and oriented x4 Significant Diagnostic Tests:  N/a  Labs    CBC Latest Ref Rng & Units 10/20/2021 10/19/2021 10/18/2021  WBC 4.0 - 10.5 K/uL 19.2(H) 16.1(H) 14.1(H)  Hemoglobin 13.0 - 17.0 g/dL 10.8(L) 10.7(L) 10.2(L)  Hematocrit 39.0 - 52.0 % 33.4(L) 32.9(L) 31.0(L)  Platelets 150 - 400 K/uL 267 221 218   BMP Latest Ref Rng & Units 10/20/2021 10/19/2021 10/18/2021  Glucose 70 - 99 mg/dL 101(H) 110(H) 94  BUN 8 - 23 mg/dL 12 21 26(H)  Creatinine 0.61 - 1.24 mg/dL 1.04 1.36(H) 1.32(H)  Sodium 135 - 145 mmol/L 134(L) 136 137  Potassium 3.5 - 5.1 mmol/L 3.2(L) 3.5 3.5  Chloride 98 - 111 mmol/L 97(L) 100 102  CO2 22 - 32 mmol/L 27 27 25   Calcium 8.9 - 10.3 mg/dL 8.6(L) 8.6(L) 8.5(L)    Summary  66 year old male being readmitted for a postoperative wound infection and bacteremia who was found to have a new mass on the mitral valve concerning for vegetation.  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  Continue to monitor Hypokalemia--repleted with 80 mEq of potassium Mild hyponatremia--continue to monitor  Hypertension: Chronic and stable.  Continue hydrochlorothiazide and lisinopril Hyperlipidemia: Continue statin  Best practice:  CODE STATUS: Full DVT for prophylaxis: Lovenox Dispo: Admit patient to Inpatient with expected length of stay greater than 2 midnights.   Mitzi Hansen, MD Internal Medicine Resident PGY-3 [facility] Pager: [phone] 10/21/2021 9:38 AM

## 2021-10-21 NOTE — Plan of Care (Signed)
  Problem: Education: Goal: Knowledge of General Education information will improve Description: Including pain rating scale, medication(s)/side effects and non-pharmacologic comfort measures Outcome: Progressing   Problem: Clinical Measurements: Goal: Will remain free from infection Outcome: Progressing Goal: Diagnostic test results will improve Outcome: Progressing   

## 2021-10-21 NOTE — ED Notes (Signed)
Provided pt with peanut butter and crackers per provider approval

## 2021-10-21 NOTE — Progress Notes (Addendum)
ID PROGRESS NOTE   Mr Kinner is a 53yoM with cervical fusion in early January, readmitted for purulent drainage from incision site c/w MSSA SSI s/p debridement now with wound vac. Also had concurrent MSSA bacteremia with + blood cx on 1/27 and 1/28. TTE concern for MV endocarditis. Still having leukocytosis despite on appropriate abtx.   Repeat blood cx . 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 Wound vac per protocol  Will see back on Monday. Dr Thursday available for questions over the weekend.  Luciana Axe Duke Salvia MD MPH [facility] [phone]

## 2021-10-21 NOTE — ED Triage Notes (Signed)
Pt came in POV d/t continued "blood infection" that he was being treated for with IV ABT's on his last admission. Pt continues to have drainage from the old old surgical site at C5-C7 & pain rated 1/10. Has returned d/t his blood work showing that the WBC continued to rise while he was here but left to "take care of things" & has returned to continue his treatment.

## 2021-10-22 DIAGNOSIS — E876 Hypokalemia: Secondary | ICD-10-CM

## 2021-10-22 DIAGNOSIS — I10 Essential (primary) hypertension: Secondary | ICD-10-CM

## 2021-10-22 DIAGNOSIS — M459 Ankylosing spondylitis of unspecified sites in spine: Secondary | ICD-10-CM

## 2021-10-22 LAB — CBC WITH DIFFERENTIAL/PLATELET
Abs Immature Granulocytes: 0.78 10*3/uL — ABNORMAL HIGH (ref 0.00–0.07)
Basophils Absolute: 0.1 10*3/uL (ref 0.0–0.1)
Basophils Relative: 1 %
Eosinophils Absolute: 0.4 10*3/uL (ref 0.0–0.5)
Eosinophils Relative: 2 %
HCT: 33.4 % — ABNORMAL LOW (ref 39.0–52.0)
Hemoglobin: 11 g/dL — ABNORMAL LOW (ref 13.0–17.0)
Immature Granulocytes: 4 %
Lymphocytes Relative: 20 %
Lymphs Abs: 3.7 10*3/uL (ref 0.7–4.0)
MCH: 31.1 pg (ref 26.0–34.0)
MCHC: 32.9 g/dL (ref 30.0–36.0)
MCV: 94.4 fL (ref 80.0–100.0)
Monocytes Absolute: 2.6 10*3/uL — ABNORMAL HIGH (ref 0.1–1.0)
Monocytes Relative: 14 %
Neutro Abs: 11.3 10*3/uL — ABNORMAL HIGH (ref 1.7–7.7)
Neutrophils Relative %: 59 %
Platelets: 319 10*3/uL (ref 150–400)
RBC: 3.54 MIL/uL — ABNORMAL LOW (ref 4.22–5.81)
RDW: 14.6 % (ref 11.5–15.5)
WBC: 18.8 10*3/uL — ABNORMAL HIGH (ref 4.0–10.5)
nRBC: 0 % (ref 0.0–0.2)

## 2021-10-22 LAB — BASIC METABOLIC PANEL
Anion gap: 12 (ref 5–15)
BUN: 18 mg/dL (ref 8–23)
CO2: 27 mmol/L (ref 22–32)
Calcium: 8.6 mg/dL — ABNORMAL LOW (ref 8.9–10.3)
Chloride: 98 mmol/L (ref 98–111)
Creatinine, Ser: 1.31 mg/dL — ABNORMAL HIGH (ref 0.61–1.24)
GFR, Estimated: >60 mL/min (ref >60)
Glucose, Bld: 88 mg/dL (ref 70–99)
Potassium: 3.9 mmol/L (ref 3.5–5.1)
Sodium: 137 mmol/L (ref 135–145)

## 2021-10-22 MED ORDER — OXYCODONE-ACETAMINOPHEN 5-325 MG PO TABS
1.0000 | ORAL_TABLET | Freq: Once | ORAL | Status: AC
  Administered 2021-10-22: 1 via ORAL
  Filled 2021-10-22: qty 1

## 2021-10-22 NOTE — Plan of Care (Signed)

## 2021-10-22 NOTE — Progress Notes (Addendum)
HD#1 SUBJECTIVE:  Patient Summary: Bill [name] is a 66 y.o. with a pertinent PMH of ankylosing spondylitis, CKD stage IIIa, hypertension, hyperlipidemia, CAD and recent posterior cervical fusion who presented to the ED with fevers and drainage from his surgical siteand admitted for postop wound infection.    Overnight Events: None  Interim History: Overall patient reports feeling well though he does note he is beginning to have symptoms of COVID, which he was found positive for on admission positive including body aches, worsened cough, chills, loss of taste.  He is no longer having GI upset as he was prior admission.    OBJECTIVE:  Vital Signs: Vitals:   10/21/21 1600 10/21/21 1704 10/21/21 2012 10/22/21 0500  BP: 107/66 (!) 150/73 (!) 142/67 (!) 136/59  Pulse: 83 95 91 92  Resp: 16 18 18 18   Temp:  99.5 F (37.5 C) 99.3 F (37.4 C) 98.8 F (37.1 C)  TempSrc:  Oral Oral Oral  SpO2: 97% 100% 100% 98%   Supplemental O2: Room Air SpO2: 98 %  There were no vitals filed for this visit.   Intake/Output Summary (Last 24 hours) at 10/22/2021 0645 Last data filed at 10/22/2021 [id number] Gross per 24 hour  Intake 437.19 ml  Output 120 ml  Net 317.19 ml   Net IO Since Admission: 317.19 mL [10/22/21 0645]  Physical Exam: Constitutional: No acute distress noted.  Resting comfortably in bedside recliner. Cardio: Regular rate and rhythm.  No murmurs, rubs, gallops. Pulm: Clear to auscultation bilaterally.  Normal work of breathing on room air. MSK: Negative for extremity edema. Skin: Skin is warm and dry.  Wound VAC in place over lower cervical/upper thoracic spine with some erythema surrounding wound margins; no increased warmth noted. Neuro: Alert and oriented x3.  No focal deficit noted. Psych: Normal mood and affect.  Patient Lines/Drains/Airways Status     Active Line/Drains/Airways     Name Placement date Placement time Site Days   Peripheral IV 10/21/21 20 G  Anterior;Left Hand 10/21/21  0857  Hand  1   Negative Pressure Wound Therapy Cervical Posterior 10/15/21  1632  --  7   Incision (Closed) 03/11/15 Abdomen Other (Comment) 03/11/15  1230  -- 2417   Incision (Closed) 12/09/20 Cervical 12/09/20  1033  -- 317   Incision (Closed) 09/20/21 Neck 09/20/21  1338  -- 32   Incision (Closed) 10/15/21 Cervical 10/15/21  1648  -- 7             ASSESSMENT/PLAN:  Assessment: Principal Problem:   Post-operative wound abscess   Plan: 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 ID following, appreciate their assistance in his care.  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 PICC line placed on 1/30, removed 02/02 before discharge. MRI 02/02 revealed epidural enhancing tissue along the dorsal aspect of the canal beginning in the cervical spine out of field-of-view extending to T2 level suspicious for epidural phlegmon, without definite central nonenhancing component to suggest abscess formation; no evidence of discitis/osteomyelitis and thoracic spine; no evidence of discitis/osteomyelitis in the lumbar spine. Leukocyte trend this admission 17.7>18.8.  -TTE 02/01 concerning for  mitral valve calcification vs. vegetation; will confirm if able to proceed with TEE Monday given new diagnosis of COVID-19 -PICC line placed  01/30 thought to be less likely to be contributing to progressing leukocytosis however given lab findings and blood culture growth of staph aureus in 1/4 bottles despite IV antibiotic treatment, this was removed. PIV now, can consider re-placing PICC if blood cultures negative. -8 weeks cefazolin 2 g IV every 8 hours and rifampin 300 mg twice daily -Blood cultures redrawn and pending. -Wound  vac in place  #COVID-19 infection New at this admission.  Patient does endorse body aches, chills, progressing cough with sputum production.  He denies persistent gastrointestinal upset at this time. -Symptomatic management -Flutter valve ordered   #Ankylosing spondylitis Chronic condition. We have been holding etanercept and methotrexate in the setting of active infection. -Continue folic acid 1 mg daily   #CKD 3A Chronic condition. Serum creatinine 1.31 which is roughly baseline from previous admission. -Trend BMP  #Hypokalemia #Hyponatremia Admission labs revealed hyponatremia of 132 and hypokalemia of 3.9. He received 80 mEq repletion. Morning labs reveal sodium of 137 and potassium of 3.9. -Trend BMP -If hyponatremia persists, consider discontinuing HCTZ  #Hypertension Chronic condition, managed with metoprolol 50 mg daily, lisinopril-HCTZ 20-12.5 mg daily. -Continue lisinopril-HCTZ 20-12.5 mg daily -Metoprolol 50 mg daily not restarted on admission.  Consider restarting if patient becomes persistently hypertensive.  #Hyperlipidemia Chronic condition managed with atorvastatin 20 mg daily. -Continue atorvastatin 20 mg daily  Best Practice: Diet: Regular diet IVF: Fluids: IV push only, no IV fluids VTE: Lovenox Code: Full AB: cefazolin 2 g IV TID, rifampin 300 mg PO BID Therapy Recs: None DISPO: Anticipated discharge to Home pending Medical stability, IV antibiotics, and Wound care.  Signature: Farrel Gordon, D.O.  Internal Medicine Resident, PGY-1 [facility]  Pager: [phone] 6:45 AM, 10/22/2021   Please contact the on call pager after 5 pm and on weekends at [phone].

## 2021-10-22 NOTE — Plan of Care (Signed)
  Problem: Education: Goal: Knowledge of General Education information will improve Description: Including pain rating scale, medication(s)/side effects and non-pharmacologic comfort measures Outcome: Progressing   Problem: Activity: Goal: Risk for activity intolerance will decrease Outcome: Progressing   Problem: Coping: Goal: Level of anxiety will decrease Outcome: Progressing   

## 2021-10-22 NOTE — Progress Notes (Signed)
Pt c/o of tenderness and soreness to incision site. IMTS page for prn orders.

## 2021-10-23 DIAGNOSIS — U071 COVID-19: Secondary | ICD-10-CM

## 2021-10-23 DIAGNOSIS — N1831 Chronic kidney disease, stage 3a: Secondary | ICD-10-CM

## 2021-10-23 DIAGNOSIS — T8149XA Infection following a procedure, other surgical site, initial encounter: Secondary | ICD-10-CM | POA: Diagnosis not present

## 2021-10-23 DIAGNOSIS — I129 Hypertensive chronic kidney disease with stage 1 through stage 4 chronic kidney disease, or unspecified chronic kidney disease: Secondary | ICD-10-CM

## 2021-10-23 DIAGNOSIS — R7881 Bacteremia: Secondary | ICD-10-CM | POA: Diagnosis not present

## 2021-10-23 DIAGNOSIS — E871 Hypo-osmolality and hyponatremia: Secondary | ICD-10-CM

## 2021-10-23 DIAGNOSIS — E785 Hyperlipidemia, unspecified: Secondary | ICD-10-CM

## 2021-10-23 LAB — CBC WITH DIFFERENTIAL/PLATELET
Abs Immature Granulocytes: 0.47 10*3/uL — ABNORMAL HIGH (ref 0.00–0.07)
Basophils Absolute: 0.1 10*3/uL (ref 0.0–0.1)
Basophils Relative: 1 %
Eosinophils Absolute: 0.5 10*3/uL (ref 0.0–0.5)
Eosinophils Relative: 3 %
HCT: 33.6 % — ABNORMAL LOW (ref 39.0–52.0)
Hemoglobin: 11 g/dL — ABNORMAL LOW (ref 13.0–17.0)
Immature Granulocytes: 3 %
Lymphocytes Relative: 19 %
Lymphs Abs: 3.5 10*3/uL (ref 0.7–4.0)
MCH: 30.6 pg (ref 26.0–34.0)
MCHC: 32.7 g/dL (ref 30.0–36.0)
MCV: 93.6 fL (ref 80.0–100.0)
Monocytes Absolute: 2.2 10*3/uL — ABNORMAL HIGH (ref 0.1–1.0)
Monocytes Relative: 12 %
Neutro Abs: 11.3 10*3/uL — ABNORMAL HIGH (ref 1.7–7.7)
Neutrophils Relative %: 62 %
Platelets: 318 10*3/uL (ref 150–400)
RBC: 3.59 MIL/uL — ABNORMAL LOW (ref 4.22–5.81)
RDW: 14.6 % (ref 11.5–15.5)
WBC: 18 10*3/uL — ABNORMAL HIGH (ref 4.0–10.5)
nRBC: 0 % (ref 0.0–0.2)

## 2021-10-23 LAB — BASIC METABOLIC PANEL
Anion gap: 10 (ref 5–15)
BUN: 15 mg/dL (ref 8–23)
CO2: 28 mmol/L (ref 22–32)
Calcium: 8.4 mg/dL — ABNORMAL LOW (ref 8.9–10.3)
Chloride: 97 mmol/L — ABNORMAL LOW (ref 98–111)
Creatinine, Ser: 1.21 mg/dL (ref 0.61–1.24)
GFR, Estimated: >60 mL/min (ref >60)
Glucose, Bld: 96 mg/dL (ref 70–99)
Potassium: 3.7 mmol/L (ref 3.5–5.1)
Sodium: 135 mmol/L (ref 135–145)

## 2021-10-23 MED ORDER — OXYCODONE-ACETAMINOPHEN 5-325 MG PO TABS
1.0000 | ORAL_TABLET | Freq: Three times a day (TID) | ORAL | Status: DC | PRN
  Administered 2021-10-24 – 2021-10-28 (×6): 1 via ORAL
  Filled 2021-10-23 (×7): qty 1

## 2021-10-23 MED ORDER — SALINE SPRAY 0.65 % NA SOLN
1.0000 | NASAL | Status: DC | PRN
  Filled 2021-10-23: qty 44

## 2021-10-23 MED ORDER — LIP MEDEX EX OINT
TOPICAL_OINTMENT | CUTANEOUS | Status: DC | PRN
  Filled 2021-10-23: qty 7

## 2021-10-23 NOTE — Progress Notes (Signed)
° °  Subjective: Overnight patient required pain medication for his wound.  He has no complaints this morning.  Denies any pain shortness of breath body aches, cough or chills.  States his COVID symptoms have improved.  Objective:  Vital signs in last 24 hours: Vitals:   10/22/21 0500 10/22/21 1002 10/22/21 2205 10/23/21 0430  BP: (!) 136/59  (!) 144/66 135/66  Pulse: 92  89 91  Resp: 18  18 18   Temp: 98.8 F (37.1 C)  98.7 F (37.1 C) 98.8 F (37.1 C)  TempSrc: Oral  Oral Oral  SpO2: 98%  97% 97%  Weight:  134 kg    Height:  5\' 10"  (1.778 m)     Physical Exam General: alert, appears stated age, in no acute distress HEENT: Normocephalic, atraumatic, EOM intact, conjunctiva normal CV: Regular rate and rhythm, no murmurs rubs or gallops Pulm: Clear to auscultation bilaterally, normal work of breathing Abdomen: Soft, nondistended, bowel sounds present, no tenderness to palpation MSK: No lower extremity edema Skin: Warm and dry, wound VAC in place over lower cervical spine with no erythema or warmth Neuro: Alert and oriented x3   Assessment/Plan:  Principal Problem:   Post-operative wound abscess  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  Currently on cefazolin and rifampin with plans for total of 8 weeks of treatment of presumed osteomyelitis.  MRI of prior to last discharge showed suspicious epidural phlegmon without definite evidence of abscess formation, discitis or osteomyelitis.  TTE was concerning for mitral valve calcification versus vegetation.  Plan for TEE on Monday.  Leukocytosis stable at 18.   -ID following, appreciate recommendations -TEE on Monday -Consider replacing PICC line if blood cultures remain negative -Continue cefazolin and rifampin for total of 8 weeks -Repeat blood cultures show no  growth to date -Wound VAC in place  COVID-19 infection New at this admission.  Patient states his initial symptoms of body aches, chills and cough have improved today.  Denies any symptoms at this time.  -Symptomatic management -Flutter valve ordered   Ankylosing spondylitis Chronic condition. We have been holding etanercept and methotrexate in the setting of active infection. -Continue folic acid 1 mg daily   CKD 3A -Trend BMP  Hypokalemia, resolved Hyponatremia Sodium 133.  Potassium 4.0. -Trend BMP -If hyponatremia persists, consider discontinuing HCTZ  Hypertension -Continue lisinopril-HCTZ 20-12.5 mg daily -Metoprolol 50 mg daily not restarted on admission.  Consider restarting if patient becomes persistently hypertensive.  Hyperlipidemia -Continue atorvastatin 20 mg daily   Best Practice: Diet: Regular diet IVF: none VTE: Lovenox Code: Full AB: cefazolin 2 g IV TID, rifampin 300 mg PO BID Therapy Recs: None DISPO: Anticipated discharge to Home pending Medical stability, IV antibiotics, and Wound care.  Bill [name] N, DO 10/23/2021, 7:14 AM After 5pm on weekdays and 1pm on weekends: On Call pager [phone]

## 2021-10-23 NOTE — Plan of Care (Signed)
°  Problem: Clinical Measurements: Goal: Respiratory complications will improve Outcome: Progressing   Problem: Nutrition: Goal: Adequate nutrition will be maintained Outcome: Progressing   Pt voiced covid symptoms are resolving

## 2021-10-24 ENCOUNTER — Inpatient Hospital Stay: Payer: Self-pay

## 2021-10-24 DIAGNOSIS — B9561 Methicillin susceptible Staphylococcus aureus infection as the cause of diseases classified elsewhere: Secondary | ICD-10-CM

## 2021-10-24 DIAGNOSIS — I330 Acute and subacute infective endocarditis: Secondary | ICD-10-CM

## 2021-10-24 LAB — CBC WITH DIFFERENTIAL/PLATELET
Abs Immature Granulocytes: 0.23 10*3/uL — ABNORMAL HIGH (ref 0.00–0.07)
Basophils Absolute: 0.1 10*3/uL (ref 0.0–0.1)
Basophils Relative: 1 %
Eosinophils Absolute: 0.4 10*3/uL (ref 0.0–0.5)
Eosinophils Relative: 2 %
HCT: 31.9 % — ABNORMAL LOW (ref 39.0–52.0)
Hemoglobin: 10.5 g/dL — ABNORMAL LOW (ref 13.0–17.0)
Immature Granulocytes: 1 %
Lymphocytes Relative: 23 %
Lymphs Abs: 3.8 10*3/uL (ref 0.7–4.0)
MCH: 30.9 pg (ref 26.0–34.0)
MCHC: 32.9 g/dL (ref 30.0–36.0)
MCV: 93.8 fL (ref 80.0–100.0)
Monocytes Absolute: 2.1 10*3/uL — ABNORMAL HIGH (ref 0.1–1.0)
Monocytes Relative: 13 %
Neutro Abs: 9.8 10*3/uL — ABNORMAL HIGH (ref 1.7–7.7)
Neutrophils Relative %: 60 %
Platelets: 344 10*3/uL (ref 150–400)
RBC: 3.4 MIL/uL — ABNORMAL LOW (ref 4.22–5.81)
RDW: 14.6 % (ref 11.5–15.5)
WBC: 16.4 10*3/uL — ABNORMAL HIGH (ref 4.0–10.5)
nRBC: 0 % (ref 0.0–0.2)

## 2021-10-24 LAB — BASIC METABOLIC PANEL
Anion gap: 12 (ref 5–15)
BUN: 10 mg/dL (ref 8–23)
CO2: 27 mmol/L (ref 22–32)
Calcium: 8.5 mg/dL — ABNORMAL LOW (ref 8.9–10.3)
Chloride: 94 mmol/L — ABNORMAL LOW (ref 98–111)
Creatinine, Ser: 1.11 mg/dL (ref 0.61–1.24)
GFR, Estimated: >60 mL/min (ref >60)
Glucose, Bld: 111 mg/dL — ABNORMAL HIGH (ref 70–99)
Potassium: 3.7 mmol/L (ref 3.5–5.1)
Sodium: 133 mmol/L — ABNORMAL LOW (ref 135–145)

## 2021-10-24 MED ORDER — RIVAROXABAN 10 MG PO TABS
10.0000 mg | ORAL_TABLET | Freq: Every day | ORAL | Status: DC
  Administered 2021-10-24 – 2021-10-28 (×5): 10 mg via ORAL
  Filled 2021-10-24 (×5): qty 1

## 2021-10-24 NOTE — Progress Notes (Signed)
HD#3 SUBJECTIVE:  Patient Summary: Bill [name] is a 65 y.o. with a pertinent PMH of ankylosing spondylitis, CKD stage IIIa, hypertension, hyperlipidemia, CAD and recent posterior cervical fusion who presented to the ED with fevers and drainage from his surgical siteand admitted for postop wound infection.   Overnight Events: Patient has been refusing Lovenox injection.  Interim History: Patient continues to feel well. He states that he does not intend to stay all week just for TEE. He is also requesting that we d/c anticoagulants as he does not feel that he needs it. "I feel so good that if I wasn't in here I would be out playing pool."  OBJECTIVE:  Vital Signs: Vitals:   10/22/21 2205 10/23/21 0430 10/23/21 1417 10/24/21 0731  BP: (!) 144/66 135/66 116/61 130/68  Pulse: 89 91 82 86  Resp: 18 18 18 17   Temp: 98.7 F (37.1 C) 98.8 F (37.1 C) 98.4 F (36.9 C) 98.3 F (36.8 C)  TempSrc: Oral Oral Oral Oral  SpO2: 97% 97% 97% 99%  Weight:      Height:       Supplemental O2: Room Air SpO2: 99 %  Filed Weights   10/22/21 1002  Weight: 134 kg    No intake or output data in the 24 hours ending 10/24/21 1056 Net IO Since Admission: 1,397.19 mL [10/24/21 1056]  Physical Exam: Constitutional: No acute distress; resting comfortably in bedside recliner. Cardio: Regular rate and rhythm. No murmurs, rubs, gallops. Pulm: Clear to auscultation bilaterally. Normal work of breathing on room air. MSK: Negative for extremity edema. Skin: Warm and dry. Neuro: Alert and oriented x3. No focal deficit noted. Psych: Normal mood and affect.  Patient Lines/Drains/Airways Status     Active Line/Drains/Airways     Name Placement date Placement time Site Days   Peripheral IV 10/24/21 22 G Anterior;Left;Proximal Forearm 10/24/21  0643  Forearm  less than 1   Negative Pressure Wound Therapy Cervical Posterior 10/15/21  1632  --  9   Incision (Closed) 03/11/15 Abdomen Other (Comment)  03/11/15  1230  -- 2419   Incision (Closed) 12/09/20 Cervical 12/09/20  1033  -- 319   Incision (Closed) 09/20/21 Neck 09/20/21  1338  -- 34   Incision (Closed) 10/15/21 Cervical 10/15/21  1648  -- 9             ASSESSMENT/PLAN:  Assessment: Principal Problem:   Post-operative wound abscess   Plan: 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  Currently on cefazolin and rifampin with plans for total of 8 weeks of treatment of presumed osteomyelitis.  MRI of prior to last discharge showed suspicious epidural phlegmon without definite evidence of abscess formation, discitis or osteomyelitis.  TTE was concerning for mitral valve calcification versus vegetation prompting need for TEE.  Leukocytosis slightly improved to 16.4. Blood cultures show NGTD. -ID following, appreciate recommendations  -Will follow-up regarding TEE recommendations given inability to schedule it until 02/09, mild improvement of leukocytosis, and having been on antibiotic therapy with plans to continue on discharge -TEE scheduled for Thursday 02/09 -Consider replacing PICC line as blood cultures show no growth at 2 days. -Continue cefazolin and rifampin for total of 8 weeks -Wound VAC in place   COVID-19 infection New at this admission.  Patient endorses continued improvement of his initial symptoms of body aches, chills and cough.  -Symptomatic  management -Flutter valve ordered   Ankylosing spondylitis Chronic condition. We have been holding etanercept and methotrexate in the setting of active infection. -Continue folic acid 1 mg daily   CKD 3A Creatinine improved to 1.11. -Trend BMP  Hypokalemia, resolved Hyponatremia Sodium 133.  Potassium 3.7. -Trend BMP -If hyponatremia persists, consider discontinuing  HCTZ  Hypertension -Continue lisinopril-HCTZ 20-12.5 mg daily, metoprolol 50 mg daily  Hyperlipidemia -Continue atorvastatin 20 mg daily   Best Practice: Diet: Regular diet IVF: none VTE: Xarelto 10 mg daily Code: Full AB: cefazolin 2 g IV TID, rifampin 300 mg PO BID Therapy Recs: None DISPO: Anticipated discharge to Home pending Medical stability, IV antibiotics, and Wound care.  Signature: Farrel Gordon, D.O.  Internal Medicine Resident, PGY-1 [facility]  Pager: [phone] 10:56 AM, 10/24/2021   Please contact the on call pager after 5 pm and on weekends at [phone].

## 2021-10-24 NOTE — Consult Note (Signed)
WOC Nurse Consult Note: Patient receiving care in [facility] 5N23 Wound type: Surgical cervical incision Pressure Injury POA: NA Wound bed: pink granulation tissue on left and right side of the wound. Depth is down to the bone, yellow Drainage (amount, consistency, odor) Sanguinous in canister Periwound: intact Dressing procedure/placement/frequency: Removed W/D dressing. Cleaned surrounding skin with no sting barrier film. Placed 2 pieces of black foam layered in the wound, drape applied and immediate suction obtained at 125 mmHg.   WOC will follow for vac dressing changes M/W/F. 3 black foam kits ordered to be placed at bedside.   [name], MSN, RN, CMSRN, [name], [facility] Wound Treatment Associate Pager [phone]

## 2021-10-24 NOTE — Progress Notes (Addendum)
°    [facility]    Date of Admission:  10/21/2021   Total days of antibiotics 9   ID: Bill [name] is a 66 y.o. male with  MSSA cervical fusion SSI with secondary bacteremia and presumable  native MV endocarditis. Found to be covid + on readmission on 2/3 Principal Problem:   Post-operative wound abscess    Subjective: Afebrile. He is upset that he is waiting for TEE  now slated 2/9  Medications:   allopurinol  300 mg Oral Daily   aspirin EC  81 mg Oral Daily   atorvastatin  20 mg Oral QHS   cholecalciferol  2,000 Units Oral QPM   folic acid  1 mg Oral QPM   hydrochlorothiazide  12.5 mg Oral Daily   lisinopril  20 mg Oral Daily   loratadine  10 mg Oral Daily   metoprolol tartrate  50 mg Oral Daily   montelukast  10 mg Oral QHS   rifampin  300 mg Oral Q12H   rivaroxaban  10 mg Oral Daily    Objective: Vital signs in last 24 hours: Temp:  [98.3 F (36.8 C)-98.4 F (36.9 C)] 98.3 F (36.8 C) (02/06 0731) Pulse Rate:  [82-86] 86 (02/06 0731) Resp:  [17-18] 17 (02/06 0731) BP: (116-130)/(61-68) 130/68 (02/06 0731) SpO2:  [97 %-99 %] 99 % (02/06 0731) Physical Exam  Constitutional: He is oriented to person, place, and time. He appears well-developed and well-nourished. No distress.  HENT:  Mouth/Throat: Oropharynx is clear and moist. No oropharyngeal exudate.  Cardiovascular: Normal rate, regular rhythm and normal heart sounds. Exam reveals no gallop and no friction rub.  No murmur heard.  Pulmonary/Chest: Effort normal and breath sounds normal. No respiratory distress. He has no wheezes.  Back  = wound vac in place Lymphadenopathy:  He has no cervical adenopathy.  Neurological: He is alert and oriented to person, place, and time.  Skin: Skin is warm and dry. No rash noted. No erythema.  Psychiatric: He has a normal mood and affect. His behavior is normal.     Lab Results Recent Labs    10/23/21 0255 10/24/21 0306 10/24/21 0830   WBC 18.0* 16.4*  --   HGB 11.0* 10.5*  --   HCT 33.6* 31.9*  --   NA 135  --  133*  K 3.7  --  3.7  CL 97*  --  94*  CO2 28  --  27  BUN 15  --  10  CREATININE 1.21  --  1.11     Microbiology: 2/3 blood cx NGTD Studies/Results: No results found.   Assessment/Plan: MSSA cervical SSI/ Hw involvement/osteo with phlegmon = continue on cefazolin plus oral rifampin 300mg  bid. Continue for 8 wks  Can place picc line since blood cx are clear at 72hrs Will need TEE to have better evaluation of TTE findings -highly suggestive of endocarditis  Covid-illness= asymptomatic, but CT value suggests it is infectious range. Continue on isolation for 7 addn. Days  Leukocytosis = still elevated. Will continue to monitor.  Dispo = please start on [place] approval for iv abtx plus wound vac approval since it takes 24-72hr process.  [facility] Pager: [phone]  10/24/2021, 12:52 PM

## 2021-10-25 LAB — CBC WITH DIFFERENTIAL/PLATELET
Abs Immature Granulocytes: 0.14 10*3/uL — ABNORMAL HIGH (ref 0.00–0.07)
Basophils Absolute: 0.1 10*3/uL (ref 0.0–0.1)
Basophils Relative: 1 %
Eosinophils Absolute: 0.3 10*3/uL (ref 0.0–0.5)
Eosinophils Relative: 2 %
HCT: 32.4 % — ABNORMAL LOW (ref 39.0–52.0)
Hemoglobin: 10.4 g/dL — ABNORMAL LOW (ref 13.0–17.0)
Immature Granulocytes: 1 %
Lymphocytes Relative: 23 %
Lymphs Abs: 3.3 10*3/uL (ref 0.7–4.0)
MCH: 30 pg (ref 26.0–34.0)
MCHC: 32.1 g/dL (ref 30.0–36.0)
MCV: 93.4 fL (ref 80.0–100.0)
Monocytes Absolute: 2 10*3/uL — ABNORMAL HIGH (ref 0.1–1.0)
Monocytes Relative: 14 %
Neutro Abs: 8.4 10*3/uL — ABNORMAL HIGH (ref 1.7–7.7)
Neutrophils Relative %: 59 %
Platelets: 365 10*3/uL (ref 150–400)
RBC: 3.47 MIL/uL — ABNORMAL LOW (ref 4.22–5.81)
RDW: 14.3 % (ref 11.5–15.5)
WBC: 14.2 10*3/uL — ABNORMAL HIGH (ref 4.0–10.5)
nRBC: 0 % (ref 0.0–0.2)

## 2021-10-25 NOTE — Progress Notes (Signed)
HD#4 SUBJECTIVE:  Patient Summary: Bill [name] is a 66 y.o. with a pertinent PMH of ankylosing spondylitis, CKD stage IIIa, hypertension, hyperlipidemia, CAD and recent posterior cervical fusion who presented to the ED with fevers and drainage from his surgical siteand admitted for postop wound infection.    Overnight Events: None  Interim History: Patient is doing well this morning. Still has a dry cough but is not very bothersome. He would like to make sure that [place] authorizations are in progress so that when he is medically appropriate for discharge, it is ready as well.  OBJECTIVE:  Vital Signs: Vitals:   10/23/21 1417 10/24/21 0731 10/24/21 1345 10/24/21 2049  BP: 116/61 130/68 (!) 103/57 115/65  Pulse: 82 86 81 89  Resp: 18 17 18 18   Temp: 98.4 F (36.9 C) 98.3 F (36.8 C) 98.2 F (36.8 C) 99 F (37.2 C)  TempSrc: Oral Oral Oral Oral  SpO2: 97% 99% 94% 99%  Weight:      Height:       Supplemental O2: Room Air SpO2: 99 %  Filed Weights   10/22/21 1002  Weight: 134 kg     Intake/Output Summary (Last 24 hours) at 10/25/2021 0641 Last data filed at 10/25/2021 0000 Gross per 24 hour  Intake 100 ml  Output --  Net 100 ml   Net IO Since Admission: 1,497.19 mL [10/25/21 0641]  Physical Exam: Constitutional: No acute distress. Cardio: Regular rate and rhythm. No murmurs, rubs, gallops. Pulm: Clear to auscultation bilaterally. MSK: Negative for extremity edema. Skin: Warm and dry. No lesions noted on limited exam. Neuro: Alert and oriented x3. No focal deficit noted. Psych: Normal mood and affect.  Patient Lines/Drains/Airways Status     Active Line/Drains/Airways     Name Placement date Placement time Site Days   Peripheral IV 10/24/21 22 G Anterior;Left;Proximal Forearm 10/24/21  0643  Forearm  1   Negative Pressure Wound Therapy Cervical Posterior 10/15/21  1632  --  10   Incision (Closed) 03/11/15 Abdomen Other (Comment) 03/11/15  1230  -- 2420    Incision (Closed) 12/09/20 Cervical 12/09/20  1033  -- 320   Incision (Closed) 09/20/21 Neck 09/20/21  1338  -- 35   Incision (Closed) 10/15/21 Cervical 10/15/21  1648  -- 10             ASSESSMENT/PLAN:  Assessment: Principal Problem:   Post-operative wound abscess Active Problems:   MSSA bacteremia   Plan: 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 Repeat blood cultures this admission show no growth at three days. Leukocytosis trending downwards from 16.4>14.2. Has been afebrile, hemodynamically stable. -TEE this week to further evaluate TTE finding of mitral valve calcification vs. Vegetation -Patient declined PICC line placement this morning as he had questions regarding medical management for our team; he will need PICC placed prior to discharge. -Continue cefazolin 2 g IV TID, rifampin 300 mg PO BID; total treatment of 8 weeks -Trend CBC -Wound VAC in place -Will confirm with TOC/SW that [place] authorizations have been initiated   COVID-19 infection New at this admission.  Patient endorses continued improvement of his initial symptoms of body aches, chills and cough. His cough is mostly non-productive at this time. -Symptomatic management -Flutter valve ordered  Ankylosing spondylitis Chronic condition. We have been holding etanercept and methotrexate in the setting of active infection. -Continue folic acid 1 mg daily   CKD 3A -Trend BMP  Hypokalemia, resolved Hyponatremia -Trend BMP -If hyponatremia persists, consider discontinuing HCTZ  Hypertension Normotensive at this time.  -Continue lisinopril-HCTZ 20-12.5 mg daily, metoprolol 50 mg  daily  Hyperlipidemia -Continue atorvastatin 20 mg daily  Best Practice: Diet: Regular diet IVF: Fluids: IV push only, no IV fluids VTE: rivaroxaban (XARELTO) tablet 10 mg Start: 10/24/21 1130 Code: Full AB: cefazolin 2g IV TID, rifampin 300 mg PO BID DISPO: Anticipated discharge to Home pending Medical stability, IV antibiotics, and Wound care.  Signature: Farrel Gordon, D.O.  Internal Medicine Resident, PGY-1 [facility]  Pager: [phone] 6:41 AM, 10/25/2021   Please contact the on call pager after 5 pm and on weekends at [phone].

## 2021-10-25 NOTE — TOC Progression Note (Signed)
Transition of Care [facility]) - Progression Note    Patient Details  Name: Bill [name] MRN: [id number] Date of Birth: 1956-02-20  [facility]) CM/SW Contact  Epifanio Lesches, RN Phone Number: 10/25/2021, 1:43 PM  Clinical Narrative:     NCM received text from Traci with [employer]. Trac informed NCM authorization received for wound vac, MD made aware.  TOC team with continue to monitor and assist with needs....   Expected Discharge Plan: Home w Home Health Services Barriers to Discharge: Continued Medical Work up  Expected Discharge Plan and Services Expected Discharge Plan: Home w Home Health Services                                               Social Determinants of Health (SDOH) Interventions    Readmission Risk Interventions No flowsheet data found.

## 2021-10-25 NOTE — Progress Notes (Signed)
At bedside to place PICC line. Patient stated that he would prefer PICC to placed closet to discharge date. Joyice Faster, RN aware. Will continue to monitor.

## 2021-10-26 LAB — CBC WITH DIFFERENTIAL/PLATELET
Abs Immature Granulocytes: 0.12 10*3/uL — ABNORMAL HIGH (ref 0.00–0.07)
Basophils Absolute: 0.1 10*3/uL (ref 0.0–0.1)
Basophils Relative: 1 %
Eosinophils Absolute: 0.3 10*3/uL (ref 0.0–0.5)
Eosinophils Relative: 2 %
HCT: 35.8 % — ABNORMAL LOW (ref 39.0–52.0)
Hemoglobin: 11.7 g/dL — ABNORMAL LOW (ref 13.0–17.0)
Immature Granulocytes: 1 %
Lymphocytes Relative: 32 %
Lymphs Abs: 4.8 10*3/uL — ABNORMAL HIGH (ref 0.7–4.0)
MCH: 30.9 pg (ref 26.0–34.0)
MCHC: 32.7 g/dL (ref 30.0–36.0)
MCV: 94.5 fL (ref 80.0–100.0)
Monocytes Absolute: 1.9 10*3/uL — ABNORMAL HIGH (ref 0.1–1.0)
Monocytes Relative: 13 %
Neutro Abs: 8 10*3/uL — ABNORMAL HIGH (ref 1.7–7.7)
Neutrophils Relative %: 51 %
Platelets: 441 10*3/uL — ABNORMAL HIGH (ref 150–400)
RBC: 3.79 MIL/uL — ABNORMAL LOW (ref 4.22–5.81)
RDW: 14.5 % (ref 11.5–15.5)
WBC: 15.3 10*3/uL — ABNORMAL HIGH (ref 4.0–10.5)
nRBC: 0 % (ref 0.0–0.2)

## 2021-10-26 LAB — CULTURE, BLOOD (ROUTINE X 2)
Culture: NO GROWTH
Culture: NO GROWTH

## 2021-10-26 NOTE — Progress Notes (Signed)
HD#5 SUBJECTIVE:  Patient Summary: Bill [name] is a 67 y.o. with a pertinent PMH of ankylosing spondylitis, CKD stage IIIa, hypertension, hyperlipidemia, CAD and recent posterior cervical fusion who presented to the ED with fevers and drainage from his surgical siteand admitted for postop wound infection.    Overnight Events:  None  Interim History: Patient expresses frustration over TEE being on Friday when impression has been Monday, then Thursday so far. He is also frustrated that he is still in COVID-19 isolation. I explained that originally the plan was for TEE Monday, however he discharged and this led to initial delay of test; further, when call was made to confirm timing of TEE, the first available appeared to be Thursday however when the scheduler went to secure his time, Friday was showing as first available which is why he is set for Friday at 2 p.m. We also explained that hospital isolation is longer than community isolation.  OBJECTIVE:  Vital Signs: Vitals:   10/24/21 1345 10/24/21 2049 10/25/21 0818 10/25/21 2204  BP: (!) 103/57 115/65 106/73 124/75  Pulse: 81 89 95 98  Resp: 18 18 18 20   Temp: 98.2 F (36.8 C) 99 F (37.2 C) 98.8 F (37.1 C) 98.8 F (37.1 C)  TempSrc: Oral Oral Oral Oral  SpO2: 94% 99% 95% 96%  Weight:      Height:       Supplemental O2: Room Air SpO2: 96 %  Filed Weights   10/22/21 1002  Weight: 134 kg     Intake/Output Summary (Last 24 hours) at 10/26/2021 0924 Last data filed at 10/26/2021 0700 Gross per 24 hour  Intake 560 ml  Output --  Net 560 ml   Net IO Since Admission: 2,067.19 mL [10/26/21 0924]  Physical Exam: Constitutional:Resting comfortably in bed, no acute distress noted. Cardio:Regular rate and rhythm. No murmurs, rubs, gallops. Pulm:Clear to auscultation bilaterally. Normal work of breathing on room air. [id number] for extremity edema. Skin:Warm and dry. Clean wound VAC dressing over lower cervical/upper  thoracic spine without increased warmth or erythema. Neuro:Alert and oriented x3. No focal deficit noted. Psych:Normal mood and affect.  Patient Lines/Drains/Airways Status     Active Line/Drains/Airways     Name Placement date Placement time Site Days   Peripheral IV 10/24/21 22 G Anterior;Left;Proximal Forearm 10/24/21  0643  Forearm  2   Negative Pressure Wound Therapy Cervical Posterior 10/15/21  1632  --  11   Incision (Closed) 03/11/15 Abdomen Other (Comment) 03/11/15  1230  -- 2421   Incision (Closed) 12/09/20 Cervical 12/09/20  1033  -- 321   Incision (Closed) 09/20/21 Neck 09/20/21  1338  -- 36   Incision (Closed) 10/15/21 Cervical 10/15/21  1648  -- 11             ASSESSMENT/PLAN:  Assessment: Principal Problem:   Post-operative wound abscess Active Problems:   MSSA bacteremia   Plan: 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 Leukocytosis has remained consistently in mid-teens with value today of 15.3 from 14.2 02/07. Patient is afebrile, hemodynamically stable. Given echocardiogram results 02/01 indicating possible mitral valve vegetation and meeting [facility]'s criteria for endocarditis, he remains medically indicated to undergo TEE prior to discharge. -TEE scheduled for 02/10 at 2 PM -Will place order for PICC line placement 02/09 -Continue cefazolin 2 g IV TID, rifampin 300 mg PO BID; total treatment of 8 weeks -Trend CBC -  Wound VAC in place -Will f/u with TOC/SW regarding [facility] prior authorizations   COVID-19 infection New at this admission.  Patient endorses feeling overall well. -Symptomatic management -Flutter valve -Isolation timeline per ID   Ankylosing spondylitis Chronic condition. We have been holding etanercept and methotrexate in the setting of active infection. -Continue folic acid 1 mg daily   CKD  3A -Trend BMP  Hypokalemia, resolved Hyponatremia -Trend BMP -If hyponatremia persists, consider discontinuing HCTZ  Hypertension Normotensive at this time.  -Continue lisinopril-HCTZ 20-12.5 mg daily, metoprolol 50 mg daily  Hyperlipidemia -Continue atorvastatin 20 mg daily   Best Practice: Diet: Regular diet IVF: Fluids: IV push only, no IV fluids VTE: rivaroxaban (XARELTO) tablet 10 mg Start: 10/24/21 1130 Code: Full AB: cefazolin 2g IV TID, rifampin 300 mg PO BID DISPO: Anticipated discharge to Home pending Medical stability, IV antibiotics, and Wound care.  Signature: Farrel Gordon, D.O.  Internal Medicine Resident, PGY-1 [facility]  Pager: [phone] 9:24 AM, 10/26/2021   Please contact the on call pager after 5 pm and on weekends at [phone].

## 2021-10-26 NOTE — Consult Note (Signed)
WOC Nurse Consult Note: Patient receiving care in [facility] 5N23 Wound type: Surgical cervical incision Pressure Injury POA: NA Measurement: 9 cm x 3.7 cm x 3.3 cm x 3.5 cm of undermining.  Wound bed: pink granulation tissue on left and right side of the wound. Depth is down to the bone, yellow.  Drainage (amount, consistency, odor) Sanguinous in canister Periwound: intact Dressing procedure/placement/frequency: Removed 2 black foams. Cleaned surrounding skin with no sting barrier film. Placed 2 pieces of black foam layered in the wound, drape applied and immediate suction obtained at 125 mmHg. Canister changed out today with a total of 425 sanguinous drainage.  2 foam dressing kits left in cabinet on the outside of the room.    WOC will follow for vac dressing changes M/W/F.    [name], MSN, RN, CMSRN, [name], [facility] Wound Treatment Associate Pager [phone]

## 2021-10-27 LAB — URINALYSIS, MICROSCOPIC (REFLEX)

## 2021-10-27 LAB — COMPREHENSIVE METABOLIC PANEL
ALT: 16 U/L (ref 0–44)
AST: 40 U/L (ref 15–41)
Albumin: 2.8 g/dL — ABNORMAL LOW (ref 3.5–5.0)
Alkaline Phosphatase: 75 U/L (ref 38–126)
Anion gap: 12 (ref 5–15)
BUN: 12 mg/dL (ref 8–23)
CO2: 27 mmol/L (ref 22–32)
Calcium: 8.9 mg/dL (ref 8.9–10.3)
Chloride: 95 mmol/L — ABNORMAL LOW (ref 98–111)
Creatinine, Ser: 1.59 mg/dL — ABNORMAL HIGH (ref 0.61–1.24)
GFR, Estimated: 48 mL/min — ABNORMAL LOW (ref >60)
Glucose, Bld: 142 mg/dL — ABNORMAL HIGH (ref 70–99)
Potassium: 3.9 mmol/L (ref 3.5–5.1)
Sodium: 134 mmol/L — ABNORMAL LOW (ref 135–145)
Total Bilirubin: 0.6 mg/dL (ref 0.3–1.2)
Total Protein: 7.8 g/dL (ref 6.5–8.1)

## 2021-10-27 LAB — CBC WITH DIFFERENTIAL/PLATELET
Abs Immature Granulocytes: 0.1 10*3/uL — ABNORMAL HIGH (ref 0.00–0.07)
Basophils Absolute: 0.1 10*3/uL (ref 0.0–0.1)
Basophils Relative: 1 %
Eosinophils Absolute: 0.3 10*3/uL (ref 0.0–0.5)
Eosinophils Relative: 2 %
HCT: 34.4 % — ABNORMAL LOW (ref 39.0–52.0)
Hemoglobin: 11.2 g/dL — ABNORMAL LOW (ref 13.0–17.0)
Immature Granulocytes: 1 %
Lymphocytes Relative: 27 %
Lymphs Abs: 3.4 10*3/uL (ref 0.7–4.0)
MCH: 30.5 pg (ref 26.0–34.0)
MCHC: 32.6 g/dL (ref 30.0–36.0)
MCV: 93.7 fL (ref 80.0–100.0)
Monocytes Absolute: 1.7 10*3/uL — ABNORMAL HIGH (ref 0.1–1.0)
Monocytes Relative: 14 %
Neutro Abs: 7.2 10*3/uL (ref 1.7–7.7)
Neutrophils Relative %: 55 %
Platelets: 456 10*3/uL — ABNORMAL HIGH (ref 150–400)
RBC: 3.67 MIL/uL — ABNORMAL LOW (ref 4.22–5.81)
RDW: 14.2 % (ref 11.5–15.5)
WBC: 12.8 10*3/uL — ABNORMAL HIGH (ref 4.0–10.5)
nRBC: 0 % (ref 0.0–0.2)

## 2021-10-27 LAB — URINALYSIS, ROUTINE W REFLEX MICROSCOPIC
Glucose, UA: NEGATIVE mg/dL
Ketones, ur: 15 mg/dL — AB
Leukocytes,Ua: NEGATIVE
Nitrite: NEGATIVE
Protein, ur: >300 mg/dL — AB
Specific Gravity, Urine: >1.03 — ABNORMAL HIGH (ref 1.005–1.030)
pH: 5 (ref 5.0–8.0)

## 2021-10-27 MED ORDER — SODIUM CHLORIDE 0.9% FLUSH: 10.0000 mL | INTRAVENOUS | Status: DC | PRN

## 2021-10-27 MED ORDER — CHLORHEXIDINE GLUCONATE CLOTH 2 % EX PADS
6.0000 | MEDICATED_PAD | Freq: Every day | CUTANEOUS | Status: DC
  Administered 2021-10-27 – 2021-10-28 (×2): 6 via TOPICAL

## 2021-10-27 NOTE — Progress Notes (Signed)
HD#6 SUBJECTIVE:  Patient Summary: Bill [name] is a 66 y.o. with a pertinent PMH of ankylosing spondylitis, CKD stage IIIa, hypertension, hyperlipidemia, CAD and recent posterior cervical fusion who presented to the ED with fevers and drainage from his surgical siteand admitted for postop wound infection.    Overnight Events: None  Interim History: Patient reports feeling generally well this morning. He continues to have poor appetite which is unchanged from admission but is tolerating PO intake, including water with flavor packets and mountain dew.eports feeling well.  OBJECTIVE:  Vital Signs: Vitals:   10/25/21 2204 10/26/21 1045 10/26/21 1634 10/26/21 2312  BP: 124/75 126/69 110/76 108/62  Pulse: 98 93 100 (!) 103  Resp: 20 18 17 16   Temp: 98.8 F (37.1 C) 97.7 F (36.5 C) 98.1 F (36.7 C) 99.1 F (37.3 C)  TempSrc: Oral Oral Oral Oral  SpO2: 96% 96% 91% 98%  Weight:      Height:       Supplemental O2: Room Air SpO2: 98 %  Filed Weights   10/22/21 1002  Weight: 134 kg     Intake/Output Summary (Last 24 hours) at 10/27/2021 0646 Last data filed at 10/26/2021 2130 Gross per 24 hour  Intake 200 ml  Output 105 ml  Net 95 ml   Net IO Since Admission: 2,062.19 mL [10/27/21 0646]  Physical Exam: Constitutional:Resting comfortably in bedside recliner. No acute distress. Cardio:Regular rate and rhythm. No murmurs, rubs, gallops. Pulm:Clear to auscultation bilaterally. Abdomen:Soft, nontender, nondistended. [id number] for extremity edema. Skin:Warm and dry. Neuro:Alert and oriented x3. No focal deficit noted. Psych:Normal mood and affect.  Patient Lines/Drains/Airways Status     Active Line/Drains/Airways     Name Placement date Placement time Site Days   Peripheral IV 10/24/21 22 G Anterior;Left;Proximal Forearm 10/24/21  0643  Forearm  3   Negative Pressure Wound Therapy Cervical Posterior 10/15/21  1632  --  12   Incision (Closed) 03/11/15 Abdomen  Other (Comment) 03/11/15  1230  -- 2422   Incision (Closed) 12/09/20 Cervical 12/09/20  1033  -- 322   Incision (Closed) 09/20/21 Neck 09/20/21  1338  -- 37   Incision (Closed) 10/15/21 Cervical 10/15/21  1648  -- 12             ASSESSMENT/PLAN:  Assessment: Principal Problem:   Post-operative wound abscess Active Problems:   MSSA bacteremia   Plan: 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 Blood cultures show no growth. Patient has been afebrile and hemodynamically stable. TEE is scheduled for 02/10 at 1400. -TEE tomorrow 02/10 -PICC line placement today -Continue cefazolin 2 g IV TID, rifampin 300 mg PO BID; total treatment of 8 weeks -Trend CBC -Wound VAC in place -Confirmed with CM that he is approved for wound vac and home health services  Thrombocytosis New from 02/08 with platelet count 441; increased to 456 today. Given recent post-op infection on cefepime and rifampin, as well as new COVID-19 infection diagnosed at admission, I feel that it is more likely reactive. -Trend platelet count  CKD 3A New increase in creatinine to 1.59 from 1.11 02/08. In previous admission his baseline appeared to be in range of 1.3-1.4, though it decreased to 1.04 on other lab draws. He has been managed with lisinopril to this time. Reports good PO intake. -Obtain UA -Post-void bladder scan -Hold lisinopril 20 mg daily -Trend BMP   COVID-19 infection New  at this admission.  Patient endorses feeling overall well though he does continue to have a cough. Isolation for 10 days, today is day 7.  -Symptomatic management -Flutter valve   Ankylosing spondylitis Chronic condition. We have been holding etanercept and methotrexate in the setting of active infection. -Continue folic acid 1 mg daily  Hypokalemia, resolved Hyponatremia -Trend  BMP -If hyponatremia persists, consider discontinuing HCTZ  Hypertension Normotensive at this time.  -Hold lisinopril 20 mg daily due to AKI -Continue HCTZ 20-12.5 mg daily, metoprolol 50 mg daily  Hyperlipidemia -Continue atorvastatin 20 mg daily  Best Practice: Diet: Regular diet IVF: Fluids: IV push only, no IV fluids VTE: rivaroxaban (XARELTO) tablet 10 mg Start: 10/24/21 1130 Code: Full AB: cefazolin 2g IV TID, rifampin 300 mg PO BID DISPO: Anticipated discharge to Home pending Medical stability, IV antibiotics, and Wound care.  Signature: Farrel Gordon, D.O.  Internal Medicine Resident, PGY-1 [facility]  Pager: [phone] 6:46 AM, 10/27/2021   Please contact the on call pager after 5 pm and on weekends at [phone].

## 2021-10-27 NOTE — Progress Notes (Signed)
Peripherally Inserted Central Catheter Placement  The IV Nurse has discussed with the patient and/or persons authorized to consent for the patient, the purpose of this procedure and the potential benefits and risks involved with this procedure.  The benefits include less needle sticks, lab draws from the catheter, and the patient may be discharged home with the catheter. Risks include, but not limited to, infection, bleeding, blood clot (thrombus formation), and puncture of an artery; nerve damage and irregular heartbeat and possibility to perform a PICC exchange if needed/ordered by physician.  Alternatives to this procedure were also discussed.  B[employer] Power PICC patient education guide, fact sheet on infection prevention and patient information card has been provided to patient /or left at bedside.    PICC Placement Documentation  PICC Single Lumen 10/27/21 Left Cephalic 44 cm 0 cm (Active)  Indication for Insertion or Continuance of Line Prolonged intravenous therapies;Home intravenous therapies (PICC only) 10/27/21 1331  Exposed Catheter (cm) 0 cm 10/27/21 1331  Site Assessment Clean;Dry;Intact 10/27/21 1331  Line Status Flushed;Saline locked;Blood return noted 10/27/21 1331  Dressing Type Transparent 10/27/21 1331  Dressing Status Clean;Dry;Intact 10/27/21 1331  Antimicrobial disc in place? Yes 10/27/21 1331  Safety Lock Not Applicable 10/27/21 1331  Line Care Connections checked and tightened 10/27/21 1331  Dressing Intervention New dressing 10/27/21 1331  Dressing Change Due 11/03/21 10/27/21 1331       Bill [name], Lajean Manes 10/27/2021, 1:32 PM

## 2021-10-27 NOTE — Progress Notes (Signed)
Spoke with Primary RN re: PICC order, RN will let the PICC Team know if patient is agreeable for placement. Will follow up.

## 2021-10-27 NOTE — Progress Notes (Signed)
° °  [facility] has been requested to perform a transesophageal echocardiogram on Bill [name] for bacteremia.  After careful review of history and examination, the risks and benefits of transesophageal echocardiogram have been explained including risks of esophageal damage, perforation (1:10,000 risk), bleeding, pharyngeal hematoma as well as other potential complications associated with conscious sedation including aspiration, arrhythmia, respiratory failure and death. Alternatives to treatment were discussed, questions were answered. Patient is willing to proceed.   Procedure scheduled for 10/28/2021 at 2:00pm with Dr. Mayford Knife. Will place orders.  Corrin Parker, PA-C 10/27/2021 6:47 PM

## 2021-10-27 NOTE — Plan of Care (Signed)

## 2021-10-27 NOTE — Plan of Care (Signed)
  Problem: Nutrition: Goal: Adequate nutrition will be maintained Outcome: Progressing   Problem: Coping: Goal: Level of anxiety will decrease Outcome: Progressing   Problem: Elimination: Goal: Will not experience complications related to bowel motility Outcome: Progressing   Problem: Pain Managment: Goal: General experience of comfort will improve Outcome: Progressing   

## 2021-10-28 ENCOUNTER — Other Ambulatory Visit (HOSPITAL_COMMUNITY): Payer: Self-pay

## 2021-10-28 ENCOUNTER — Inpatient Hospital Stay (HOSPITAL_COMMUNITY): Payer: No Typology Code available for payment source

## 2021-10-28 DIAGNOSIS — R7881 Bacteremia: Secondary | ICD-10-CM

## 2021-10-28 LAB — COMPREHENSIVE METABOLIC PANEL
ALT: 14 U/L (ref 0–44)
AST: 40 U/L (ref 15–41)
Albumin: 2.6 g/dL — ABNORMAL LOW (ref 3.5–5.0)
Alkaline Phosphatase: 80 U/L (ref 38–126)
Anion gap: 10 (ref 5–15)
BUN: 13 mg/dL (ref 8–23)
CO2: 28 mmol/L (ref 22–32)
Calcium: 8.8 mg/dL — ABNORMAL LOW (ref 8.9–10.3)
Chloride: 95 mmol/L — ABNORMAL LOW (ref 98–111)
Creatinine, Ser: 1.28 mg/dL — ABNORMAL HIGH (ref 0.61–1.24)
GFR, Estimated: >60 mL/min (ref >60)
Glucose, Bld: 103 mg/dL — ABNORMAL HIGH (ref 70–99)
Potassium: 3.8 mmol/L (ref 3.5–5.1)
Sodium: 133 mmol/L — ABNORMAL LOW (ref 135–145)
Total Bilirubin: 0.7 mg/dL (ref 0.3–1.2)
Total Protein: 7.3 g/dL (ref 6.5–8.1)

## 2021-10-28 LAB — CBC WITH DIFFERENTIAL/PLATELET
Abs Immature Granulocytes: 0.06 10*3/uL (ref 0.00–0.07)
Basophils Absolute: 0.1 10*3/uL (ref 0.0–0.1)
Basophils Relative: 1 %
Eosinophils Absolute: 0.2 10*3/uL (ref 0.0–0.5)
Eosinophils Relative: 2 %
HCT: 32.1 % — ABNORMAL LOW (ref 39.0–52.0)
Hemoglobin: 10.3 g/dL — ABNORMAL LOW (ref 13.0–17.0)
Immature Granulocytes: 1 %
Lymphocytes Relative: 36 %
Lymphs Abs: 3.9 10*3/uL (ref 0.7–4.0)
MCH: 29.9 pg (ref 26.0–34.0)
MCHC: 32.1 g/dL (ref 30.0–36.0)
MCV: 93.3 fL (ref 80.0–100.0)
Monocytes Absolute: 1.5 10*3/uL — ABNORMAL HIGH (ref 0.1–1.0)
Monocytes Relative: 14 %
Neutro Abs: 5.1 10*3/uL (ref 1.7–7.7)
Neutrophils Relative %: 46 %
Platelets: 464 10*3/uL — ABNORMAL HIGH (ref 150–400)
RBC: 3.44 MIL/uL — ABNORMAL LOW (ref 4.22–5.81)
RDW: 14.4 % (ref 11.5–15.5)
WBC: 10.8 10*3/uL — ABNORMAL HIGH (ref 4.0–10.5)
nRBC: 0 % (ref 0.0–0.2)

## 2021-10-28 LAB — ECHOCARDIOGRAM LIMITED
Height: 70 in
Weight: 4726.66 oz

## 2021-10-28 LAB — C-REACTIVE PROTEIN: CRP: 7.6 mg/dL — ABNORMAL HIGH (ref <1.0)

## 2021-10-28 LAB — SEDIMENTATION RATE: Sed Rate: 121 mm/hr — ABNORMAL HIGH (ref 0–16)

## 2021-10-28 SURGERY — ECHOCARDIOGRAM, TRANSESOPHAGEAL
Anesthesia: Monitor Anesthesia Care

## 2021-10-28 MED ORDER — CEFAZOLIN IV (FOR PTA / DISCHARGE USE ONLY): 2.0000 g | Freq: Three times a day (TID) | INTRAVENOUS | 0 refills | Status: AC

## 2021-10-28 MED ORDER — HYDROCHLOROTHIAZIDE 12.5 MG PO TABS
12.5000 mg | ORAL_TABLET | Freq: Every day | ORAL | 1 refills | Status: AC
Start: 2021-10-29 — End: ?
  Filled 2021-10-28: qty 30, 30d supply, fill #0

## 2021-10-28 MED ORDER — HEPARIN SOD (PORK) LOCK FLUSH 100 UNIT/ML IV SOLN
250.0000 [IU] | Freq: Once | INTRAVENOUS | 0 refills | Status: AC
  Filled 2021-10-28: qty 2.5, 1d supply, fill #0

## 2021-10-28 MED ORDER — RIFAMPIN 300 MG PO CAPS
300.0000 mg | ORAL_CAPSULE | Freq: Two times a day (BID) | ORAL | 0 refills | Status: AC
  Filled 2021-10-28: qty 60, 30d supply, fill #0

## 2021-10-28 MED ORDER — SODIUM CHLORIDE 0.9 % IV SOLN: INTRAVENOUS | Status: DC

## 2021-10-28 MED ORDER — HEPARIN SOD (PORK) LOCK FLUSH 100 UNIT/ML IV SOLN
250.0000 [IU] | INTRAVENOUS | Status: AC | PRN
  Administered 2021-10-28: 250 [IU]
  Filled 2021-10-28: qty 2.5

## 2021-10-28 NOTE — Consult Note (Signed)
WOC Nurse Consult Note: Patient receiving care in [facility] 5N23 Scheduled for TEE @ 2:00. Should be discharged following procedure provided home vac machine has arrived and patient is okay to discharge. Will check Monday.  Wound type: Surgical cervical incision Pressure Injury POA: NA Wound bed: pink granulation tissue on left and right side of the wound. Depth is down to the bone, yellow.  Drainage (amount, consistency, odor) Sanguinous in canister Periwound: intact Dressing procedure/placement/frequency: Removed drape using adhesive remover spray. Removed 2 black foams. Cleaned surrounding skin with no sting barrier film. Placed 2 pieces of black foam layered in the wound, drape applied and immediate suction obtained at 125 mmHg.  Will need to take dressing kit on Monday if patient is still here.    WOC will follow for vac dressing changes M/W/F until discharge   [place]. Tamala Julian, MSN, RN, [place], Lysle Pearl, [facility] Wound Treatment Associate Pager [phone]

## 2021-10-28 NOTE — Progress Notes (Signed)
Patient's friend arrived to pick up patient.  Patient discharged home.

## 2021-10-28 NOTE — Progress Notes (Addendum)
[facility]    Date of Admission:  10/21/2021   Total days of antibiotics 13-cefazolin plus rifampin          ID: Bill [name] is a 66 y.o. 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 No diarrhea. He is looking forward to getting home. He asked if his home wound vac portable unit could be charged prior to discharge  We discussed change of plans of getting limited TTE to evaluate if any other changes to valve function has occurred instead of getting TEE, as recommended by consulting cardiologist who I spoke with about his case and will also read imaging today. Plan to also repeat TTE at end of 8 wk course of IV abtx.  Medications:   allopurinol  300 mg Oral Daily   aspirin EC  81 mg Oral Daily   atorvastatin  20 mg Oral QHS   Chlorhexidine Gluconate Cloth  6 each Topical Daily   cholecalciferol  2,000 Units Oral QPM   folic acid  1 mg Oral QPM   hydrochlorothiazide  12.5 mg Oral Daily   loratadine  10 mg Oral Daily   metoprolol tartrate  50 mg Oral Daily   montelukast  10 mg Oral QHS   rifampin  300 mg Oral Q12H   rivaroxaban  10 mg Oral Daily    Objective: Vital signs in last 24 hours: Temp:  [98.7 F (37.1 C)-98.8 F (37.1 C)] 98.8 F (37.1 C) (02/09 2300) Pulse Rate:  [84-97] 97 (02/09 2300) Resp:  [19] 19 (02/09 2300) BP: (116-128)/(57-67) 128/67 (02/09 2300) SpO2:  [94 %-97 %] 97 % (02/09 2300)  Physical Exam  Constitutional: He is oriented to person, place, and time. He appears well-developed and well-nourished. No distress.  HENT:  Mouth/Throat: Oropharynx is clear and moist. No oropharyngeal exudate.  Cardiovascular: Normal rate, regular rhythm and normal heart sounds. Exam reveals no gallop  and no friction rub.  No murmur heard.  Neck= wound vac in place Pulmonary/Chest: Effort normal and breath sounds normal. No respiratory distress. He has no wheezes.  Abdominal: Soft. Bowel sounds are normal. He exhibits no distension. There is no tenderness.  Xt: picc line is c/d/i Neurological: He is alert and oriented to person, place, and time.  Skin: Skin is warm and dry. No rash noted. No erythema.  Psychiatric: He has a normal mood and affect. His behavior is normal.    Lab Results Recent Labs    10/27/21 0328 10/28/21 0311  WBC 12.8* 10.8*  HGB 11.2* 10.3*  HCT 34.4* 32.1*  NA 134* 133*  K 3.9 3.8  CL 95* 95*  CO2 27 28  BUN 12 13  CREATININE 1.59* 1.28*   Liver Panel Recent Labs    10/27/21 0328 10/28/21 0311  PROT 7.8 7.3  ALBUMIN 2.8* 2.6*  AST 40 40  ALT 16 14  ALKPHOS 75 80  BILITOT 0.6 0.7   Microbiology: Blood cx 2/3 NGTD Studies/Results: No results found.   Assessment/Plan: MSSA cervical fusion SSI with hardware involvement = continue on cefazolin 2gm IV Q 8hr plus rifampin 300mg  po bid. Continue with weekly LFT. Plan for 8 wks since clearance of bacteremia - on 2/3. Continue on wound vac per [facility] RN evaluation. Abtx end date 3/31.  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 + on 2/3. Currently day 7 of isolation. Give make for him to wear mask when interacting with home health staff/riding home  Dispo= plan to discharge this afternoon @1pm . Will see back in 2-3 wk in  ID clinic  Spent 30 min discussion of treatment plan and coordination of care --------------------- Addendum = repeat limited TTE did not show any vegetation, possibly an overcall on the first TTE per cardiology  [facility] for Infectious Diseases Pager: [phone]  10/28/2021, 9:47 AM

## 2021-10-28 NOTE — Discharge Summary (Signed)
Name: Bill [name] MRN: [id number] DOB: 09/16/1956 66 y.o. PCP: [facility], [facility]  Date of Admission: 10/21/2021  8:19 AM Date of Discharge:  10/28/2020 Attending Physician: Dr.  Cain Sieve  DISCHARGE DIAGNOSIS:  Primary Problem: Post-operative wound abscess   Hospital Problems: Principal Problem:   Post-operative wound abscess Active Problems:   MSSA bacteremia    DISCHARGE MEDICATIONS:   Allergies as of 10/28/2021       Reactions   Amlodipine Swelling   Duloxetine Other (See Comments)   Other reaction(s): MALE ERECTILE DISORDER        Medication List     STOP taking these medications    etanercept 50 MG/ML injection Commonly known as: ENBREL   lisinopril-hydrochlorothiazide 20-12.5 MG tablet Commonly known as: ZESTORETIC   methotrexate 2.5 MG tablet Commonly known as: RHEUMATREX   oxyCODONE 5 MG immediate release tablet Commonly known as: Oxy IR/ROXICODONE       TAKE these medications    allopurinol 300 MG tablet Commonly known as: ZYLOPRIM Take 300 mg by mouth daily.   aspirin EC 81 MG tablet Take 1 tablet (81 mg total) by mouth daily. Swallow whole. Restart on 12/14/20   atorvastatin 20 MG tablet Commonly known as: LIPITOR Take 20 mg by mouth at bedtime.   ceFAZolin  IVPB Commonly known as: ANCEF Inject 2 g into the vein every 8 (eight) hours. Indication:  MSSA cervical spine osteo First Dose: Yes Last Day of Therapy:  12/16/21 Labs - Once weekly:  CBC/D and BMP, Labs - Every other week:  ESR and CRP Method of administration: IV Push Please pull PICC at the completion of therapy Method of administration may be changed at the discretion of home infusion pharmacist based upon assessment of the patient and/or caregiver's ability to self-administer the medication ordered.   cetirizine 10 MG tablet Commonly known as: ZYRTEC Take 10 mg by mouth at bedtime.   colchicine 0.6 MG tablet Take 0.6 mg by mouth daily as needed (Gout).    folic acid 1 MG tablet Commonly known as: FOLVITE Take 1 mg by mouth every evening.   hydrochlorothiazide 12.5 MG tablet Commonly known as: HYDRODIURIL Take 1 tablet (12.5 mg total) by mouth daily. Start taking on: October 29, 2021   metoprolol tartrate 50 MG tablet Commonly known as: LOPRESSOR Take 50 mg by mouth daily.   montelukast 10 MG tablet Commonly known as: SINGULAIR Take 10 mg by mouth at bedtime.   rifampin 300 MG capsule Commonly known as: RIFADIN Take 1 capsule (300 mg total) by mouth every 12 (twelve) hours. End 3/31 What changed: additional instructions   Vitamin D 50 MCG (2000 UT) tablet Take 2,000 Units by mouth every evening.               Discharge Care Instructions  (From admission, onward)           Start     Ordered   10/28/21 0000  Change dressing on IV access line weekly and PRN  (Home infusion instructions - [facility] )        10/28/21 1116   10/28/21 0000  Discharge wound care:       Comments: Per wound care RN.   10/28/21 1117            DISPOSITION AND FOLLOW-UP:  Bill [name] was discharged from [facility] in Stable condition. 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 Trend CBC to monitor recurrent leukocytosis.    Thrombocytosis Trend CBC to monitor for resolution    CKD 3A Recheck UA for resolution of abnormalities discussed in hospital course.   Hypertension Hyponatremia Assess antihypertensive management. Lisinopril was discontinued on 02/09 given concerns for new AKI and patient had stable but persistent hyponatremia on HCTZ.   Ankylosing spondylitis Etanercept and methotrexate were held during stay due to AKI. Please  monitor for resolution of AKI so that these may be resumed.  Follow-up Recommendations: Consults: Neurosurgeon, Infectious Disease Labs: CBC, Comprehensive Metabolic Panel, and Urinalysis Studies: None Medications: Antibiotics: cefazolin 2 g IV TID, rifampin 300 mg PO BID. Consider discontinuing lisinopril due to AKI concerns, HCTZ given hyponatremia. Holding etanercept and methotrexate on discharge due to AKI; will need to monitor for resolution to determine when patient can restart these medications.  Follow-up Appointments:  Follow-up Information     [facility]. Schedule an appointment as soon as possible for a visit in 1 week(s).   Contact information: [place] [address] [phone]         [name], MD Follow up on 11/21/2021.   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 Vegetations. Repeat blood cultures at time of admission were negative. Leukocytosis of 17.7 at that time but has trended towards normal with day of discharge level of 10.8. He was continued on cefazolin 2 g IV TID and rifampin 300 mg PO BID during admission. PICC line was placed 02/09. The decision was made to obtain repeat TTE rather than TEE on 02/10 which showed LV EF 60-65% with normal function and no regional wall motion abnormalities; RV with normal function and size; normal mitral valve with mild mitral valve regurgitation and no stenosis; normal aortic valve.   Thrombocytosis New thrombocytosis noted on 02/08 with  value of 464 at time of discharge.   CKD 3A Baseline serum creatinine appears to be roughly 1.3-1.4. Renal function was stable throughout admission with bump in serum creatinine to 1.59 02/09. At that time lisinopril was discontinued and UA was collected which showed moderate hemoglobin, small bilirubin, ketones 15, and protein >300; microscopy showed few bacteria, granular casts, waxy casts, and amorphous crystals. With discontinuation of lisinopril serum creatinine improved to 1.28 which is roughly patient's baseline. Patient reported adequate urine output at time of discharge.   Hypertension Chronic condition. Patient was managed with home lisinopril-HCTZ 20-12.5 mg daily, metoprolol 50 mg daily. Lisinopril was discontinued on 02/09 given concerns for new AKI. He remained normotensive.   COVID-19 infection Symptomatically managed. At time of discharge he is on day 8 of 10 for isolation.   Ankylosing spondylitis Chronic condition. Home medications of etanercept and methotrexate were held during stay. He was continued on folic acid 1 mg daily.  Hyponatremia Stable during this admission. Day of discharge Na of 133.  Hyperlipidemia Chronic condition. Continued on home atorvastatin 20 mg daily.   DISCHARGE INSTRUCTIONS:  Discharge Instructions     [facility] pharmacist to adjust dose for Vancomycin, Aminoglycosides and other anti-infective therapies as requested by physician.   Complete by: As directed    [facility] to provide Cath Flo 62m   Complete by: As directed    Administer for PICC line occlusion and as ordered by physician for other access device issues.   Anaphylaxis Kit: Provided to treat any anaphylactic reaction to the medication being provided to the patient if First Dose or when requested by physician   Complete by: As directed    Epinephrine 144mml vial / amp: Administer 0.95m59m0.95ml24mubcutaneously once for moderate to severe anaphylaxis, nurse to  call physician and pharmacy when reaction occurs and call 911 if needed for immediate care   Diphenhydramine 50mg66mIV vial: Administer 25-50mg 72mM PRN for first dose reaction, rash, itching, mild reaction, nurse to call physician and pharmacy when reaction occurs   Sodium Chloride 0.9% NS 500ml I2mdminister if needed for hypovolemic blood pressure drop or as ordered by physician after call to physician with anaphylactic reaction   Call MD for:  persistant nausea and vomiting   Complete by: As directed    Call MD for:  redness, tenderness, or signs of infection (pain, swelling, redness, odor or green/yellow discharge around incision site)   Complete by: As directed    Call MD for:  severe uncontrolled pain   Complete by: As directed    Call MD for:  temperature >100.4   Complete by: As directed    Change dressing on IV access line weekly and PRN   Complete by: As directed    Diet - low sodium heart healthy   Complete by: As directed    Discharge wound care:   Complete by: As directed    Per wound care RN.   Flush IV access with Sodium Chloride 0.9% and Heparin 10 units/ml or 100 units/ml   Complete by: As directed    Home infusion instructions - [facility]   Complete by: As directed    Instructions: Flush IV access with Sodium Chloride 0.9% and Heparin 10units/ml or 100units/ml   Change dressing on IV access line: Weekly and PRN   Instructions Cath Flo 2mg: Ad79mister for PICC Line occlusion and as ordered by physician for other access device   [facility] pharmacist to adjust dose for: Vancomycin, Aminoglycosides and other anti-infective therapies as requested by physician   Increase activity slowly   Complete by: As directed    Method of administration may be changed at the discretion of home infusion pharmacist based upon assessment of the patient and/or caregivers ability to self-administer the medication ordered   Complete by: As directed         SUBJECTIVE:  Patient evaluated at bedside on day of discharge. He reports feeling well at this time and is eager to go home. He has no complaints or concerns. Discharge Vitals:   BP 128/67 (BP Location: Right Arm)    Pulse 97    Temp 98.8 F (37.1 C) (Oral)    Resp 19    Ht 5' 10" (1.778 m)    Wt 134 kg    SpO2 97%    BMI 42.39 kg/m   OBJECTIVE:  Constitutional:Pleasant gentleman resting comfortably in bedside recliner. Cardio:Regular rate and rhythm. No murmurs, rubs, gallops. Pulm:Clear to auscultation bilaterally. Abdomen:Soft, nontender, nondistended. MSK:NegaTGG:YIRSWNIOremity edema. Skin:Warm and dry. No rashes or lesions noted. Wound VAC in  place over lower cervical-upper thoracic spine. Neuro:Alert and oriented x3. No focal deficit noted. Psych:Normal mood and affect.  Pertinent Labs, Studies, and Procedures:  CBC Latest Ref Rng & Units 10/28/2021 10/27/2021 10/26/2021  WBC 4.0 - 10.5 K/uL 10.8(H) 12.8(H) 15.3(H)  Hemoglobin 13.0 - 17.0 g/dL 10.3(L) 11.2(L) 11.7(L)  Hematocrit 39.0 - 52.0 % 32.1(L) 34.4(L) 35.8(L)  Platelets 150 - 400 K/uL 464(H) 456(H) 441(H)    CMP Latest Ref Rng & Units 10/28/2021 10/27/2021 10/24/2021  Glucose 70 - 99 mg/dL 103(H) 142(H) 111(H)  BUN 8 - 23 mg/dL _0 Creatinine 0.61 - 1.24 mg/dL 1.28(H) 1.59(H) 1.11  Sodium 135 - 145 mmol/L 133(L) 134(L) 133(L)  Potassium 3.5 - 5.1 mmol/L 3.8 3.9 3.7  Chloride 98 - 111 mmol/L 95(L) 95(L) 94(L)  CO2 22 - 32 mmol/L _1 Calcium 8.9 - 10.3 mg/dL 8.8(L) 8.9 8.5(L)  Total Protein 6.5 - 8.1 g/dL 7.3 7.8 -  Total Bilirubin 0.3 - 1.2 mg/dL 0.7 0.6 -  Alkaline Phos 38 - 126 U/L 80 75 -  AST 15 - 41 U/L 40 40 -  ALT 0 - 44 U/L 14 16 -    No results found.   Signed: Farrel Gordon, D.O.  Internal Medicine Resident, PGY-1 [facility]  Pager: [phone] 1:14 PM, 10/28/2021

## 2021-10-28 NOTE — TOC Transition Note (Addendum)
Transition of Care [facility]) - CM/SW Discharge Note   Patient Details  Name: Bill [name] MRN: [id number] Date of Birth: [birth year]  [facility] CM/SW Contact:  Epifanio Lesches, RN Phone Number: 10/28/2021, 1:51 PM   Clinical Narrative:    Patient will DC to: home  Anticipated DC date: 10/28/2021 Family notified: yes Transport by: car        - MSSA endocarditis 2/2 post-operative wound infection NCM delivered [employer] wound vac to pt's bedside after proof of delivery form signed by pt. NCM faxed signed from to [facility]/ [employer] @ [phone].  Per MD patient ready for DC today. RN, patient, [facility] and [facility] notified of DC.   Pt without Rx med concerns.  Post hospital f/u noted on AVS.   Friend to provide transportation to home by  6pm.  RNCM will sign off for now as intervention is no longer needed. Please consult [place] again if new needs arise.    Final next level of care: Home w Home Health Services Barriers to Discharge: No Barriers Identified   Patient Goals and CMS Choice     Choice offered to / list presented to : Patient  Discharge Placement                       Discharge Plan and Services                DME Arranged: Other see comment (IV ABX therapy) DME Agency: Other - Comment ([facility]) Date DME Agency Contacted: 10/28/21 Time DME Agency Contacted: 1349 Representative spoke with at DME Agency: Pam HH Arranged: RN HH Agency: [facility] Date [facility] Agency Contacted: 10/28/21 Time HH Agency Contacted: 1349 Representative spoke with at [facility] Agency: Kandee Keen  Social Determinants of Health (SDOH) Interventions     Readmission Risk Interventions No flowsheet data found.

## 2021-10-28 NOTE — Hospital Course (Addendum)
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 Vegetations. Repeat blood cultures at time of admission were negative. Leukocytosis of 17.7 at that time but has trended towards normal with day of discharge level of 10.8. He was continued on cefazolin 2 g IV TID and rifampin 300 mg PO BID during admission. PICC line was placed 02/09. The decision was made to obtain repeat TTE rather than TEE on 02/10 which showed LV EF 60-65% with normal function and no regional wall motion abnormalities; RV with normal function and size; normal mitral valve with mild mitral valve regurgitation and no stenosis; normal aortic valve.   Thrombocytosis New thrombocytosis noted on 02/08 with value of 464 at time of discharge.   CKD 3A Baseline serum creatinine appears to be roughly 1.3-1.4. Renal function was stable throughout admission with bump in serum creatinine to 1.59 02/09. At that time lisinopril was discontinued and UA was collected which showed moderate hemoglobin, small bilirubin, ketones 15, and protein >300; microscopy showed few bacteria, granular casts, waxy casts, and amorphous crystals. With discontinuation of lisinopril serum creatinine improved to 1.28 which is roughly patient's baseline. Patient reported adequate urine output at time of discharge.   Hypertension Chronic condition. Patient was managed with home lisinopril-HCTZ 20-12.5 mg daily, metoprolol 50 mg daily. Lisinopril was discontinued on 02/09 given concerns for new AKI. He remained normotensive.   COVID-19 infection Symptomatically managed. At time of discharge he is on day 8 of 10 for isolation.   Ankylosing spondylitis Chronic condition. Home medications of etanercept and methotrexate were held during stay.  He was continued on folic acid 1 mg daily.  Hyponatremia Stable during this admission. Day of discharge Na of 133.  Hyperlipidemia Chronic condition. Continued on home atorvastatin 20 mg daily.

## 2021-10-28 NOTE — Progress Notes (Signed)
Patient was able to arrange for a friend to pick him up after 6 pm.  Will let the front desk know when friend is on the way.

## 2021-10-28 NOTE — Progress Notes (Signed)
PICC line flushed and extension hooked up by IV team.  Discharge instructions given to patient.  Education emphasized on signs of infection to watch for.  Patient verbalized understanding.  Posterior neck wound vac dressing clean, dry, and intact.  Portable wound vac hooked up, on continuous -125 mmHG suction.  Wound vac supplies at bedside.  Home infusion team has communicated to the patient and all set up.  Patient is waiting to hear from [facility] team on transportation.

## 2021-10-28 NOTE — Progress Notes (Signed)
PHARMACY CONSULT NOTE FOR:  OUTPATIENT  PARENTERAL ANTIBIOTIC THERAPY (OPAT)  Indication: MSSA cervical spine surgical osteomyelitis  Regimen: Cefazolin 2g IV q8h + Rifampin 300 mg po bid End date: 12/16/21  IV antibiotic discharge orders are pended. To discharging provider:  please sign these orders via discharge navigator,  Select New Orders & click on the button choice - Manage This Unsigned Work.     Thank you for allowing pharmacy to be a part of this patient's care.  Sharin Mons, PharmD, BCPS, BCIDP Infectious Diseases Clinical Pharmacist Phone: [phone] 10/28/2021 9:59 AM

## 2021-10-28 NOTE — Progress Notes (Signed)
°  Echocardiogram 2D Echocardiogram has been performed.  Delcie Roch 10/28/2021, 10:15 AM

## 2021-11-21 ENCOUNTER — Inpatient Hospital Stay: Payer: No Typology Code available for payment source | Admitting: Internal Medicine

## 2022-02-16 ENCOUNTER — Ambulatory Visit: Payer: No Typology Code available for payment source | Attending: Neurological Surgery

## 2022-02-16 DIAGNOSIS — R293 Abnormal posture: Secondary | ICD-10-CM | POA: Insufficient documentation

## 2022-02-16 DIAGNOSIS — M6281 Muscle weakness (generalized): Secondary | ICD-10-CM | POA: Diagnosis present

## 2022-02-16 DIAGNOSIS — M542 Cervicalgia: Secondary | ICD-10-CM | POA: Insufficient documentation

## 2022-02-16 DIAGNOSIS — M5412 Radiculopathy, cervical region: Secondary | ICD-10-CM | POA: Insufficient documentation

## 2022-02-16 NOTE — Therapy (Signed)
OUTPATIENT PHYSICAL THERAPY CERVICAL EVALUATION   Patient Name: Bill [name] MRN: [id number] DOB:Dec 05, 1955, 66 y.o., male Today's Date: 02/16/2022   PT End of Session - 02/16/22 1523     Visit Number 1    Number of Visits 15    Date for PT Re-Evaluation 04/13/22    Authorization Type [facility]    PT Start Time 1525    PT Stop Time 1600    PT Time Calculation (min) 35 min    Activity Tolerance Patient tolerated treatment well    Behavior During Therapy WFL for tasks assessed/performed             Past Medical History:  Diagnosis Date   ADHD (attention deficit hyperactivity disorder)    Anemia    Ankylosing spondylitis ([place])    Chronic kidney disease    Coronary artery disease    Depression    Gout    Hearing loss    Hyperlipidemia    Hypertension    Morbid obesity with BMI of 40.0-44.9, adult ([place])    Myocardial infarction ([place])    OSA treated with BiPAP    Restrictive lung disease    Spondylarthritis    Past Surgical History:  Procedure Laterality Date   ANTERIOR CERVICAL DECOMP/DISCECTOMY FUSION N/A 12/09/2020   Procedure: Cervical Six-Seven Anterior Cervical Decompression/Discectomy Fusio;  Surgeon: Judith Part, MD;  Location: [place];  Service: Neurosurgery;  Laterality: N/A;  3C   ANTERIOR FUSION CERVICAL SPINE  ~ 2010   "carbon cage C3-C7"   BACK SURGERY     HERNIA REPAIR  03/11/2015   INCISIONAL HERNIA REPAIR  03/11/2015   w/mesh   INCISIONAL HERNIA REPAIR N/A 03/11/2015   Procedure: OPEN INCISIONAL HERNIA REPAIR WITH MESH ;  Surgeon: Coralie Keens, MD;  Location: [place];  Service: General;  Laterality: N/A;   INGUINAL HERNIA REPAIR Bilateral early 2000's; 03/11/2015   w/mesh; w/mesh   INGUINAL HERNIA REPAIR Bilateral 03/11/2015   Procedure: LAPAROSCOPIC CONVERTED TO BILATERAL INGUINAL HERNIA REPAIR WITH MESH;  Surgeon: Coralie Keens, MD;  Location: [place];  Service: General;  Laterality: Bilateral;   KNEE ARTHROSCOPY Left ~ 2009   LAPAROSCOPIC  CHOLECYSTECTOMY  ~ 2012   NASAL SINUS SURGERY  2005   POSTERIOR CERVICAL FUSION/FORAMINOTOMY N/A 09/20/2021   Procedure: Cervical five to Cervical seven Posterior instrumented fusion;  Surgeon: Judith Part, MD;  Location: [place];  Service: Neurosurgery;  Laterality: N/A;   POSTERIOR CERVICAL FUSION/FORAMINOTOMY N/A 10/15/2021   Procedure: IRRIGATION AND DEBRIDEMENT OF POSTERIOR CERVICAL WOUND WITH PLACEMENT OF WOUND VAC;  Surgeon: Vallarie Mare, MD;  Location: [place];  Service: Neurosurgery;  Laterality: N/A;   POSTERIOR LUMBAR FUSION     L4-L5 screws and rods   TONSILLECTOMY     Patient Active Problem List   Diagnosis Date Noted   Post-operative wound abscess 10/21/2021   MSSA bacteremia    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)    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[place]) 10/16/2021   Postoperative wound infection 10/15/2021   Ankylosing spondylitis ([place]) 10/15/2021   Hypertension 10/15/2021   Hyperlipidemia 10/15/2021   Chronic renal insufficiency 10/15/2021   Coronary artery disease 10/15/2021   OSA (obstructive sleep apnea) 10/15/2021   Pseudoarthrosis of cervical spine ([place]) 09/20/2021   Cervical radiculopathy 12/09/2020   Bilateral inguinal hernia 03/11/2015    PCP: [facility] PROVIDER: Judith Part, MD  REFERRING DIAG: M54.12 (ICD-10-CM) - Radiculopathy, cervical region  THERAPY DIAG:  Cervicalgia  Muscle weakness (generalized)  Abnormal posture  Rationale for Evaluation and Treatment Rehabilitation  ONSET DATE: 09/20/2021  SUBJECTIVE:                                                                                                                                                                                                         SUBJECTIVE STATEMENT: Pt presents to PT s/p C5-C7 spinal fusion on 09/20/2021 with subsequent MSSA endocarditis and bacteremia infection at surgical site. He discharged from acute  care on 10/28/2021 with wound vac at surgical site. He also notes significant deficits in L shoulder after a rotator cuff repair from the previous year, is L hand dominant. Had wound vac removed on 01/04/2022 and has to keep a bandage cover on it for the next few weeks. Has some numbness in his finger tips in bilateral hands, otherwise denies any other paresthesias.   PERTINENT HISTORY:  Ankylosing spondylitis, HTN, Endocarditis  PAIN:  Are you having pain?  Yes: NPRS scale: 3/10 (7/10 at worst) Pain location: posterior neck Pain description: dull ache Aggravating factors: walking, pool, prolonged standing Relieving factors: R shoulder elevation  PRECAUTIONS: None  WEIGHT BEARING RESTRICTIONS No  FALLS:  Has patient fallen in last 6 months? No  LIVING ENVIRONMENT: Lives with: lives with their family and lives alone Lives in: House/apartment Stairs: Yes: External: 3 steps; bilateral but cannot reach both Has following equipment at home: None  OCCUPATION: Retail buyer  PLOF: Independent and Independent with basic ADLs  PATIENT GOALS: decrease pain in neck, improve posture and L UE strength  OBJECTIVE:   DIAGNOSTIC FINDINGS:  N/A  PATIENT SURVEYS:  FOTO: 47% function; 56% predicted  COGNITION: Overall cognitive status: Within functional limits for tasks assessed  SENSATION: Decreased light touch in distal fingers bilateral UE  POSTURE:  rounded shoulders, forward head, and increased thoracic kyphosis  PALPATION: TTP to R upper trap   CERVICAL ROM:   Active ROM A/PROM (deg) eval  Flexion   Extension 10  Right lateral flexion   Left lateral flexion   Right rotation 15  Left rotation 45   (Blank rows = not tested)  UPPER EXTREMITY ROM:  AROM Right 02/16/2022 Left 02/16/2022  Shoulder flexion WNL WNL  Shoulder extension    Shoulder abduction WNL WNL  Shoulder internal rotation    Shoulder external rotation    Elbow flexion    Elbow  extension    Wrist flexion    Wrist extension    (Blank rows = not tested)    UPPER EXTREMITY MMT:  MMT Right 02/16/22   Left  02/16/22   Shoulder flexion 4/5 4/5  Shoulder abduction 4/5 4/5  Shoulder ER 4/5 4/5  Shoulder IR 4/5 4/5  Middle trapezius    Lower trapezius    Shoulder extension    Grip strength    Cervical flexion (C1,C2)    Cervical S/B (C3)    Shoulder shrug (C4)    Elbow flexion (C6) 5/5 5/5  Elbow ext (C7) 5/5 5/5  Thumb ext (C8) 5/5 5/5  Finger abd (T1) 5/5 5/5  (Blank rows = not tested)  CERVICAL SPECIAL TESTS:  N/A  FUNCTIONAL TESTS:  N/A   TODAY'S TREATMENT:  [facility] Adult PT Treatment:                                                DATE: 02/16/2022 Therapeutic Exercise: Row black TB x 10  Seated bilateral ER with scap retraction x 10 GTB Cervical ext SNAG with towel x 5 - 5" hold Cervical rot SNAG with towel x 5 - 5" hold Supine chin tuck x 5 - 5" hold  PATIENT EDUCATION:  Education details: eval findings, FOTO, HEP, POC Person educated: Patient Education method: Explanation, Demonstration, and Handouts Education comprehension: verbalized understanding and returned demonstration  HOME EXERCISE PROGRAM: Access Code: [email or web address] URL: [email or web address] Date: 02/16/2022 Prepared by: Octavio Manns  Exercises - Standing Shoulder Row with Anchored Resistance  - 1 x daily - 7 x weekly - 3 sets - 10 reps - Shoulder External Rotation and Scapular Retraction with Resistance  - 1 x daily - 7 x weekly - 3 sets - 10 reps - cervical extension snag with towel  - 1 x daily - 7 x weekly - 2 sets - 10 reps - 5 sec hold - Seated Assisted Cervical Rotation with Towel  - 1 x daily - 7 x weekly - 2 sets - 10 reps - 5 sec hold - Supine Chin Tuck  - 1 x daily - 7 x weekly - 2-3 sets - 10 reps - 5 sec hold  ASSESSMENT:  CLINICAL IMPRESSION: Patient is a 66 y.o. M who was seen today for physical therapy evaluation and treatment for neck and UE  pain s/p cervical spine fusion and subsequent infection. Physical findings are consistent with surgery and medical timeline, as pt demonstrates decreased UE strength and cervical ROM. His FOTO score indicates decreased functional ability, showing he is operating below PLOF. Pt would benefit from skilled PT services and will continue to be seen and progress as tolerated.     OBJECTIVE IMPAIRMENTS decreased activity tolerance, decreased endurance, decreased mobility, difficulty walking, decreased ROM, decreased strength, hypomobility, impaired flexibility, and pain.   ACTIVITY LIMITATIONS carrying, lifting, standing, transfers, and bathing  PARTICIPATION LIMITATIONS: cleaning, laundry, occupation, and yard work  PERSONAL FACTORS Past/current experiences, Time since onset of injury/illness/exacerbation, and 3+ comorbidities: Ankylosing spondylitis, HTN, Endocarditis  are also affecting patient's functional outcome.   REHAB POTENTIAL: Good  CLINICAL DECISION MAKING: Evolving/moderate complexity  EVALUATION COMPLEXITY: Moderate   GOALS: Goals reviewed with patient? No  SHORT TERM GOALS: Target date: 03/09/2022   Pt will be compliant and knowledgeable with initial HEP for improved comfort and carryover Baseline: initial HEP given  Goal status: INITIAL  2.  Pt will self report neck pain no greater than 5/10 for improved comfort and functional ability Baseline: 7/10 at  worst Goal status: INITIAL  LONG TERM GOALS: Target date: 04/13/2022  Pt will improve FOTO function score to no less than 56% as proxy for functional improvement Baseline: 47% function Goal status: INITIAL  2.  Pt will self report neck pain no greater than 2/10 for improved comfort and functional ability Baseline: 7/10 at worst Goal status: INITIAL  3.  Pt will improve R cervical rotation to no less than 30 degrees for improve comfort and functional ability Baseline: 15 deg Goal status: INITIAL  4.  Pt will improve L  shoulder IR/ER to no less than 5/5 for improved functional ability to perform ADLs Baseline: 4/5 Goal status: INITIAL  PLAN: PT FREQUENCY: 2x/week  PT DURATION: 8 weeks  PLANNED INTERVENTIONS: Therapeutic exercises, Therapeutic activity, Neuromuscular re-education, Balance training, Gait training, Patient/Family education, Joint mobilization, Electrical stimulation, Cryotherapy, Moist heat, Manual therapy, and Re-evaluation  PLAN FOR NEXT SESSION: Assess HEP response, progress DNF endurance and periscapular strength   Ward Chatters, PT 02/16/2022, 6:14 PM

## 2022-02-19 NOTE — Therapy (Unsigned)
OUTPATIENT PHYSICAL THERAPY TREATMENT NOTE   Patient Name: Bill [name] MRN: [id number] DOB:[birth year], 66 y.o., male Today's Date: 02/20/2022  PCP: [facility] REFERRING PROVIDER: [facility]  END OF SESSION:   PT End of Session - 02/20/22 1059     Visit Number 2    Number of Visits 15    Date for PT Re-Evaluation 04/13/22    Authorization Type [facility]    PT Start Time 1045    PT Stop Time 1125    PT Time Calculation (min) 40 min    Activity Tolerance Patient tolerated treatment well    Behavior During Therapy WFL for tasks assessed/performed             Past Medical History:  Diagnosis Date   ADHD (attention deficit hyperactivity disorder)    Anemia    Ankylosing spondylitis ([place])    Chronic kidney disease    Coronary artery disease    Depression    Gout    Hearing loss    Hyperlipidemia    Hypertension    Morbid obesity with BMI of 40.0-44.9, adult ([place])    Myocardial infarction ([place])    OSA treated with BiPAP    Restrictive lung disease    Spondylarthritis    Past Surgical History:  Procedure Laterality Date   ANTERIOR CERVICAL DECOMP/DISCECTOMY FUSION N/A 12/09/2020   Procedure: Cervical Six-Seven Anterior Cervical Decompression/Discectomy Fusio;  Surgeon: Judith Part, MD;  Location: [place];  Service: Neurosurgery;  Laterality: N/A;  3C   ANTERIOR FUSION CERVICAL SPINE  ~ 2010   "carbon cage C3-C7"   BACK SURGERY     HERNIA REPAIR  03/11/2015   INCISIONAL HERNIA REPAIR  03/11/2015   w/mesh   INCISIONAL HERNIA REPAIR N/A 03/11/2015   Procedure: OPEN INCISIONAL HERNIA REPAIR WITH MESH ;  Surgeon: Coralie Keens, MD;  Location: [place];  Service: General;  Laterality: N/A;   INGUINAL HERNIA REPAIR Bilateral early 2000's; 03/11/2015   w/mesh; w/mesh   INGUINAL HERNIA REPAIR Bilateral 03/11/2015   Procedure: LAPAROSCOPIC CONVERTED TO BILATERAL INGUINAL HERNIA REPAIR WITH MESH;  Surgeon: Coralie Keens, MD;  Location: [place];   Service: General;  Laterality: Bilateral;   KNEE ARTHROSCOPY Left ~ 2009   LAPAROSCOPIC CHOLECYSTECTOMY  ~ 2012   NASAL SINUS SURGERY  2005   POSTERIOR CERVICAL FUSION/FORAMINOTOMY N/A 09/20/2021   Procedure: Cervical five to Cervical seven Posterior instrumented fusion;  Surgeon: Judith Part, MD;  Location: [place];  Service: Neurosurgery;  Laterality: N/A;   POSTERIOR CERVICAL FUSION/FORAMINOTOMY N/A 10/15/2021   Procedure: IRRIGATION AND DEBRIDEMENT OF POSTERIOR CERVICAL WOUND WITH PLACEMENT OF WOUND VAC;  Surgeon: Vallarie Mare, MD;  Location: [place];  Service: Neurosurgery;  Laterality: N/A;   POSTERIOR LUMBAR FUSION     L4-L5 screws and rods   TONSILLECTOMY     Patient Active Problem List   Diagnosis Date Noted   Post-operative wound abscess 10/21/2021   MSSA bacteremia    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)    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[place]) 10/16/2021   Postoperative wound infection 10/15/2021   Ankylosing spondylitis ([place]) 10/15/2021   Hypertension 10/15/2021   Hyperlipidemia 10/15/2021   Chronic renal insufficiency 10/15/2021   Coronary artery disease 10/15/2021   OSA (obstructive sleep apnea) 10/15/2021   Pseudoarthrosis of cervical spine ([place]) 09/20/2021   Cervical radiculopathy 12/09/2020   Bilateral inguinal hernia 03/11/2015    REFERRING DIAG: M54.12 (ICD-10-CM) - Radiculopathy, cervical region  THERAPY DIAG: Cervicalgia  Muscle weakness (generalized)   Abnormal posture   Rationale for Evaluation and Treatment Rehabilitation  PERTINENT HISTORY: Pt presents to PT s/p C5-C7 spinal fusion on 09/20/2021 with subsequent MSSA endocarditis and bacteremia infection at surgical site. He discharged from acute care on 10/28/2021 with wound vac at surgical site. He also notes significant deficits in L shoulder after a rotator cuff repair from the previous year, is L hand dominant. Had wound vac removed on 01/04/2022 and has to keep a  bandage cover on it for the next few weeks. Has some numbness in his finger tips in bilateral hands, otherwise denies any other paresthesias.   PRECAUTIONS: cervical fusion  SUBJECTIVE: Reports only mild soreness following last session,   PAIN:  Are you having pain? Yes: NPRS scale: 6/10 Pain location: neck Pain description: ache Aggravating factors: activity Relieving factors: rest   OBJECTIVE: (objective measures completed at initial evaluation unless otherwise dated)   OBJECTIVE:    DIAGNOSTIC FINDINGS:  N/A   PATIENT SURVEYS:  FOTO: 47% function; 56% predicted   COGNITION: Overall cognitive status: Within functional limits for tasks assessed   SENSATION: Decreased light touch in distal fingers bilateral UE   POSTURE:  rounded shoulders, forward head, and increased thoracic kyphosis   PALPATION: TTP to R upper trap    CERVICAL ROM:    Active ROM A/PROM (deg) eval  Flexion    Extension 10  Right lateral flexion    Left lateral flexion    Right rotation 15  Left rotation 45   (Blank rows = not tested)   UPPER EXTREMITY ROM:   AROM Right 02/16/2022 Left 02/16/2022  Shoulder flexion WNL WNL  Shoulder extension      Shoulder abduction WNL WNL  Shoulder internal rotation      Shoulder external rotation      Elbow flexion      Elbow extension      Wrist flexion      Wrist extension      (Blank rows = not tested)      UPPER EXTREMITY MMT:           MMT Right 02/16/22   Left  02/16/22   Shoulder flexion 4/5 4/5  Shoulder abduction 4/5 4/5  Shoulder ER 4/5 4/5  Shoulder IR 4/5 4/5  Middle trapezius      Lower trapezius      Shoulder extension      Grip strength      Cervical flexion (C1,C2)      Cervical S/B (C3)      Shoulder shrug (C4)      Elbow flexion (C6) 5/5 5/5  Elbow ext (C7) 5/5 5/5  Thumb ext (C8) 5/5 5/5  Finger abd (T1) 5/5 5/5  (Blank rows = not tested)   CERVICAL SPECIAL TESTS:  N/A   FUNCTIONAL TESTS:  N/A     TODAY'S  TREATMENT:  [facility] Adult PT Treatment:                                                DATE: 02/20/22 Therapeutic Exercise: UBE L1 3/3 min Row black TB 2x10  Standing shoulder extension BlaTB 2x10 Seated bilateral ER with scap retraction 10x2 GTB Cervical ext SNAG 10x Cervical rot SNAG 10x B Supine chin tuck x 5 - 5" hold Supine alternating flexion GTB 10/10 Supine retraction GTB 2x10  B  Supine retraction alternating GTB 10/10 Upper cervical extension over towel roll 10x   [facility] Adult PT Treatment:                                                DATE: 02/16/2022 Therapeutic Exercise: Row black TB x 10  Seated bilateral ER with scap retraction x 10 GTB Cervical ext SNAG with towel x 5 - 5" hold Cervical rot SNAG with towel x 5 - 5" hold Supine chin tuck x 5 - 5" hold   PATIENT EDUCATION:  Education details: eval findings, FOTO, HEP, POC Person educated: Patient Education method: Explanation, Demonstration, and Handouts Education comprehension: verbalized understanding and returned demonstration   HOME EXERCISE PROGRAM: Access Code: [email or web address] URL: [email or web address] Date: 02/16/2022 Prepared by: Octavio Manns   Exercises - Standing Shoulder Row with Anchored Resistance  - 1 x daily - 7 x weekly - 3 sets - 10 reps - Shoulder External Rotation and Scapular Retraction with Resistance  - 1 x daily - 7 x weekly - 3 sets - 10 reps - cervical extension snag with towel  - 1 x daily - 7 x weekly - 2 sets - 10 reps - 5 sec hold - Seated Assisted Cervical Rotation with Towel  - 1 x daily - 7 x weekly - 2 sets - 10 reps - 5 sec hold - Supine Chin Tuck  - 1 x daily - 7 x weekly - 2-3 sets - 10 reps - 5 sec hold   ASSESSMENT:   CLINICAL IMPRESSION:  Returns for f/u session, initially some soreness following last session, muscular in nature and has since resolved.  Todays session focused on HEP review and correction, postural retraining, periscapular strengthening.  Demoed improved  ROM in rotation and extension following session      OBJECTIVE IMPAIRMENTS decreased activity tolerance, decreased endurance, decreased mobility, difficulty walking, decreased ROM, decreased strength, hypomobility, impaired flexibility, and pain.    ACTIVITY LIMITATIONS carrying, lifting, standing, transfers, and bathing   PARTICIPATION LIMITATIONS: cleaning, laundry, occupation, and yard work   PERSONAL FACTORS Past/current experiences, Time since onset of injury/illness/exacerbation, and 3+ comorbidities: Ankylosing spondylitis, HTN, Endocarditis  are also affecting patient's functional outcome.    REHAB POTENTIAL: Good   CLINICAL DECISION MAKING: Evolving/moderate complexity   EVALUATION COMPLEXITY: Moderate     GOALS: Goals reviewed with patient? No   SHORT TERM GOALS: Target date: 03/09/2022    Pt will be compliant and knowledgeable with initial HEP for improved comfort and carryover Baseline: initial HEP given  Goal status: INITIAL   2.  Pt will self report neck pain no greater than 5/10 for improved comfort and functional ability Baseline: 7/10 at worst Goal status: INITIAL   LONG TERM GOALS: Target date: 04/13/2022   Pt will improve FOTO function score to no less than 56% as proxy for functional improvement Baseline: 47% function Goal status: INITIAL   2.  Pt will self report neck pain no greater than 2/10 for improved comfort and functional ability Baseline: 7/10 at worst Goal status: INITIAL   3.  Pt will improve R cervical rotation to no less than 30 degrees for improve comfort and functional ability Baseline: 15 deg Goal status: INITIAL   4.  Pt will improve L shoulder IR/ER to no less than 5/5 for improved functional ability to perform ADLs Baseline: 4/5  Goal status: INITIAL   PLAN: PT FREQUENCY: 2x/week   PT DURATION: 8 weeks   PLANNED INTERVENTIONS: Therapeutic exercises, Therapeutic activity, Neuromuscular re-education, Balance training, Gait  training, Patient/Family education, Joint mobilization, Electrical stimulation, Cryotherapy, Moist heat, Manual therapy, and Re-evaluation   PLAN FOR NEXT SESSION: Assess HEP response, progress DNF endurance and periscapular strength   Lanice Shirts, PT 02/20/2022, 11:34 AM

## 2022-02-20 ENCOUNTER — Ambulatory Visit: Payer: No Typology Code available for payment source

## 2022-02-20 DIAGNOSIS — M542 Cervicalgia: Secondary | ICD-10-CM

## 2022-02-20 DIAGNOSIS — M6281 Muscle weakness (generalized): Secondary | ICD-10-CM

## 2022-02-20 DIAGNOSIS — R293 Abnormal posture: Secondary | ICD-10-CM

## 2022-02-27 ENCOUNTER — Ambulatory Visit: Payer: No Typology Code available for payment source

## 2022-02-27 DIAGNOSIS — M542 Cervicalgia: Secondary | ICD-10-CM

## 2022-02-27 DIAGNOSIS — R293 Abnormal posture: Secondary | ICD-10-CM

## 2022-02-27 DIAGNOSIS — M6281 Muscle weakness (generalized): Secondary | ICD-10-CM

## 2022-02-27 NOTE — Therapy (Signed)
OUTPATIENT PHYSICAL THERAPY TREATMENT NOTE   Patient Name: Bill [name] MRN: [id number] DOB:12/17/1955, 66 y.o., male Today's Date: 02/27/2022  [facility]FERRING PROVIDER: [facility]  END OF SESSION:   PT End of Session - 02/27/22 1043     Visit Number 3    Number of Visits 15    Date for PT Re-Evaluation 04/13/22    Authorization Type [facility]    PT Start Time 1045    PT Stop Time 1125    PT Time Calculation (min) 40 min    Activity Tolerance Patient tolerated treatment well    Behavior During Therapy WFL for tasks assessed/performed              Past Medical History:  Diagnosis Date   ADHD (attention deficit hyperactivity disorder)    Anemia    Ankylosing spondylitis (HCC)    Chronic kidney disease    Coronary artery disease    Depression    Gout    Hearing loss    Hyperlipidemia    Hypertension    Morbid obesity with BMI of 40.0-44.9, adult (HCC)    Myocardial infarction (HCC)    OSA treated with BiPAP    Restrictive lung disease    Spondylarthritis    Past Surgical History:  Procedure Laterality Date   ANTERIOR CERVICAL DECOMP/DISCECTOMY FUSION N/A 12/09/2020   Procedure: Cervical Six-Seven Anterior Cervical Decompression/Discectomy Fusio;  Surgeon: Jadene Pierinistergard, Thomas A, MD;  [facility];  Service: Neurosurgery;  Laterality: N/A;  3C   ANTERIOR FUSION CERVICAL SPINE  ~ 2010   "carbon cage C3-C7"   BACK SURGERY     HERNIA REPAIR  03/11/2015   INCISIONAL HERNIA REPAIR  03/11/2015   w/mesh   INCISIONAL HERNIA REPAIR N/A 03/11/2015   Procedure: OPEN INCISIONAL HERNIA REPAIR WITH MESH ;  Surgeon: Abigail Miyamotoouglas Blackman, MD;  [facility];  Service: General;  Laterality: N/A;   INGUINAL HERNIA REPAIR Bilateral early 2000's; 03/11/2015   w/mesh; w/mesh   INGUINAL HERNIA REPAIR Bilateral 03/11/2015   Procedure: LAPAROSCOPIC CONVERTED TO BILATERAL INGUINAL HERNIA REPAIR WITH MESH;  Surgeon: Abigail Miyamotoouglas Blackman, MD;  Location: [facility] OR;   Service: General;  Laterality: Bilateral;   KNEE ARTHROSCOPY Left ~ 2009   LAPAROSCOPIC CHOLECYSTECTOMY  ~ 2012   NASAL SINUS SURGERY  2005   POSTERIOR CERVICAL FUSION/FORAMINOTOMY N/A 09/20/2021   Procedure: Cervical five to Cervical seven Posterior instrumented fusion;  Surgeon: Jadene Pierinistergard, Thomas A, MD;  Location: [facility] OR;  Service: Neurosurgery;  Laterality: N/A;   POSTERIOR CERVICAL FUSION/FORAMINOTOMY N/A 10/15/2021   Procedure: IRRIGATION AND DEBRIDEMENT OF POSTERIOR CERVICAL WOUND WITH PLACEMENT OF WOUND VAC;  Surgeon: Bedelia Personhomas, Jonathan G, MD;  Location: [facility] OR;  Service: Neurosurgery;  Laterality: N/A;   POSTERIOR LUMBAR FUSION     L4-L5 screws and rods   TONSILLECTOMY     Patient Active Problem List   Diagnosis Date Noted   Post-operative wound abscess 10/21/2021   MSSA bacteremia    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)    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   Postoperative wound infection 10/15/2021   Ankylosing spondylitis (HCC) 10/15/2021   Hypertension 10/15/2021   Hyperlipidemia 10/15/2021   Chronic renal insufficiency 10/15/2021   Coronary artery disease 10/15/2021   OSA (obstructive sleep apnea) 10/15/2021   Pseudoarthrosis of cervical spine (HCC) 09/20/2021   Cervical radiculopathy 12/09/2020   Bilateral inguinal hernia 03/11/2015    REFERRING DIAG: M54.12 (ICD-10-CM) - Radiculopathy, cervical region  THERAPY DIAG:  Cervicalgia   Muscle weakness (generalized)   Abnormal posture  Rationale for Evaluation and Treatment Rehabilitation  PERTINENT HISTORY: s/p C5-C7 spinal fusion on 09/20/2021 with subsequent MSSA endocarditis and bacteremia infection at surgical site  PRECAUTIONS: cervical fusion  SUBJECTIVE:  Pt presents to PT noting that he was upset about his last session as he has had a lot of soreness and discomfort since then. He was unable to be compliant with HEP or play pool due to pain. He would like to scale back and  decrease frequency to one visit per week.   PAIN:  Are you having pain?  Yes: NPRS scale: 6/10 Pain location: neck Pain description: ache Aggravating factors: activity Relieving factors: rest   OBJECTIVE: (objective measures completed at initial evaluation unless otherwise dated)    DIAGNOSTIC FINDINGS:  N/A   PATIENT SURVEYS:  FOTO: 47% function; 56% predicted   COGNITION: Overall cognitive status: Within functional limits for tasks assessed   SENSATION: Decreased light touch in distal fingers bilateral UE   POSTURE:  rounded shoulders, forward head, and increased thoracic kyphosis   PALPATION: TTP to R upper trap    CERVICAL ROM:    Active ROM A/PROM (deg) eval  Flexion    Extension 10  Right lateral flexion    Left lateral flexion    Right rotation 15  Left rotation 45   (Blank rows = not tested)   UPPER EXTREMITY ROM:   AROM Right 02/16/2022 Left 02/16/2022  Shoulder flexion WNL WNL  Shoulder extension      Shoulder abduction WNL WNL  Shoulder internal rotation      Shoulder external rotation      Elbow flexion      Elbow extension      Wrist flexion      Wrist extension      (Blank rows = not tested)      UPPER EXTREMITY MMT:           MMT Right 02/16/22   Left  02/16/22   Shoulder flexion 4/5 4/5  Shoulder abduction 4/5 4/5  Shoulder ER 4/5 4/5  Shoulder IR 4/5 4/5  Middle trapezius      Lower trapezius      Shoulder extension      Grip strength      Cervical flexion (C1,C2)      Cervical S/B (C3)      Shoulder shrug (C4)      Elbow flexion (C6) 5/5 5/5  Elbow ext (C7) 5/5 5/5  Thumb ext (C8) 5/5 5/5  Finger abd (T1) 5/5 5/5  (Blank rows = not tested)   CERVICAL SPECIAL TESTS:  N/A   FUNCTIONAL TESTS:  N/A     TODAY'S TREATMENT:  [facility] Adult PT Treatment:                                                DATE: 02/27/2022 Therapeutic Exercise: Supine chin tuck 2x10 - 5" hold Supine horizontal abd 2x10 RTB Seated cervical SNAG x  10 - 5"  Seated scapular retraction 2x10 - 3" Manual Therapy: STM to R upper trapezius Trigger point release to R upper trapezius  [facility] Adult PT Treatment:  DATE: 02/20/2022 Therapeutic Exercise: UBE L1 3/3 min Row black TB 2x10  Standing shoulder extension BlaTB 2x10 Seated bilateral ER with scap retraction 10x2 GTB Cervical ext SNAG 10x Cervical rot SNAG 10x B Supine chin tuck x 5 - 5" hold Supine alternating flexion GTB 10/10 Supine retraction GTB 2x10 B  Supine retraction alternating GTB 10/10 Upper cervical extension over towel roll 10x   [facility] Adult PT Treatment:                                                DATE: 02/16/2022 Therapeutic Exercise: Row black TB x 10  Seated bilateral ER with scap retraction x 10 GTB Cervical ext SNAG with towel x 5 - 5" hold Cervical rot SNAG with towel x 5 - 5" hold Supine chin tuck x 5 - 5" hold   PATIENT EDUCATION:  Education details: eval findings, FOTO, HEP, POC Person educated: Patient Education method: Explanation, Demonstration, and Handouts Education comprehension: verbalized understanding and returned demonstration   HOME EXERCISE PROGRAM: Access Code: [email or web address] URL: [email or web address] Date: 02/27/2022 Prepared by: Edwinna Areola  Exercises - Standing Shoulder Row with Anchored Resistance  - 1 x daily - 7 x weekly - 3 sets - 10 reps - Shoulder External Rotation and Scapular Retraction with Resistance  - 1 x daily - 7 x weekly - 3 sets - 10 reps - cervical extension snag with towel  - 1 x daily - 7 x weekly - 2 sets - 10 reps - 5 sec hold - Seated Assisted Cervical Rotation with Towel  - 1 x daily - 7 x weekly - 2 sets - 10 reps - 5 sec hold - Supine Chin Tuck  - 1 x daily - 7 x weekly - 2-3 sets - 10 reps - 5 sec hold - Standing Shoulder Horizontal Abduction with Resistance  - 1 x daily - 7 x weekly - 2-3 sets - 10 reps   ASSESSMENT:   CLINICAL IMPRESSION:   Pt  was able to complete prescribed exercises with no adverse effect. Noted no change in pain post manual therapy interventions. HEP updated with continued periscapular strengthening. Treatment frequency changed per pt request, with PT continuing to progress DNF and periscapular strength as able per POC.       OBJECTIVE IMPAIRMENTS decreased activity tolerance, decreased endurance, decreased mobility, difficulty walking, decreased ROM, decreased strength, hypomobility, impaired flexibility, and pain.    ACTIVITY LIMITATIONS carrying, lifting, standing, transfers, and bathing   PARTICIPATION LIMITATIONS: cleaning, laundry, occupation, and yard work   PERSONAL FACTORS Past/current experiences, Time since onset of injury/illness/exacerbation, and 3+ comorbidities: Ankylosing spondylitis, HTN, Endocarditis  are also affecting patient's functional outcome.       GOALS: Goals reviewed with patient? No   SHORT TERM GOALS: Target date: 03/09/2022    Pt will be compliant and knowledgeable with initial HEP for improved comfort and carryover Baseline: initial HEP given  Goal status: INITIAL   2.  Pt will self report neck pain no greater than 5/10 for improved comfort and functional ability Baseline: 7/10 at worst Goal status: INITIAL   LONG TERM GOALS: Target date: 04/13/2022   Pt will improve FOTO function score to no less than 56% as proxy for functional improvement Baseline: 47% function Goal status: INITIAL   2.  Pt will self report neck pain no greater than 2/10 for  improved comfort and functional ability Baseline: 7/10 at worst Goal status: INITIAL   3.  Pt will improve R cervical rotation to no less than 30 degrees for improve comfort and functional ability Baseline: 15 deg Goal status: INITIAL   4.  Pt will improve L shoulder IR/ER to no less than 5/5 for improved functional ability to perform ADLs Baseline: 4/5 Goal status: INITIAL   PLAN: PT FREQUENCY: 2x/week   PT DURATION:  8 weeks   PLANNED INTERVENTIONS: Therapeutic exercises, Therapeutic activity, Neuromuscular re-education, Balance training, Gait training, Patient/Family education, Joint mobilization, Electrical stimulation, Cryotherapy, Moist heat, Manual therapy, and Re-evaluation   PLAN FOR NEXT SESSION: Assess HEP response, progress DNF endurance and periscapular strength   Eloy End, PT 02/27/2022, 12:07 PM

## 2022-03-02 ENCOUNTER — Ambulatory Visit: Payer: No Typology Code available for payment source

## 2022-03-06 ENCOUNTER — Ambulatory Visit: Payer: No Typology Code available for payment source

## 2022-03-13 ENCOUNTER — Ambulatory Visit: Payer: No Typology Code available for payment source

## 2022-03-13 DIAGNOSIS — M542 Cervicalgia: Secondary | ICD-10-CM | POA: Diagnosis not present

## 2022-03-13 DIAGNOSIS — R293 Abnormal posture: Secondary | ICD-10-CM

## 2022-03-13 DIAGNOSIS — M6281 Muscle weakness (generalized): Secondary | ICD-10-CM

## 2022-03-13 NOTE — Therapy (Addendum)
OUTPATIENT PHYSICAL THERAPY TREATMENT NOTE/DISCHARGE  PHYSICAL THERAPY DISCHARGE SUMMARY  Visits from Start of Care: 4  Current functional level related to goals / functional outcomes: Unable to assess   Remaining deficits: Unable to assess   Education / Equipment: N/A   Patient agrees to discharge. Patient goals were  unable to assess . Patient is being discharged due to not returning since the last visit.   Patient Name: Bill [name] MRN: [id number] DOB:1956-08-22, 66 y.o., male Today's Date: 03/13/2022  [facility]: [facility] REFERRING PROVIDER: [facility]  END OF SESSION:   PT End of Session - 03/13/22 1039     Visit Number 4    Number of Visits 15    Date for PT Re-Evaluation 04/13/22    Authorization Type [facility]    PT Start Time 1045    PT Stop Time 1123    PT Time Calculation (min) 38 min    Activity Tolerance Patient tolerated treatment well    Behavior During Therapy WFL for tasks assessed/performed               Past Medical History:  Diagnosis Date   ADHD (attention deficit hyperactivity disorder)    Anemia    Ankylosing spondylitis ([place])    Chronic kidney disease    Coronary artery disease    Depression    Gout    Hearing loss    Hyperlipidemia    Hypertension    Morbid obesity with BMI of 40.0-44.9, adult ([place])    Myocardial infarction ([place])    OSA treated with BiPAP    Restrictive lung disease    Spondylarthritis    Past Surgical History:  Procedure Laterality Date   ANTERIOR CERVICAL DECOMP/DISCECTOMY FUSION N/A 12/09/2020   Procedure: Cervical Six-Seven Anterior Cervical Decompression/Discectomy Fusio;  Surgeon: Judith Part, MD;  Location: [place];  Service: Neurosurgery;  Laterality: N/A;  3C   ANTERIOR FUSION CERVICAL SPINE  ~ 2010   "carbon cage C3-C7"   BACK SURGERY     HERNIA REPAIR  03/11/2015   INCISIONAL HERNIA REPAIR  03/11/2015   w/mesh   INCISIONAL HERNIA REPAIR N/A 03/11/2015   Procedure: OPEN  INCISIONAL HERNIA REPAIR WITH MESH ;  Surgeon: Coralie Keens, MD;  Location: [place];  Service: General;  Laterality: N/A;   INGUINAL HERNIA REPAIR Bilateral early 2000's; 03/11/2015   w/mesh; w/mesh   INGUINAL HERNIA REPAIR Bilateral 03/11/2015   Procedure: LAPAROSCOPIC CONVERTED TO BILATERAL INGUINAL HERNIA REPAIR WITH MESH;  Surgeon: Coralie Keens, MD;  Location: [place];  Service: General;  Laterality: Bilateral;   KNEE ARTHROSCOPY Left ~ 2009   LAPAROSCOPIC CHOLECYSTECTOMY  ~ 2012   NASAL SINUS SURGERY  2005   POSTERIOR CERVICAL FUSION/FORAMINOTOMY N/A 09/20/2021   Procedure: Cervical five to Cervical seven Posterior instrumented fusion;  Surgeon: Judith Part, MD;  Location: [place];  Service: Neurosurgery;  Laterality: N/A;   POSTERIOR CERVICAL FUSION/FORAMINOTOMY N/A 10/15/2021   Procedure: IRRIGATION AND DEBRIDEMENT OF POSTERIOR CERVICAL WOUND WITH PLACEMENT OF WOUND VAC;  Surgeon: Vallarie Mare, MD;  Location: [place];  Service: Neurosurgery;  Laterality: N/A;   POSTERIOR LUMBAR FUSION     L4-L5 screws and rods   TONSILLECTOMY     Patient Active Problem List   Diagnosis Date Noted   Post-operative wound abscess 10/21/2021   MSSA bacteremia    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)    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[place]) 10/16/2021   Postoperative wound infection 10/15/2021  Ankylosing spondylitis ([place]) 10/15/2021   Hypertension 10/15/2021   Hyperlipidemia 10/15/2021   Chronic renal insufficiency 10/15/2021   Coronary artery disease 10/15/2021   OSA (obstructive sleep apnea) 10/15/2021   Pseudoarthrosis of cervical spine ([place]) 09/20/2021   Cervical radiculopathy 12/09/2020   Bilateral inguinal hernia 03/11/2015    REFERRING DIAG: M54.12 (ICD-10-CM) - Radiculopathy, cervical region  THERAPY DIAG: Cervicalgia   Muscle weakness (generalized)   Abnormal posture  Rationale for Evaluation and Treatment Rehabilitation  PERTINENT HISTORY: s/p  C5-C7 spinal fusion on 09/20/2021 with subsequent MSSA endocarditis and bacteremia infection at surgical site  PRECAUTIONS: cervical fusion  SUBJECTIVE:  Pt presents to PT with continued pain in neck and L shoulder. Has been fairly compliant with HEP with no adverse effect. Pt is ready to begin PT at this time.   PAIN:  Are you having pain?  Yes: NPRS scale: 4/10 Pain location: neck Pain description: ache Aggravating factors: activity Relieving factors: rest   OBJECTIVE: (objective measures completed at initial evaluation unless otherwise dated)    DIAGNOSTIC FINDINGS:  N/A   PATIENT SURVEYS:  FOTO: 47% function; 56% predicted   COGNITION: Overall cognitive status: Within functional limits for tasks assessed   SENSATION: Decreased light touch in distal fingers bilateral UE   POSTURE:  rounded shoulders, forward head, and increased thoracic kyphosis   PALPATION: TTP to R upper trap    CERVICAL ROM:    Active ROM A/PROM (deg) eval  Flexion    Extension 10  Right lateral flexion    Left lateral flexion    Right rotation 15  Left rotation 45   (Blank rows = not tested)   UPPER EXTREMITY ROM:   AROM Right 02/16/2022 Left 02/16/2022  Shoulder flexion WNL WNL  Shoulder extension      Shoulder abduction WNL WNL  Shoulder internal rotation      Shoulder external rotation      Elbow flexion      Elbow extension      Wrist flexion      Wrist extension      (Blank rows = not tested)      UPPER EXTREMITY MMT:           MMT Right 02/16/22   Left  02/16/22   Shoulder flexion 4/5 4/5  Shoulder abduction 4/5 4/5  Shoulder ER 4/5 4/5  Shoulder IR 4/5 4/5  Middle trapezius      Lower trapezius      Shoulder extension      Grip strength      Cervical flexion (C1,C2)      Cervical S/B (C3)      Shoulder shrug (C4)      Elbow flexion (C6) 5/5 5/5  Elbow ext (C7) 5/5 5/5  Thumb ext (C8) 5/5 5/5  Finger abd (T1) 5/5 5/5  (Blank rows = not tested)   CERVICAL  SPECIAL TESTS:  N/A   FUNCTIONAL TESTS:  N/A     TODAY'S TREATMENT:  [facility] Adult PT Treatment:                                                DATE: 03/13/2022 Therapeutic Exercise: Upper trap stretch 2x20" each Seated cervical SNAG rot x 10 - 5" each Seated cervical SNAG ext x 10 - 5" hold Seated bilateral ER 2x10 GTB Seated horizontal abd 2x10 GTB  Standing chin tuck x 10 Standing chin tuck with ball x 10 - 5" hold Standing row 2x10 13# Shoulder ext 2x10 13# Seated T x 10  [facility] Adult PT Treatment:                                                DATE: 02/27/2022 Therapeutic Exercise: Supine chin tuck 2x10 - 5" hold Supine horizontal abd 2x10 RTB Seated cervical SNAG x 10 - 5"  Seated scapular retraction 2x10 - 3" Manual Therapy: STM to R upper trapezius Trigger point release to R upper trapezius  [facility] Adult PT Treatment:                                                DATE: 02/20/2022 Therapeutic Exercise: UBE L1 3/3 min Row black TB 2x10  Standing shoulder extension BlaTB 2x10 Seated bilateral ER with scap retraction 10x2 GTB Cervical ext SNAG 10x Cervical rot SNAG 10x B Supine chin tuck x 5 - 5" hold Supine alternating flexion GTB 10/10 Supine retraction GTB 2x10 B  Supine retraction alternating GTB 10/10 Upper cervical extension over towel roll 10x   PATIENT EDUCATION:  Education details: eval findings, FOTO, HEP, POC Person educated: Patient Education method: Explanation, Demonstration, and Handouts Education comprehension: verbalized understanding and returned demonstration   HOME EXERCISE PROGRAM: Access Code: [email or web address] URL: [email or web address] Date: 03/13/2022 Prepared by: Octavio Manns  Exercises - Standing Shoulder Row with Anchored Resistance  - 1 x daily - 7 x weekly - 3 sets - 10 reps - Shoulder External Rotation and Scapular Retraction with Resistance  - 1 x daily - 7 x weekly - 3 sets - 10 reps - cervical extension snag with towel  - 1 x  daily - 7 x weekly - 2 sets - 10 reps - 5 sec hold - Seated Assisted Cervical Rotation with Towel  - 1 x daily - 7 x weekly - 2 sets - 10 reps - 5 sec hold - Supine Chin Tuck  - 1 x daily - 7 x weekly - 2-3 sets - 10 reps - 5 sec hold - Standing Shoulder Horizontal Abduction with Resistance  - 1 x daily - 7 x weekly - 2-3 sets - 10 reps - Standing Isometric Cervical Retraction with Chin Tucks and Ball at [employer]  - 1 x daily - 7 x weekly - 2 sets - 10 reps - 5 sec hold   ASSESSMENT:   CLINICAL IMPRESSION:   Pt was able to complete all prescribed exercises with no adverse effect or increase in pain. Therapy today continued to focus on improving DNF and periscapular strengthening in order to decrease pain and improve function. He continues to benefit from skilled PT and will continue to be seen and progressed as able per POC.     OBJECTIVE IMPAIRMENTS decreased activity tolerance, decreased endurance, decreased mobility, difficulty walking, decreased ROM, decreased strength, hypomobility, impaired flexibility, and pain.    ACTIVITY LIMITATIONS carrying, lifting, standing, transfers, and bathing   PARTICIPATION LIMITATIONS: cleaning, laundry, occupation, and yard work   PERSONAL FACTORS Past/current experiences, Time since onset of injury/illness/exacerbation, and 3+ comorbidities: Ankylosing spondylitis, HTN, Endocarditis  are also affecting patient's functional outcome.  GOALS: Goals reviewed with patient? No   SHORT TERM GOALS: Target date: 03/09/2022    Pt will be compliant and knowledgeable with initial HEP for improved comfort and carryover Baseline: initial HEP given  Goal status: MET   2.  Pt will self report neck pain no greater than 5/10 for improved comfort and functional ability Baseline: 7/10 at worst Goal status: ONGOING   LONG TERM GOALS: Target date: 04/13/2022   Pt will improve FOTO function score to no less than 56% as proxy for functional improvement Baseline: 47%  function Goal status: INITIAL   2.  Pt will self report neck pain no greater than 2/10 for improved comfort and functional ability Baseline: 7/10 at worst Goal status: INITIAL   3.  Pt will improve R cervical rotation to no less than 30 degrees for improve comfort and functional ability Baseline: 15 deg Goal status: INITIAL   4.  Pt will improve L shoulder IR/ER to no less than 5/5 for improved functional ability to perform ADLs Baseline: 4/5 Goal status: INITIAL   PLAN: PT FREQUENCY: 2x/week   PT DURATION: 8 weeks   PLANNED INTERVENTIONS: Therapeutic exercises, Therapeutic activity, Neuromuscular re-education, Balance training, Gait training, Patient/Family education, Joint mobilization, Electrical stimulation, Cryotherapy, Moist heat, Manual therapy, and Re-evaluation   PLAN FOR NEXT SESSION: Assess HEP response, progress DNF endurance and periscapular strength   Ward Chatters, PT 03/13/2022, 11:25 AM

## 2022-03-28 ENCOUNTER — Ambulatory Visit: Payer: No Typology Code available for payment source | Attending: Neurological Surgery

## 2022-04-04 ENCOUNTER — Ambulatory Visit: Payer: No Typology Code available for payment source

## 2022-04-13 ENCOUNTER — Ambulatory Visit: Payer: No Typology Code available for payment source

## 2022-06-07 ENCOUNTER — Telehealth (HOSPITAL_COMMUNITY): Payer: Self-pay

## 2022-06-07 ENCOUNTER — Encounter (HOSPITAL_COMMUNITY): Payer: Self-pay

## 2022-06-07 NOTE — Telephone Encounter (Signed)
Attempted to call patient in regards to Cardiac Rehab - unable to leave VM.   Mailed letter 

## 2022-06-07 NOTE — Telephone Encounter (Signed)
Outside/paper referral received by Dr. Rosaria Ferries from the [facility]. Insurance benefits and eligibility to be determined.

## 2022-06-22 ENCOUNTER — Telehealth (HOSPITAL_COMMUNITY): Payer: Self-pay

## 2022-06-22 NOTE — Telephone Encounter (Signed)
Called pt to confirm appt. Left a message with department number.

## 2022-06-23 ENCOUNTER — Encounter (HOSPITAL_COMMUNITY)
Admission: RE | Admit: 2022-06-23 | Discharge: 2022-06-23 | Disposition: A | Discharge status: Home / Self Care | Payer: No Typology Code available for payment source | Source: Ambulatory Visit | Attending: Pulmonary Disease | Admitting: Pulmonary Disease

## 2022-06-23 ENCOUNTER — Telehealth (HOSPITAL_COMMUNITY): Payer: Self-pay

## 2022-06-23 DIAGNOSIS — R0609 Other forms of dyspnea: Secondary | ICD-10-CM | POA: Insufficient documentation

## 2022-06-23 NOTE — Telephone Encounter (Signed)
Returned call from pt. Pt confirmed appt. Gave directions to the department. Pt voiced understanding.

## 2022-06-23 NOTE — Progress Notes (Signed)
Bill [name] arrived today for Pulmonary Rehab orientation. He was referred by Bill [name] at the [place]. Bill [name] mentioned to me that his doctor told him this program would have exercise for long-hauler COVID patients. I told Bill [name] that the exercise was tailored for all pulmonary patients. Bill [name] does not want to participate in the program because he can "exercise at home on his own." Bill [name] decided to drop form the program. Will close his chart up and cancel all future appts. Bill [name] voiced understanding.

## 2022-06-29 ENCOUNTER — Ambulatory Visit (HOSPITAL_COMMUNITY): Payer: No Typology Code available for payment source

## 2022-07-04 ENCOUNTER — Ambulatory Visit (HOSPITAL_COMMUNITY): Payer: No Typology Code available for payment source

## 2022-07-06 ENCOUNTER — Ambulatory Visit (HOSPITAL_COMMUNITY): Payer: No Typology Code available for payment source

## 2022-07-11 ENCOUNTER — Ambulatory Visit (HOSPITAL_COMMUNITY): Payer: No Typology Code available for payment source

## 2022-07-13 ENCOUNTER — Ambulatory Visit (HOSPITAL_COMMUNITY): Payer: No Typology Code available for payment source

## 2022-07-18 ENCOUNTER — Ambulatory Visit (HOSPITAL_COMMUNITY): Payer: No Typology Code available for payment source

## 2022-07-20 ENCOUNTER — Ambulatory Visit (HOSPITAL_COMMUNITY): Payer: No Typology Code available for payment source

## 2022-07-25 ENCOUNTER — Ambulatory Visit (HOSPITAL_COMMUNITY): Payer: No Typology Code available for payment source

## 2022-07-27 ENCOUNTER — Ambulatory Visit (HOSPITAL_COMMUNITY): Payer: No Typology Code available for payment source

## 2022-08-01 ENCOUNTER — Ambulatory Visit (HOSPITAL_COMMUNITY): Payer: No Typology Code available for payment source

## 2022-08-03 ENCOUNTER — Ambulatory Visit (HOSPITAL_COMMUNITY): Payer: No Typology Code available for payment source

## 2022-08-08 ENCOUNTER — Ambulatory Visit (HOSPITAL_COMMUNITY): Payer: No Typology Code available for payment source

## 2022-08-15 ENCOUNTER — Ambulatory Visit (HOSPITAL_COMMUNITY): Payer: No Typology Code available for payment source

## 2022-08-17 ENCOUNTER — Ambulatory Visit (HOSPITAL_COMMUNITY): Payer: No Typology Code available for payment source

## 2022-08-22 ENCOUNTER — Ambulatory Visit (HOSPITAL_COMMUNITY): Payer: No Typology Code available for payment source

## 2022-08-24 ENCOUNTER — Ambulatory Visit (HOSPITAL_COMMUNITY): Payer: No Typology Code available for payment source

## 2022-08-29 ENCOUNTER — Ambulatory Visit (HOSPITAL_COMMUNITY): Payer: No Typology Code available for payment source

## 2022-08-31 ENCOUNTER — Ambulatory Visit (HOSPITAL_COMMUNITY): Payer: No Typology Code available for payment source

## 2023-03-27 ENCOUNTER — Other Ambulatory Visit (HOSPITAL_COMMUNITY): Payer: Self-pay | Admitting: Internal Medicine

## 2023-03-27 DIAGNOSIS — M25561 Pain in right knee: Secondary | ICD-10-CM

## 2023-04-02 ENCOUNTER — Ambulatory Visit (HOSPITAL_COMMUNITY)
Admission: RE | Admit: 2023-04-02 | Discharge: 2023-04-02 | Disposition: A | Discharge status: Home / Self Care | Payer: No Typology Code available for payment source | Source: Ambulatory Visit | Attending: Internal Medicine | Admitting: Internal Medicine

## 2023-04-02 DIAGNOSIS — M25561 Pain in right knee: Secondary | ICD-10-CM | POA: Insufficient documentation

## 2023-07-09 ENCOUNTER — Other Ambulatory Visit: Payer: Self-pay | Admitting: Physician Assistant

## 2023-07-09 DIAGNOSIS — M5412 Radiculopathy, cervical region: Secondary | ICD-10-CM

## 2023-07-24 ENCOUNTER — Encounter: Payer: Self-pay | Admitting: Physician Assistant

## 2023-07-30 ENCOUNTER — Other Ambulatory Visit: Payer: Medicare HMO

## 2023-08-18 ENCOUNTER — Ambulatory Visit
Admission: RE | Admit: 2023-08-18 | Discharge: 2023-08-18 | Disposition: A | Discharge status: Home / Self Care | Payer: Medicare HMO | Source: Ambulatory Visit | Attending: Physician Assistant | Admitting: Physician Assistant

## 2023-08-18 DIAGNOSIS — M5412 Radiculopathy, cervical region: Secondary | ICD-10-CM

## 2023-10-03 ENCOUNTER — Other Ambulatory Visit: Payer: Self-pay | Admitting: Neurosurgery

## 2023-10-03 DIAGNOSIS — M40292 Other kyphosis, cervical region: Secondary | ICD-10-CM

## 2023-10-25 ENCOUNTER — Other Ambulatory Visit: Payer: Medicare HMO

## 2023-10-31 ENCOUNTER — Ambulatory Visit
Admission: RE | Admit: 2023-10-31 | Discharge: 2023-10-31 | Disposition: A | Discharge status: Home / Self Care | Payer: No Typology Code available for payment source | Source: Ambulatory Visit | Attending: Neurosurgery

## 2023-10-31 ENCOUNTER — Ambulatory Visit
Admission: RE | Admit: 2023-10-31 | Discharge: 2023-10-31 | Disposition: A | Discharge status: Home / Self Care | Payer: No Typology Code available for payment source | Source: Ambulatory Visit | Attending: Neurosurgery | Admitting: Neurosurgery

## 2023-10-31 DIAGNOSIS — M40292 Other kyphosis, cervical region: Secondary | ICD-10-CM
